# Patient Record
Sex: Female | Born: 1950 | Race: White | Marital: Married | Smoking: Never smoker
Health system: Southern US, Community
[De-identification: ages and names within clinical notes are randomized; demographics above are authoritative.]

## PROBLEM LIST (undated history)

## (undated) DIAGNOSIS — M810 Age-related osteoporosis without current pathological fracture: Secondary | ICD-10-CM

## (undated) DIAGNOSIS — F329 Major depressive disorder, single episode, unspecified: Secondary | ICD-10-CM

## (undated) DIAGNOSIS — R7303 Prediabetes: Secondary | ICD-10-CM

## (undated) DIAGNOSIS — E785 Hyperlipidemia, unspecified: Secondary | ICD-10-CM

## (undated) DIAGNOSIS — F5104 Psychophysiologic insomnia: Secondary | ICD-10-CM

## (undated) DIAGNOSIS — F32A Depression, unspecified: Secondary | ICD-10-CM

## (undated) DIAGNOSIS — K219 Gastro-esophageal reflux disease without esophagitis: Secondary | ICD-10-CM

## (undated) HISTORY — PX: TONSILLECTOMY: SUR1361

## (undated) HISTORY — DX: Gastro-esophageal reflux disease without esophagitis: K21.9

## (undated) HISTORY — DX: Hyperlipidemia, unspecified: E78.5

## (undated) HISTORY — DX: Prediabetes: R73.03

## (undated) HISTORY — DX: Age-related osteoporosis without current pathological fracture: M81.0

## (undated) HISTORY — DX: Psychophysiologic insomnia: F51.04

## (undated) HISTORY — DX: Major depressive disorder, single episode, unspecified: F32.9

## (undated) HISTORY — DX: Depression, unspecified: F32.A

---

## 1975-01-05 HISTORY — PX: TUBAL LIGATION: SHX77

## 1997-01-04 HISTORY — PX: CHOLECYSTECTOMY: SHX55

## 2003-01-05 HISTORY — PX: SHOULDER SURGERY: SHX246

## 2016-05-26 LAB — COLOGUARD: Cologuard: NEGATIVE

## 2016-08-05 DIAGNOSIS — J018 Other acute sinusitis: Secondary | ICD-10-CM | POA: Diagnosis not present

## 2016-08-13 DIAGNOSIS — K219 Gastro-esophageal reflux disease without esophagitis: Secondary | ICD-10-CM | POA: Diagnosis not present

## 2016-08-13 DIAGNOSIS — F331 Major depressive disorder, recurrent, moderate: Secondary | ICD-10-CM | POA: Diagnosis not present

## 2016-09-15 DIAGNOSIS — J018 Other acute sinusitis: Secondary | ICD-10-CM | POA: Diagnosis not present

## 2016-11-05 DIAGNOSIS — R7301 Impaired fasting glucose: Secondary | ICD-10-CM | POA: Diagnosis not present

## 2016-11-05 DIAGNOSIS — E782 Mixed hyperlipidemia: Secondary | ICD-10-CM | POA: Diagnosis not present

## 2016-11-05 DIAGNOSIS — K219 Gastro-esophageal reflux disease without esophagitis: Secondary | ICD-10-CM | POA: Diagnosis not present

## 2016-11-05 DIAGNOSIS — F331 Major depressive disorder, recurrent, moderate: Secondary | ICD-10-CM | POA: Diagnosis not present

## 2016-11-05 DIAGNOSIS — R072 Precordial pain: Secondary | ICD-10-CM | POA: Diagnosis not present

## 2016-11-17 ENCOUNTER — Ambulatory Visit (INDEPENDENT_AMBULATORY_CARE_PROVIDER_SITE_OTHER): Payer: PPO | Admitting: Cardiology

## 2016-11-17 ENCOUNTER — Encounter (INDEPENDENT_AMBULATORY_CARE_PROVIDER_SITE_OTHER): Payer: Self-pay

## 2016-11-17 ENCOUNTER — Encounter: Payer: Self-pay | Admitting: Cardiology

## 2016-11-17 VITALS — BP 122/78 | HR 66 | Ht 67.0 in | Wt 158.0 lb

## 2016-11-17 DIAGNOSIS — E782 Mixed hyperlipidemia: Secondary | ICD-10-CM | POA: Diagnosis not present

## 2016-11-17 DIAGNOSIS — Z01818 Encounter for other preprocedural examination: Secondary | ICD-10-CM

## 2016-11-17 DIAGNOSIS — R072 Precordial pain: Secondary | ICD-10-CM

## 2016-11-17 DIAGNOSIS — R079 Chest pain, unspecified: Secondary | ICD-10-CM

## 2016-11-17 HISTORY — DX: Precordial pain: R07.2

## 2016-11-17 MED ORDER — METOPROLOL TARTRATE 50 MG PO TABS: 50.0000 mg | ORAL_TABLET | Freq: Once | ORAL | 0 refills | Status: DC

## 2016-11-17 NOTE — Progress Notes (Signed)
PCP: Blane Ohara, MD  Clinic Note: Chief Complaint  Patient presents with  . New Patient (Initial Visit)    Chest pain    HPI: Virginia Montoya is a 66 y.o. female who is being seen today for the evaluation of Chest Pain at the request of Gus Height, [place]. She has a family history notable for mother with diabetes and hypercholesterol and maternal grandmother with diabetes  [name] was seen by Denice Paradise, PA for Dr. Sedalia Muta.  She noted some episodes of chest pain that woke her up at night.  She is now referred for evaluation.  Recent Hospitalizations: None  Studies Personally Reviewed - (if available, images/films reviewed: From Epic Chart or Care Everywhere)  None  Interval History: [name] presents here today with somewhat interesting symptoms of chest discomfort with 2 notable occasions, but otherwise has had some intermittent symptoms. Her initial episode was a couple weeks ago she woke up at night and with a dull ache in her chest that was about 6-7 out of 10.  The ache rate radiated into her throat and jaw.  She said she took aspirin at the time and was able to go back to sleep.  It took about 15 minutes for symptoms to fully resolve.  The next day she woke up and his symptoms came back and this time she felt the episode occurring where she felt a warm and aching sensation in her chest and it did not go to the jaw.  She has noted intermittent episodes since then that often occur at rest but can occur occur with exertion as well.  She does not necessarily notice that they get worse with exertion or walking.  She says that sometimes the symptoms can go on all day waxing and waning, but has been pretty much present throughout never fully going away.  She says that aspirin helps some, not always. It is occasionally associated with shortness of breath, but not always. She occasionally feels some irregular heartbeats with the symptoms but not otherwise. She does indicate that she has  had a significant anxiety issues and was recently started back on her Celexa. A lot of these episodes of stress and anxiety occurred while her grandkids were living with her, now they are no longer there.  Basically with her grandchildren and their young children.  That caused a lot of social stress for her.  Now that they are no longer in the house, she is feeling better.   No PND, orthopnea or edema. No palpitations, lightheadedness, dizziness, weakness or syncope/near syncope. No TIA/amaurosis fugax symptoms. No melena, hematochezia, hematuria, or epstaxis. No claudication.  ROS: A comprehensive was performed. Review of Systems  Constitutional: Negative for malaise/fatigue.  HENT: Negative for congestion.   Respiratory: Negative for cough and shortness of breath.   Cardiovascular: Positive for leg swelling (Mild swelling with mild varicose veins).  Musculoskeletal: Positive for joint pain (Routine arthritis pains).  Neurological: Negative for dizziness and loss of consciousness.  Endo/Heme/Allergies: Negative for environmental allergies.  Psychiatric/Behavioral: The patient is nervous/anxious.        Per HPI  All other systems reviewed and are negative.  I have reviewed and (if needed) personally updated the patient's problem list, medications, allergies, past medical and surgical history, social and family history.   Past Medical History:  Diagnosis Date  . Hyperlipidemia     Past Surgical History:  Procedure Laterality Date  . CHOLECYSTECTOMY  1999  . SHOULDER SURGERY  2005  .  TONSILLECTOMY    . TUBAL LIGATION  1977    Current Meds  Medication Sig  . acetaminophen (TYLENOL) 500 MG tablet Take 500 mg every 6 (six) hours as needed by mouth.  Marland Kitchen. aspirin 325 MG EC tablet Take 325 mg as needed by mouth for pain.  Marland Kitchen. atorvastatin (LIPITOR) 10 MG tablet Take 10 mg daily at 6 PM by mouth.   . citalopram (CELEXA) 20 MG tablet take one tablet by mouth daily  . ibuprofen  (ADVIL,MOTRIN) 200 MG tablet Take 200 mg every 6 (six) hours as needed by mouth.  Marland Kitchen. LORazepam (ATIVAN) 0.5 MG tablet Take 0.5 mg at bedtime by mouth.   . zolpidem (AMBIEN) 10 MG tablet take one tablet by mouth at bedtime    Allergies  Allergen Reactions  . Cephalexin Nausea And Vomiting    ask    Social History   Socioeconomic History  . Marital status: Married    Spouse name: None  . Number of children: 3  . Years of education: 2912  . Highest education level: Associate degree: academic program  Social Needs  . Financial resource strain: None  . Food insecurity - worry: None  . Food insecurity - inability: None  . Transportation needs - medical: None  . Transportation needs - non-medical: None  Occupational History  . None  Tobacco Use  . Smoking status: Never Smoker  . Smokeless tobacco: Never Used  Substance and Sexual Activity  . Alcohol use: No    Frequency: Never  . Drug use: No  . Sexual activity: None  Other Topics Concern  . None  Social History Narrative   She is a married (remarried) mother of 3 with 4 grandchildren.  She has 3 great-grandchildren.  She previously worked at [employer] in the [employer] department.  She completed high school and beauty school.   She works outside a lot in the yard on gardening.  She does a lot of walking and exercises with walking for at least a half an hour a day 5 days a week.    family history includes Diabetes in her maternal grandmother and mother; Emphysema in her father; Hyperlipidemia in her mother; Liver cancer in her maternal grandmother.  Wt Readings from Last 3 Encounters:  11/17/16 158 lb (71.7 kg)    PHYSICAL EXAM BP 122/78   Pulse 66   Ht 5\' 7"  (1.702 m)   Wt 158 lb (71.7 kg)   BMI 24.75 kg/m  Physical Exam    Adult ECG Report  Rate: 66;  Rhythm: normal sinus rhythm and Nonspecific ST and T wave changes.  Otherwise normal axis, intervals and durations.;   Narrative Interpretation: Essentially normal  EKG   Other studies Reviewed: Additional studies/ records that were reviewed today include:  Recent Labs: None available  ASSESSMENT / PLAN: Problem List Items Addressed This Visit    Chest pain with moderate risk for cardiac etiology - Primary    [place] SinkRita presents here with symptoms that are somewhat typical but otherwise atypical for angina.  The fact that they awoke her from sleep and then have occurred since it is somewhat concerning.  We discussed various options for ischemic evaluation including stress testing versus coronary CTA.  Based on her body habitus I think on a CTA may be a better study. Plan: Coronary Calcium Score followed by Coronary CTA and possible CT FFR.  This will allow us to confirm an anatomical  evaluation and also potentially evaluate physiologic evidence for ischemia.Marland Kitchen.  -  She is on aspirin as well as atorvastatin. With normal blood pressure, will hold off on considering beta-blocker or other antianginals until we see the results of her studies.      Relevant Orders   EKG 12-Lead (Completed)   CT CORONARY MORPH W/CTA COR W/SCORE W/CA W/CM &/OR WO/CM   CT CORONARY FRACTIONAL FLOW RESERVE DATA PREP   CT CORONARY FRACTIONAL FLOW RESERVE FLUID ANALYSIS   Basic metabolic panel   Hyperlipidemia, mixed (Chronic)    I do not have labs available.  She is on atorvastatin 10 mg daily.      Relevant Medications   atorvastatin (LIPITOR) 10 MG tablet   aspirin 325 MG EC tablet    Other Visit Diagnoses    Pre-op testing       Relevant Orders   Basic metabolic panel      Current medicines are reviewed at length with the patient today. (+/- concerns) none The following changes have been made:None  Patient Instructions  No medication changes    SCHEDULE AT [facility]  Your physician has requested that you have cardiac CT. Cardiac computed tomography (CT) is a painless test that uses an x-ray machine to take clear, detailed pictures of your heart. For further  information please visit [email or web address]. Please follow instruction sheet as given. PLEASE HAVE LABS- BMP AT LEAST ONE WEEK BEFORE CARDIAC TEST AND PICK UP METOPROLOL FROM PHARMACY.   Your physician recommends that you schedule a follow-up appointment in 2 MONTHS WITH DR Apryll Hinkle.        Please arrive at the [facility] main entrance of [facility] (30-45 minutes prior to test start time)  [facility] [address] [place], [place] [address] [phone]  Proceed to the [facility] Radiology Department (First Floor).  Please follow these instructions carefully (unless otherwise directed):    On the Night Before the Test: . Drink plenty of water. . Do not consume any caffeinated/decaffeinated beverages or chocolate 12 hours prior to your test. . Do not take any antihistamines 12 hours prior to your test.   On the Day of the Test: . Drink plenty of water. Do not drink any water within one hour of the test. . Do not eat any food 4 hours prior to the test. . You may take your regular medications prior to the test. . IF NOT ON A BETA BLOCKER - Take 50 mg of lopressor (metoprolol) one hour before the test.   After the Test: . Drink plenty of water. . After receiving IV contrast, you may experience a mild flushed feeling. This is normal. . On occasion, you may experience a mild rash up to 24 hours after the test. This is not dangerous. If this occurs, you can take Benadryl 25 mg and increase your fluid intake. . If you experience trouble breathing, this can be serious. If it is severe call 911 IMMEDIATELY. If it is mild, please call our office.             Studies Ordered:   Orders Placed This Encounter  Procedures  . CT CORONARY MORPH W/CTA COR W/SCORE W/CA W/CM &/OR WO/CM  . CT CORONARY FRACTIONAL FLOW RESERVE DATA PREP  . CT CORONARY FRACTIONAL FLOW RESERVE FLUID ANALYSIS  . Basic metabolic panel  . EKG 12-Lead      Bryan Lemmaavid  Audon Heymann, M.D., M.S. Interventional Cardiologist   Pager # [phone] Phone # [phone] [address] [place], [place] [address]

## 2016-11-17 NOTE — Patient Instructions (Addendum)
No medication changes    SCHEDULE AT [facility]  Your physician has requested that you have cardiac CT. Cardiac computed tomography (CT) is a painless test that uses an x-ray machine to take clear, detailed pictures of your heart. For further information please visit [email or web address]. Please follow instruction sheet as given. PLEASE HAVE LABS- BMP AT LEAST ONE WEEK BEFORE CARDIAC TEST AND PICK UP METOPROLOL FROM PHARMACY.   Your physician recommends that you schedule a follow-up appointment in 2 MONTHS WITH DR HARDING.        Please arrive at the [facility] main entrance of [facility] (30-45 minutes prior to test start time)  [facility] [address] [place], [place] [address] [phone]  Proceed to the [facility] Radiology Department (First Floor).  Please follow these instructions carefully (unless otherwise directed):    On the Night Before the Test: . Drink plenty of water. . Do not consume any caffeinated/decaffeinated beverages or chocolate 12 hours prior to your test. . Do not take any antihistamines 12 hours prior to your test.   On the Day of the Test: . Drink plenty of water. Do not drink any water within one hour of the test. . Do not eat any food 4 hours prior to the test. . You may take your regular medications prior to the test. . IF NOT ON A BETA BLOCKER - Take 50 mg of lopressor (metoprolol) one hour before the test.   After the Test: . Drink plenty of water. . After receiving IV contrast, you may experience a mild flushed feeling. This is normal. . On occasion, you may experience a mild rash up to 24 hours after the test. This is not dangerous. If this occurs, you can take Benadryl 25 mg and increase your fluid intake. . If you experience trouble breathing, this can be serious. If it is severe call 911 IMMEDIATELY. If it is mild, please call our office.

## 2016-11-20 ENCOUNTER — Encounter: Payer: Self-pay | Admitting: Cardiology

## 2016-11-20 NOTE — Assessment & Plan Note (Signed)
I do not have labs available.  She is on atorvastatin 10 mg daily.

## 2016-11-20 NOTE — Assessment & Plan Note (Addendum)
[place] SinkRita presents here with symptoms that are somewhat typical but otherwise atypical for angina.  The fact that they awoke her from sleep and then have occurred since it is somewhat concerning.  We discussed various options for ischemic evaluation including stress testing versus coronary CTA.  Based on her body habitus I think on a CTA may be a better study. Plan: Coronary Calcium Score followed by Coronary CTA and possible CT FFR.  This will allow us to confirm an anatomical  evaluation and also potentially evaluate physiologic evidence for ischemia..  -She is on aspirin as well as atorvastatin. With normal blood pressure, will hold off on considering beta-blocker or other antianginals until we see the results of her studies.

## 2016-12-07 DIAGNOSIS — J00 Acute nasopharyngitis [common cold]: Secondary | ICD-10-CM | POA: Diagnosis not present

## 2016-12-30 ENCOUNTER — Encounter: Payer: Self-pay | Admitting: *Deleted

## 2017-01-03 DIAGNOSIS — N3001 Acute cystitis with hematuria: Secondary | ICD-10-CM | POA: Diagnosis not present

## 2017-01-04 HISTORY — PX: OTHER SURGICAL HISTORY: SHX169

## 2017-01-05 DIAGNOSIS — N3001 Acute cystitis with hematuria: Secondary | ICD-10-CM | POA: Diagnosis not present

## 2017-01-10 ENCOUNTER — Ambulatory Visit: Payer: PPO | Admitting: Cardiology

## 2017-01-12 DIAGNOSIS — Z01818 Encounter for other preprocedural examination: Secondary | ICD-10-CM | POA: Diagnosis not present

## 2017-01-12 DIAGNOSIS — R079 Chest pain, unspecified: Secondary | ICD-10-CM | POA: Diagnosis not present

## 2017-01-12 LAB — BASIC METABOLIC PANEL
BUN/Creatinine Ratio: 15 (ref 12–28)
BUN: 10 mg/dL (ref 8–27)
CALCIUM: 9.2 mg/dL (ref 8.7–10.3)
CO2: 23 mmol/L (ref 20–29)
CREATININE: 0.65 mg/dL (ref 0.57–1.00)
Chloride: 102 mmol/L (ref 96–106)
GFR, EST AFRICAN AMERICAN: 105 mL/min/{1.73_m2} (ref >59)
GFR, EST NON AFRICAN AMERICAN: 92 mL/min/{1.73_m2} (ref >59)
Glucose: 126 mg/dL — ABNORMAL HIGH (ref 65–99)
POTASSIUM: 4.4 mmol/L (ref 3.5–5.2)
Sodium: 138 mmol/L (ref 134–144)

## 2017-01-20 ENCOUNTER — Ambulatory Visit (HOSPITAL_COMMUNITY)
Admission: RE | Admit: 2017-01-20 | Discharge: 2017-01-20 | Disposition: A | Discharge status: Home / Self Care | Payer: PPO | Source: Ambulatory Visit | Attending: Cardiology | Admitting: Cardiology

## 2017-01-20 ENCOUNTER — Ambulatory Visit (HOSPITAL_COMMUNITY): Admission: RE | Admit: 2017-01-20 | Payer: PPO | Source: Ambulatory Visit

## 2017-01-20 DIAGNOSIS — Z79899 Other long term (current) drug therapy: Secondary | ICD-10-CM | POA: Insufficient documentation

## 2017-01-20 DIAGNOSIS — E785 Hyperlipidemia, unspecified: Secondary | ICD-10-CM | POA: Diagnosis not present

## 2017-01-20 DIAGNOSIS — J984 Other disorders of lung: Secondary | ICD-10-CM | POA: Insufficient documentation

## 2017-01-20 DIAGNOSIS — Z7982 Long term (current) use of aspirin: Secondary | ICD-10-CM | POA: Insufficient documentation

## 2017-01-20 DIAGNOSIS — R079 Chest pain, unspecified: Secondary | ICD-10-CM

## 2017-01-20 DIAGNOSIS — Z833 Family history of diabetes mellitus: Secondary | ICD-10-CM | POA: Diagnosis not present

## 2017-01-20 MED ORDER — IOPAMIDOL (ISOVUE-370) INJECTION 76%
INTRAVENOUS | Status: AC
  Administered 2017-01-20: 100 mL
  Filled 2017-01-20: qty 100

## 2017-01-20 MED ORDER — NITROGLYCERIN 0.4 MG SL SUBL
SUBLINGUAL_TABLET | SUBLINGUAL | Status: AC
  Filled 2017-01-20: qty 2

## 2017-02-10 ENCOUNTER — Ambulatory Visit: Payer: PPO | Admitting: Cardiology

## 2017-02-10 ENCOUNTER — Encounter: Payer: Self-pay | Admitting: Cardiology

## 2017-02-10 VITALS — BP 132/72 | HR 71 | Ht 67.0 in | Wt 151.2 lb

## 2017-02-10 DIAGNOSIS — R072 Precordial pain: Secondary | ICD-10-CM

## 2017-02-10 DIAGNOSIS — E782 Mixed hyperlipidemia: Secondary | ICD-10-CM

## 2017-02-10 DIAGNOSIS — M940 Chondrocostal junction syndrome [Tietze]: Secondary | ICD-10-CM | POA: Diagnosis not present

## 2017-02-10 NOTE — Progress Notes (Signed)
PCP: Blane Ohara, MD/ Gus Height, PA  Clinic Note: Chief Complaint  Patient presents with  . Follow-up    Chest pain evaluation    HPI:  Virginia Montoya is a 67 y.o. female who is being seen today for follow-up evaluation of chest pain at the request of Cox, Fritzi Mandes, MD / Gus Height, [place]  Virginia Montoya was initially seen on November 17, 2016 for initial consultation.  She noted having 2 notable occasions of chest discomfort.  We decided to evaluate with a coronary calcium score and coronary CT angiogram.  Recent Hospitalizations: None . Studies Personally Reviewed - (if available, images/films reviewed: From Epic Chart or Care Everywhere)  CORONARY CALCIUM SCORE/CORONARY CTA: Coronary calcium score is 0.  Normal coronary origin with normal right dominance.  No evidence of CAD.  Most likely noncardiac chest pain.  Interval History: Virginia Montoya returns here today to follow-up after her evaluation in November.  She indicates that she had one more episode in December notable like the previous 2 episodes where she felt a warm discomfort across the chest as an ache.  It lasted off and on all day long and she felt quite tired that day after long day at work.  For the most part though she has not had that many other frequent symptoms.  She has had occasional episodes where she feels her heart rate go up and down, but relatively fleeting and no dizziness or lightheadedness associated with it.  She denies any exertional dyspnea, and the discomfort that she feels in her chest is not necessarily exertional.  No PND, orthopnea or edema. No palpitations, lightheadedness, dizziness, weakness or syncope/near syncope. No TIA/amaurosis fugax symptoms.  No claudication.  Review of Systems  Constitutional: Negative for diaphoresis and malaise/fatigue.  HENT: Negative for congestion and nosebleeds.   Respiratory: Negative for cough and shortness of breath.   Cardiovascular:       Per HPI    Gastrointestinal: Negative for blood in stool and melena.  Genitourinary: Negative for hematuria.  Musculoskeletal: Negative for falls and joint pain.    I have reviewed and (if needed) personally updated the patient's problem list, medications, allergies, past medical and surgical history, social and family history.   Past Medical History:  Diagnosis Date  . Chronic insomnia   . Depression   . Gastroesophageal reflux disease   . Hyperlipidemia   . Prediabetes     Past Surgical History:  Procedure Laterality Date  . CHOLECYSTECTOMY  1999  . CORONARY CALCIUM SCORE/CORONARY CTA  01/2017   Coronary calcium score is 0.  Normal coronary origin with normal right dominance.  No evidence of CAD.  Most likely noncardiac chest pain.  Virginia Montoya Kitchen SHOULDER SURGERY  2005  . TONSILLECTOMY    . TUBAL LIGATION  1977    Current Meds  Medication Sig  . acetaminophen (TYLENOL) 500 MG tablet Take 500 mg every 6 (six) hours as needed by mouth.  Virginia Montoya Kitchen aspirin 325 MG EC tablet Take 325 mg as needed by mouth for pain.  Virginia Montoya Kitchen atorvastatin (LIPITOR) 10 MG tablet Take 10 mg daily at 6 PM by mouth.   . citalopram (CELEXA) 20 MG tablet take one tablet by mouth daily  . ibuprofen (ADVIL,MOTRIN) 200 MG tablet Take 200 mg every 6 (six) hours as needed by mouth.  Virginia Montoya Kitchen LORazepam (ATIVAN) 0.5 MG tablet Take 0.5 mg at bedtime by mouth.   Virginia Montoya Kitchen omeprazole (PRILOSEC) 20 MG capsule Take 20 mg by mouth.  . zolpidem (AMBIEN)  10 MG tablet take one tablet by mouth at bedtime    Allergies  Allergen Reactions  . Cephalexin Nausea And Vomiting    ask  . Biaxin [Clarithromycin] Other (See Comments)    CHEST PAIN    Social History   Tobacco Use  . Smoking status: Never Smoker  . Smokeless tobacco: Never Used  Substance Use Topics  . Alcohol use: No    Frequency: Never  . Drug use: No   Social History   Social History Narrative   She is a married (remarried) mother of 3 with 4 grandchildren.  She has 3 great-grandchildren.  She  previously worked at [employer] in the [employer].  She completed high school and beauty school.   She works outside a lot in the yard on gardening.  She does a lot of walking and exercises with walking for at least a half an hour a day 5 days a week.    family history includes Diabetes in her maternal grandmother and mother; Emphysema in her father; Hyperlipidemia in her mother; Liver cancer in her maternal grandmother.  Wt Readings from Last 3 Encounters:  02/10/17 151 lb 3.2 oz (68.6 kg)  11/17/16 158 lb (71.7 kg)    PHYSICAL EXAM BP 132/72   Pulse 71   Ht 5\' 7"  (1.702 m)   Wt 151 lb 3.2 oz (68.6 kg)   SpO2 96%   BMI 23.68 kg/m  Physical Exam  Constitutional: She is oriented to person, place, and time. She appears well-developed and well-nourished. No distress.  HENT:  Head: Normocephalic and atraumatic.  Neck: No hepatojugular reflux and no JVD present. Carotid bruit is not present.  Cardiovascular: Normal rate, regular rhythm, normal heart sounds and intact distal pulses.  No extrasystoles are present. PMI is not displaced. Exam reveals no gallop and no friction rub.  No murmur heard. Pulmonary/Chest: Effort normal and breath sounds normal. No respiratory distress. She has no wheezes. She exhibits tenderness (reporoducible point tenderness along the sternal border.).  Musculoskeletal: Normal range of motion. She exhibits no edema.  Neurological: She is alert and oriented to person, place, and time.  Psychiatric: Her behavior is normal. Judgment and thought content normal.  Nursing note and vitals reviewed.    Adult ECG Report Not done   Other studies Reviewed: Additional studies/ records that were reviewed today include:  Recent Labs:   Lab Results  Component Value Date   CREATININE 0.65 01/12/2017   BUN 10 01/12/2017   NA 138 01/12/2017   K 4.4 01/12/2017   CL 102 01/12/2017   CO2 23 01/12/2017   No results found for: CHOL, HDL, LDLCALC, LDLDIRECT, TRIG,  CHOLHDL  ASSESSMENT / PLAN: Problem List Items Addressed This Visit    Costochondritis - Primary    I suspect her symptoms are somewhat muscular skeletal related to costochondritis.  There is point tenderness along sternal border.  I explained to her what this means.  She indicates actually that her chiropractor had suggested this is a possibility as well but used a different name.  Recommend use of NSAIDs versus Tylenol.  If symptoms worsen, could do steroid taper.      Hyperlipidemia, mixed (Chronic)    Remains on atorvastatin.  Would shoot for goal of roughly 100 for LDL.      Precordial chest pain    Reproducible pain.  Low likely to be cardiac in nature.  Essentially normal coronary CTA with very low risk coronary calcium score.  This would argue  against significant CAD as an etiology. Consider musculoskeletal pain.         Current medicines are reviewed at length with the patient today. (+/- concerns) none The following changes have been made: none  Patient Instructions  NO CHANGE WITH CURRENT TREATMENT.    Your physician recommends that you schedule a follow-up appointment on an as needed basis.    Costochondritis Costochondritis is swelling and irritation (inflammation) of the tissue (cartilage) that connects your ribs to your breastbone (sternum). This causes pain in the front of your chest. The pain usually starts gradually and involves more than one rib. What are the causes? The exact cause of this condition is not always known. It results from stress on the cartilage where your ribs attach to your sternum. The cause of this stress could be:  Chest injury (trauma).  Exercise or activity, such as lifting.  Severe coughing.   Studies Ordered:   No orders of the defined types were placed in this encounter.     Bryan Lemmaavid Manan Olmo, M.D., M.S. Interventional Cardiologist   Pager # [phone] Phone # [phone] [address] [place],  [place] [address]   Thank you for choosing [facility] at [facility]!!

## 2017-02-10 NOTE — Patient Instructions (Signed)
NO CHANGE WITH CURRENT TREATMENT.    Your physician recommends that you schedule a follow-up appointment on an as needed basis.    Costochondritis Costochondritis is swelling and irritation (inflammation) of the tissue (cartilage) that connects your ribs to your breastbone (sternum). This causes pain in the front of your chest. The pain usually starts gradually and involves more than one rib. What are the causes? The exact cause of this condition is not always known. It results from stress on the cartilage where your ribs attach to your sternum. The cause of this stress could be:  Chest injury (trauma).  Exercise or activity, such as lifting.  Severe coughing.  What increases the risk? You may be at higher risk for this condition if you:  Are female.  Are 7230?69 years old.  Recently started a new exercise or work activity.  Have low levels of vitamin D.  Have a condition that makes you cough frequently.  What are the signs or symptoms? The main symptom of this condition is chest pain. The pain:  Usually starts gradually and can be sharp or dull.  Gets worse with deep breathing, coughing, or exercise.  Gets better with rest.  May be worse when you press on the sternum-rib connection (tenderness).  How is this diagnosed? This condition is diagnosed based on your symptoms, medical history, and a physical exam. Your health care provider will check for tenderness when pressing on your sternum. This is the most important finding. You may also have tests to rule out other causes of chest pain. These may include:  A chest X-ray to check for lung problems.  An electrocardiogram (ECG) to see if you have a heart problem that could be causing the pain.  An imaging scan to rule out a chest or rib fracture.  How is this treated? This condition usually goes away on its own over time. Your health care provider may prescribe an NSAID to reduce pain and inflammation. Your health  care provider may also suggest that you:  Rest and avoid activities that make pain worse.  Apply heat or cold to the area to reduce pain and inflammation.  Do exercises to stretch your chest muscles.  If these treatments do not help, your health care provider may inject a numbing medicine at the sternum-rib connection to help relieve the pain. Follow these instructions at home:  Avoid activities that make pain worse. This includes any activities that use chest, abdominal, and side muscles.  If directed, put ice on the painful area: ? Put ice in a plastic bag. ? Place a towel between your skin and the bag. ? Leave the ice on for 20 minutes, 2-3 times a day.  If directed, apply heat to the affected area as often as told by your health care provider. Use the heat source that your health care provider recommends, such as a moist heat pack or a heating pad. ? Place a towel between your skin and the heat source. ? Leave the heat on for 20-30 minutes. ? Remove the heat if your skin turns bright red. This is especially important if you are unable to feel pain, heat, or cold. You may have a greater risk of getting burned.  Take over-the-counter and prescription medicines only as told by your health care provider.  Return to your normal activities as told by your health care provider. Ask your health care provider what activities are safe for you.  Keep all follow-up visits as told by your  health care provider. This is important. Contact a health care provider if:  You have chills or a fever.  Your pain does not go away or it gets worse.  You have a cough that does not go away (is persistent). Get help right away if:  You have shortness of breath. This information is not intended to replace advice given to you by your health care provider. Make sure you discuss any questions you have with your health care provider. Document Released: 09/30/2004 Document Revised: 07/11/2015 Document  Reviewed: 04/16/2015 Elsevier Interactive Patient Education  [employer].

## 2017-02-12 ENCOUNTER — Encounter: Payer: Self-pay | Admitting: Cardiology

## 2017-02-12 NOTE — Assessment & Plan Note (Signed)
I suspect her symptoms are somewhat muscular skeletal related to costochondritis.  There is point tenderness along sternal border.  I explained to her what this means.  She indicates actually that her chiropractor had suggested this is a possibility as well but used a different name.  Recommend use of NSAIDs versus Tylenol.  If symptoms worsen, could do steroid taper.

## 2017-02-12 NOTE — Assessment & Plan Note (Signed)
Remains on atorvastatin.  Would shoot for goal of roughly 100 for LDL.

## 2017-02-12 NOTE — Assessment & Plan Note (Signed)
Reproducible pain.  Low likely to be cardiac in nature.  Essentially normal coronary CTA with very low risk coronary calcium score.  This would argue against significant CAD as an etiology. Consider musculoskeletal pain.

## 2017-06-09 DIAGNOSIS — K219 Gastro-esophageal reflux disease without esophagitis: Secondary | ICD-10-CM | POA: Diagnosis not present

## 2017-06-09 DIAGNOSIS — E782 Mixed hyperlipidemia: Secondary | ICD-10-CM | POA: Diagnosis not present

## 2017-06-09 DIAGNOSIS — Z6823 Body mass index (BMI) 23.0-23.9, adult: Secondary | ICD-10-CM | POA: Diagnosis not present

## 2017-06-09 DIAGNOSIS — R7301 Impaired fasting glucose: Secondary | ICD-10-CM | POA: Diagnosis not present

## 2017-06-09 DIAGNOSIS — F331 Major depressive disorder, recurrent, moderate: Secondary | ICD-10-CM | POA: Diagnosis not present

## 2017-06-09 DIAGNOSIS — Z1231 Encounter for screening mammogram for malignant neoplasm of breast: Secondary | ICD-10-CM | POA: Diagnosis not present

## 2017-06-24 DIAGNOSIS — Z1231 Encounter for screening mammogram for malignant neoplasm of breast: Secondary | ICD-10-CM | POA: Diagnosis not present

## 2017-06-24 LAB — HM MAMMOGRAPHY: HM Mammogram: NORMAL (ref 0–4)

## 2017-12-09 DIAGNOSIS — E782 Mixed hyperlipidemia: Secondary | ICD-10-CM | POA: Diagnosis not present

## 2017-12-09 DIAGNOSIS — F331 Major depressive disorder, recurrent, moderate: Secondary | ICD-10-CM | POA: Diagnosis not present

## 2017-12-09 DIAGNOSIS — M810 Age-related osteoporosis without current pathological fracture: Secondary | ICD-10-CM | POA: Diagnosis not present

## 2017-12-09 DIAGNOSIS — Z0001 Encounter for general adult medical examination with abnormal findings: Secondary | ICD-10-CM | POA: Diagnosis not present

## 2017-12-09 DIAGNOSIS — R7301 Impaired fasting glucose: Secondary | ICD-10-CM | POA: Diagnosis not present

## 2017-12-09 DIAGNOSIS — K219 Gastro-esophageal reflux disease without esophagitis: Secondary | ICD-10-CM | POA: Diagnosis not present

## 2017-12-22 DIAGNOSIS — J00 Acute nasopharyngitis [common cold]: Secondary | ICD-10-CM | POA: Diagnosis not present

## 2018-01-17 DIAGNOSIS — M6701 Short Achilles tendon (acquired), right ankle: Secondary | ICD-10-CM | POA: Diagnosis not present

## 2018-01-17 DIAGNOSIS — M722 Plantar fascial fibromatosis: Secondary | ICD-10-CM | POA: Diagnosis not present

## 2018-01-17 DIAGNOSIS — M6240 Contracture of muscle, unspecified site: Secondary | ICD-10-CM | POA: Insufficient documentation

## 2018-01-17 DIAGNOSIS — M6702 Short Achilles tendon (acquired), left ankle: Secondary | ICD-10-CM | POA: Diagnosis not present

## 2018-02-07 DIAGNOSIS — M722 Plantar fascial fibromatosis: Secondary | ICD-10-CM | POA: Diagnosis not present

## 2018-02-07 DIAGNOSIS — M6702 Short Achilles tendon (acquired), left ankle: Secondary | ICD-10-CM | POA: Diagnosis not present

## 2018-02-07 DIAGNOSIS — M6701 Short Achilles tendon (acquired), right ankle: Secondary | ICD-10-CM | POA: Diagnosis not present

## 2018-02-07 DIAGNOSIS — S93402A Sprain of unspecified ligament of left ankle, initial encounter: Secondary | ICD-10-CM | POA: Diagnosis not present

## 2018-03-06 DIAGNOSIS — H353130 Nonexudative age-related macular degeneration, bilateral, stage unspecified: Secondary | ICD-10-CM | POA: Diagnosis not present

## 2018-03-10 DIAGNOSIS — H353130 Nonexudative age-related macular degeneration, bilateral, stage unspecified: Secondary | ICD-10-CM | POA: Diagnosis not present

## 2018-03-10 DIAGNOSIS — H353220 Exudative age-related macular degeneration, left eye, stage unspecified: Secondary | ICD-10-CM | POA: Diagnosis not present

## 2018-03-23 DIAGNOSIS — M6702 Short Achilles tendon (acquired), left ankle: Secondary | ICD-10-CM | POA: Diagnosis not present

## 2018-03-23 DIAGNOSIS — M6701 Short Achilles tendon (acquired), right ankle: Secondary | ICD-10-CM | POA: Diagnosis not present

## 2018-03-23 DIAGNOSIS — M722 Plantar fascial fibromatosis: Secondary | ICD-10-CM | POA: Diagnosis not present

## 2018-04-21 DIAGNOSIS — N3001 Acute cystitis with hematuria: Secondary | ICD-10-CM | POA: Diagnosis not present

## 2018-05-04 DIAGNOSIS — H353133 Nonexudative age-related macular degeneration, bilateral, advanced atrophic without subfoveal involvement: Secondary | ICD-10-CM | POA: Diagnosis not present

## 2018-05-04 DIAGNOSIS — H2513 Age-related nuclear cataract, bilateral: Secondary | ICD-10-CM | POA: Diagnosis not present

## 2018-05-23 DIAGNOSIS — L03032 Cellulitis of left toe: Secondary | ICD-10-CM | POA: Diagnosis not present

## 2018-05-23 DIAGNOSIS — G609 Hereditary and idiopathic neuropathy, unspecified: Secondary | ICD-10-CM | POA: Diagnosis not present

## 2018-05-23 DIAGNOSIS — M722 Plantar fascial fibromatosis: Secondary | ICD-10-CM | POA: Diagnosis not present

## 2018-05-30 DIAGNOSIS — M722 Plantar fascial fibromatosis: Secondary | ICD-10-CM | POA: Diagnosis not present

## 2018-06-06 DIAGNOSIS — H353130 Nonexudative age-related macular degeneration, bilateral, stage unspecified: Secondary | ICD-10-CM | POA: Diagnosis not present

## 2018-06-06 DIAGNOSIS — H2513 Age-related nuclear cataract, bilateral: Secondary | ICD-10-CM | POA: Diagnosis not present

## 2018-06-15 DIAGNOSIS — R202 Paresthesia of skin: Secondary | ICD-10-CM | POA: Diagnosis not present

## 2018-06-15 DIAGNOSIS — E782 Mixed hyperlipidemia: Secondary | ICD-10-CM | POA: Diagnosis not present

## 2018-06-15 DIAGNOSIS — K219 Gastro-esophageal reflux disease without esophagitis: Secondary | ICD-10-CM | POA: Diagnosis not present

## 2018-06-15 DIAGNOSIS — R7303 Prediabetes: Secondary | ICD-10-CM | POA: Diagnosis not present

## 2018-06-15 DIAGNOSIS — F331 Major depressive disorder, recurrent, moderate: Secondary | ICD-10-CM | POA: Diagnosis not present

## 2018-06-16 DIAGNOSIS — R202 Paresthesia of skin: Secondary | ICD-10-CM | POA: Diagnosis not present

## 2018-06-16 DIAGNOSIS — R7303 Prediabetes: Secondary | ICD-10-CM | POA: Diagnosis not present

## 2018-06-16 DIAGNOSIS — E782 Mixed hyperlipidemia: Secondary | ICD-10-CM | POA: Diagnosis not present

## 2018-07-04 DIAGNOSIS — M722 Plantar fascial fibromatosis: Secondary | ICD-10-CM | POA: Diagnosis not present

## 2018-07-04 DIAGNOSIS — M6702 Short Achilles tendon (acquired), left ankle: Secondary | ICD-10-CM | POA: Diagnosis not present

## 2018-07-05 DIAGNOSIS — M79672 Pain in left foot: Secondary | ICD-10-CM | POA: Diagnosis not present

## 2018-07-05 DIAGNOSIS — M2560 Stiffness of unspecified joint, not elsewhere classified: Secondary | ICD-10-CM | POA: Diagnosis not present

## 2018-07-05 DIAGNOSIS — M6281 Muscle weakness (generalized): Secondary | ICD-10-CM | POA: Diagnosis not present

## 2018-07-11 DIAGNOSIS — M79672 Pain in left foot: Secondary | ICD-10-CM | POA: Diagnosis not present

## 2018-07-11 DIAGNOSIS — M2560 Stiffness of unspecified joint, not elsewhere classified: Secondary | ICD-10-CM | POA: Diagnosis not present

## 2018-07-11 DIAGNOSIS — M6281 Muscle weakness (generalized): Secondary | ICD-10-CM | POA: Diagnosis not present

## 2018-07-13 DIAGNOSIS — M6281 Muscle weakness (generalized): Secondary | ICD-10-CM | POA: Diagnosis not present

## 2018-07-13 DIAGNOSIS — M79672 Pain in left foot: Secondary | ICD-10-CM | POA: Diagnosis not present

## 2018-07-13 DIAGNOSIS — M2560 Stiffness of unspecified joint, not elsewhere classified: Secondary | ICD-10-CM | POA: Diagnosis not present

## 2018-07-17 DIAGNOSIS — M79672 Pain in left foot: Secondary | ICD-10-CM | POA: Diagnosis not present

## 2018-07-17 DIAGNOSIS — M2560 Stiffness of unspecified joint, not elsewhere classified: Secondary | ICD-10-CM | POA: Diagnosis not present

## 2018-07-17 DIAGNOSIS — M6281 Muscle weakness (generalized): Secondary | ICD-10-CM | POA: Diagnosis not present

## 2018-07-20 DIAGNOSIS — M2560 Stiffness of unspecified joint, not elsewhere classified: Secondary | ICD-10-CM | POA: Diagnosis not present

## 2018-07-20 DIAGNOSIS — M79672 Pain in left foot: Secondary | ICD-10-CM | POA: Diagnosis not present

## 2018-07-20 DIAGNOSIS — M6281 Muscle weakness (generalized): Secondary | ICD-10-CM | POA: Diagnosis not present

## 2018-07-20 DIAGNOSIS — I10 Essential (primary) hypertension: Secondary | ICD-10-CM | POA: Diagnosis not present

## 2018-07-24 DIAGNOSIS — I10 Essential (primary) hypertension: Secondary | ICD-10-CM | POA: Diagnosis not present

## 2018-07-24 DIAGNOSIS — M79672 Pain in left foot: Secondary | ICD-10-CM | POA: Diagnosis not present

## 2018-07-24 DIAGNOSIS — M2560 Stiffness of unspecified joint, not elsewhere classified: Secondary | ICD-10-CM | POA: Diagnosis not present

## 2018-07-24 DIAGNOSIS — M6281 Muscle weakness (generalized): Secondary | ICD-10-CM | POA: Diagnosis not present

## 2018-07-27 DIAGNOSIS — M6281 Muscle weakness (generalized): Secondary | ICD-10-CM | POA: Diagnosis not present

## 2018-07-27 DIAGNOSIS — M79672 Pain in left foot: Secondary | ICD-10-CM | POA: Diagnosis not present

## 2018-07-27 DIAGNOSIS — M2560 Stiffness of unspecified joint, not elsewhere classified: Secondary | ICD-10-CM | POA: Diagnosis not present

## 2018-07-31 DIAGNOSIS — M6281 Muscle weakness (generalized): Secondary | ICD-10-CM | POA: Diagnosis not present

## 2018-07-31 DIAGNOSIS — M2560 Stiffness of unspecified joint, not elsewhere classified: Secondary | ICD-10-CM | POA: Diagnosis not present

## 2018-07-31 DIAGNOSIS — M79672 Pain in left foot: Secondary | ICD-10-CM | POA: Diagnosis not present

## 2018-08-03 DIAGNOSIS — M2560 Stiffness of unspecified joint, not elsewhere classified: Secondary | ICD-10-CM | POA: Diagnosis not present

## 2018-08-03 DIAGNOSIS — M79672 Pain in left foot: Secondary | ICD-10-CM | POA: Diagnosis not present

## 2018-08-03 DIAGNOSIS — M6281 Muscle weakness (generalized): Secondary | ICD-10-CM | POA: Diagnosis not present

## 2018-08-07 DIAGNOSIS — M79672 Pain in left foot: Secondary | ICD-10-CM | POA: Diagnosis not present

## 2018-08-07 DIAGNOSIS — M6281 Muscle weakness (generalized): Secondary | ICD-10-CM | POA: Diagnosis not present

## 2018-08-07 DIAGNOSIS — M2560 Stiffness of unspecified joint, not elsewhere classified: Secondary | ICD-10-CM | POA: Diagnosis not present

## 2018-08-10 DIAGNOSIS — M79672 Pain in left foot: Secondary | ICD-10-CM | POA: Diagnosis not present

## 2018-08-10 DIAGNOSIS — M2560 Stiffness of unspecified joint, not elsewhere classified: Secondary | ICD-10-CM | POA: Diagnosis not present

## 2018-08-10 DIAGNOSIS — M6281 Muscle weakness (generalized): Secondary | ICD-10-CM | POA: Diagnosis not present

## 2018-08-14 DIAGNOSIS — M2560 Stiffness of unspecified joint, not elsewhere classified: Secondary | ICD-10-CM | POA: Diagnosis not present

## 2018-08-14 DIAGNOSIS — M79672 Pain in left foot: Secondary | ICD-10-CM | POA: Diagnosis not present

## 2018-08-14 DIAGNOSIS — M6281 Muscle weakness (generalized): Secondary | ICD-10-CM | POA: Diagnosis not present

## 2018-08-15 DIAGNOSIS — M722 Plantar fascial fibromatosis: Secondary | ICD-10-CM | POA: Diagnosis not present

## 2018-08-15 DIAGNOSIS — M6702 Short Achilles tendon (acquired), left ankle: Secondary | ICD-10-CM | POA: Diagnosis not present

## 2018-08-17 DIAGNOSIS — M2560 Stiffness of unspecified joint, not elsewhere classified: Secondary | ICD-10-CM | POA: Diagnosis not present

## 2018-08-17 DIAGNOSIS — M79672 Pain in left foot: Secondary | ICD-10-CM | POA: Diagnosis not present

## 2018-08-17 DIAGNOSIS — M6281 Muscle weakness (generalized): Secondary | ICD-10-CM | POA: Diagnosis not present

## 2018-08-21 DIAGNOSIS — M79672 Pain in left foot: Secondary | ICD-10-CM | POA: Diagnosis not present

## 2018-08-21 DIAGNOSIS — M2560 Stiffness of unspecified joint, not elsewhere classified: Secondary | ICD-10-CM | POA: Diagnosis not present

## 2018-08-21 DIAGNOSIS — M6281 Muscle weakness (generalized): Secondary | ICD-10-CM | POA: Diagnosis not present

## 2018-08-24 DIAGNOSIS — M79672 Pain in left foot: Secondary | ICD-10-CM | POA: Diagnosis not present

## 2018-08-24 DIAGNOSIS — M6281 Muscle weakness (generalized): Secondary | ICD-10-CM | POA: Diagnosis not present

## 2018-08-24 DIAGNOSIS — M2560 Stiffness of unspecified joint, not elsewhere classified: Secondary | ICD-10-CM | POA: Diagnosis not present

## 2018-08-28 DIAGNOSIS — M2560 Stiffness of unspecified joint, not elsewhere classified: Secondary | ICD-10-CM | POA: Diagnosis not present

## 2018-08-28 DIAGNOSIS — M6281 Muscle weakness (generalized): Secondary | ICD-10-CM | POA: Diagnosis not present

## 2018-08-28 DIAGNOSIS — M79672 Pain in left foot: Secondary | ICD-10-CM | POA: Diagnosis not present

## 2018-08-31 DIAGNOSIS — M79672 Pain in left foot: Secondary | ICD-10-CM | POA: Diagnosis not present

## 2018-08-31 DIAGNOSIS — M2560 Stiffness of unspecified joint, not elsewhere classified: Secondary | ICD-10-CM | POA: Diagnosis not present

## 2018-08-31 DIAGNOSIS — M6281 Muscle weakness (generalized): Secondary | ICD-10-CM | POA: Diagnosis not present

## 2018-09-04 DIAGNOSIS — N3000 Acute cystitis without hematuria: Secondary | ICD-10-CM | POA: Diagnosis not present

## 2018-09-04 DIAGNOSIS — J069 Acute upper respiratory infection, unspecified: Secondary | ICD-10-CM | POA: Diagnosis not present

## 2018-09-07 DIAGNOSIS — M79672 Pain in left foot: Secondary | ICD-10-CM | POA: Diagnosis not present

## 2018-09-07 DIAGNOSIS — M6281 Muscle weakness (generalized): Secondary | ICD-10-CM | POA: Diagnosis not present

## 2018-09-07 DIAGNOSIS — M2560 Stiffness of unspecified joint, not elsewhere classified: Secondary | ICD-10-CM | POA: Diagnosis not present

## 2018-09-14 DIAGNOSIS — M79672 Pain in left foot: Secondary | ICD-10-CM | POA: Diagnosis not present

## 2018-09-14 DIAGNOSIS — M6281 Muscle weakness (generalized): Secondary | ICD-10-CM | POA: Diagnosis not present

## 2018-09-14 DIAGNOSIS — M2560 Stiffness of unspecified joint, not elsewhere classified: Secondary | ICD-10-CM | POA: Diagnosis not present

## 2018-09-21 DIAGNOSIS — M2560 Stiffness of unspecified joint, not elsewhere classified: Secondary | ICD-10-CM | POA: Diagnosis not present

## 2018-09-21 DIAGNOSIS — M6281 Muscle weakness (generalized): Secondary | ICD-10-CM | POA: Diagnosis not present

## 2018-09-21 DIAGNOSIS — M79672 Pain in left foot: Secondary | ICD-10-CM | POA: Diagnosis not present

## 2018-09-26 DIAGNOSIS — M25562 Pain in left knee: Secondary | ICD-10-CM | POA: Diagnosis not present

## 2018-09-26 DIAGNOSIS — E782 Mixed hyperlipidemia: Secondary | ICD-10-CM | POA: Diagnosis not present

## 2018-09-26 DIAGNOSIS — F5101 Primary insomnia: Secondary | ICD-10-CM | POA: Diagnosis not present

## 2018-09-26 DIAGNOSIS — M25542 Pain in joints of left hand: Secondary | ICD-10-CM | POA: Diagnosis not present

## 2018-09-26 DIAGNOSIS — M25551 Pain in right hip: Secondary | ICD-10-CM | POA: Diagnosis not present

## 2018-09-26 DIAGNOSIS — K219 Gastro-esophageal reflux disease without esophagitis: Secondary | ICD-10-CM | POA: Diagnosis not present

## 2018-09-26 DIAGNOSIS — F331 Major depressive disorder, recurrent, moderate: Secondary | ICD-10-CM | POA: Diagnosis not present

## 2018-09-26 DIAGNOSIS — Z23 Encounter for immunization: Secondary | ICD-10-CM | POA: Diagnosis not present

## 2018-09-26 DIAGNOSIS — R7303 Prediabetes: Secondary | ICD-10-CM | POA: Diagnosis not present

## 2018-09-28 DIAGNOSIS — M79672 Pain in left foot: Secondary | ICD-10-CM | POA: Diagnosis not present

## 2018-09-28 DIAGNOSIS — M2560 Stiffness of unspecified joint, not elsewhere classified: Secondary | ICD-10-CM | POA: Diagnosis not present

## 2018-09-28 DIAGNOSIS — M6281 Muscle weakness (generalized): Secondary | ICD-10-CM | POA: Diagnosis not present

## 2018-10-03 DIAGNOSIS — M6702 Short Achilles tendon (acquired), left ankle: Secondary | ICD-10-CM | POA: Diagnosis not present

## 2018-10-03 DIAGNOSIS — M6701 Short Achilles tendon (acquired), right ankle: Secondary | ICD-10-CM | POA: Diagnosis not present

## 2018-10-03 DIAGNOSIS — M722 Plantar fascial fibromatosis: Secondary | ICD-10-CM | POA: Diagnosis not present

## 2018-10-06 DIAGNOSIS — M6281 Muscle weakness (generalized): Secondary | ICD-10-CM | POA: Diagnosis not present

## 2018-10-06 DIAGNOSIS — M79645 Pain in left finger(s): Secondary | ICD-10-CM | POA: Diagnosis not present

## 2018-10-06 DIAGNOSIS — M25642 Stiffness of left hand, not elsewhere classified: Secondary | ICD-10-CM | POA: Diagnosis not present

## 2018-10-09 DIAGNOSIS — M79645 Pain in left finger(s): Secondary | ICD-10-CM | POA: Diagnosis not present

## 2018-10-09 DIAGNOSIS — M25642 Stiffness of left hand, not elsewhere classified: Secondary | ICD-10-CM | POA: Diagnosis not present

## 2018-10-09 DIAGNOSIS — M6281 Muscle weakness (generalized): Secondary | ICD-10-CM | POA: Diagnosis not present

## 2018-10-12 DIAGNOSIS — M6281 Muscle weakness (generalized): Secondary | ICD-10-CM | POA: Diagnosis not present

## 2018-10-12 DIAGNOSIS — M79645 Pain in left finger(s): Secondary | ICD-10-CM | POA: Diagnosis not present

## 2018-10-12 DIAGNOSIS — M25642 Stiffness of left hand, not elsewhere classified: Secondary | ICD-10-CM | POA: Diagnosis not present

## 2018-10-17 DIAGNOSIS — M79645 Pain in left finger(s): Secondary | ICD-10-CM | POA: Diagnosis not present

## 2018-10-17 DIAGNOSIS — M6281 Muscle weakness (generalized): Secondary | ICD-10-CM | POA: Diagnosis not present

## 2018-10-17 DIAGNOSIS — M25642 Stiffness of left hand, not elsewhere classified: Secondary | ICD-10-CM | POA: Diagnosis not present

## 2018-10-26 DIAGNOSIS — M79645 Pain in left finger(s): Secondary | ICD-10-CM | POA: Diagnosis not present

## 2018-10-26 DIAGNOSIS — M6281 Muscle weakness (generalized): Secondary | ICD-10-CM | POA: Diagnosis not present

## 2018-10-26 DIAGNOSIS — M25642 Stiffness of left hand, not elsewhere classified: Secondary | ICD-10-CM | POA: Diagnosis not present

## 2018-10-29 IMAGING — CT CT HEART MORP W/ CTA COR W/ SCORE W/ CA W/CM &/OR W/O CM
2 of 8 series · 4 of 20 positions shown, 5 images · IV contrast (APPLIED)
Comparison: None.

EXAM:
OVER-READ INTERPRETATION  CT CHEST

The following report is an over-read performed by radiologist Dr.
over-read does not include interpretation of cardiac or coronary
anatomy or pathology. The coronary calcium score and cardiac CTA
interpretation by the cardiologist is attached.
CLINICAL DATA: 66-year-old female with DM, HTN and chest pain.
Cardiac/Coronary  CT
TECHNIQUE: The patient was scanned on a Phillips Force scanner.

[Series 9: ts syst sharp 37 % · axial · 0.30mm/px · z∈[+1172,+1217]mm · 2 of 341 slices shown]
[im 114/341  lung]
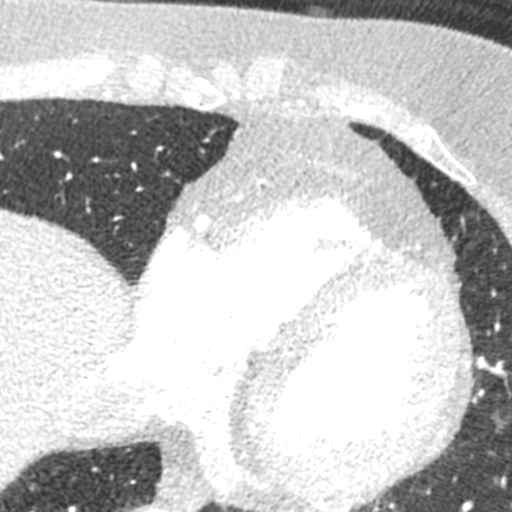
[im 227/341  lung]
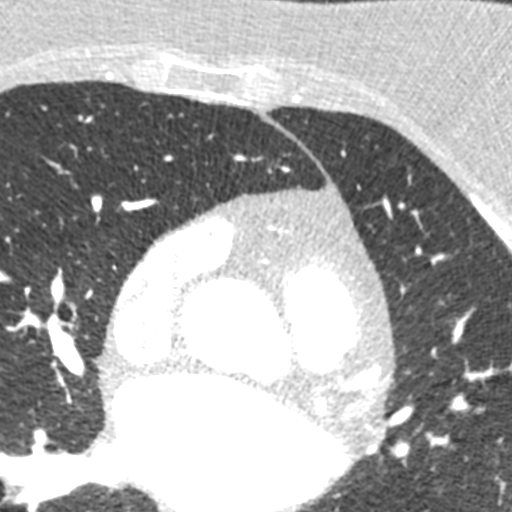

[Series 13: best diast 73 % · axial · 0.34mm/px · z∈[+1172,+1217]mm · 2 of 341 slices shown, 3 images]
[im 114/341  vessel]
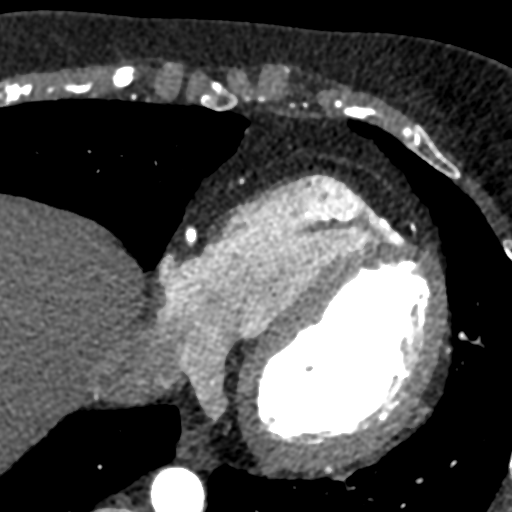
[im 114/341  lung]
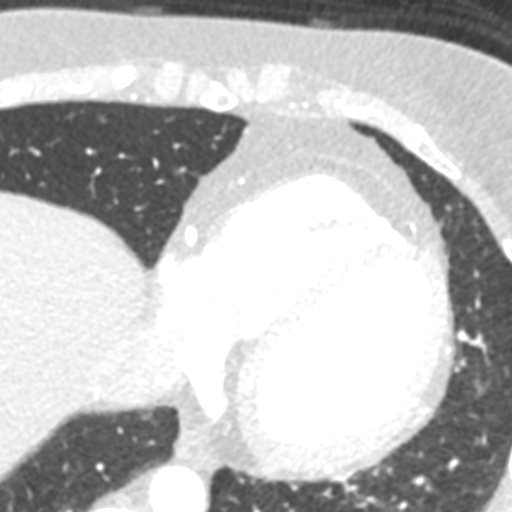
[im 227/341  vessel]
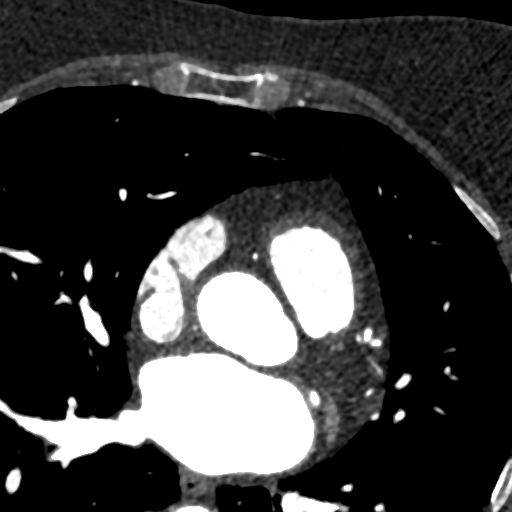

[4 of 20 positions shown; findings below may reference images not displayed]

FINDINGS: Mild scarring in the left lower lobe. Within the visualized portions
of the thorax there are no suspicious appearing pulmonary nodules or
masses, there is no acute consolidative airspace disease, no pleural
effusions, no pneumothorax and no lymphadenopathy. Visualized
portions of the upper abdomen are unremarkable. There are no
aggressive appearing lytic or blastic lesions noted in the
visualized portions of the skeleton.
IMPRESSION: 1. No significant incidental noncardiac findings are noted.
FINDINGS: A 120 kV prospective scan was triggered in the descending thoracic
aorta at 111 HU's. Axial non-contrast 3 mm slices were carried out
through the heart. The data set was analyzed on a dedicated work
station and scored using the Agatson method. Gantry rotation speed
was 250 msecs and collimation was .6 mm. No beta blockade and 0.8 mg
of sl NTG was given. The 3D data set was reconstructed in 5%
intervals of the 67-82 % of the R-R cycle. Diastolic phases were
analyzed on a dedicated work station using MPR, MIP and VRT modes.
The patient received 80 cc of contrast.

Aorta:  Normal size.  No calcifications.  No dissection.

Aortic Valve:  Trileaflet.  No calcifications.

Coronary Arteries:  Normal coronary origin.  Right dominance.

RCA is a very large dominant artery that gives rise to PDA and PLVB.
There is no plaque.

Left main is a large artery that gives rise to LAD, ramus
intermedius and LCX arteries. Left main has no plaque.

LAD is a large vessel that gives rise to two diagonal arteries and
has no plaque.

RI is a very small artery that has no plaque.

LCX is a medium size non-dominant artery that gives rise to one OM1
branch. There is no plaque.

Other findings:

Normal pulmonary vein drainage into the left atrium.

Normal let atrial appendage without a thrombus.

Normal size of the pulmonary artery.
IMPRESSION: 1. Coronary calcium score of 0. This was 0 percentile for age and
sex matched control.

2. Normal coronary origin with right dominance.

3. No evidence of CAD.  Consider non-cardiac sources of chest pain.

## 2018-11-02 DIAGNOSIS — M25642 Stiffness of left hand, not elsewhere classified: Secondary | ICD-10-CM | POA: Diagnosis not present

## 2018-11-02 DIAGNOSIS — M6281 Muscle weakness (generalized): Secondary | ICD-10-CM | POA: Diagnosis not present

## 2018-11-02 DIAGNOSIS — M79645 Pain in left finger(s): Secondary | ICD-10-CM | POA: Diagnosis not present

## 2018-11-16 DIAGNOSIS — M25642 Stiffness of left hand, not elsewhere classified: Secondary | ICD-10-CM | POA: Diagnosis not present

## 2018-11-16 DIAGNOSIS — M6281 Muscle weakness (generalized): Secondary | ICD-10-CM | POA: Diagnosis not present

## 2018-11-16 DIAGNOSIS — M79645 Pain in left finger(s): Secondary | ICD-10-CM | POA: Diagnosis not present

## 2018-12-07 DIAGNOSIS — H353132 Nonexudative age-related macular degeneration, bilateral, intermediate dry stage: Secondary | ICD-10-CM | POA: Diagnosis not present

## 2018-12-07 DIAGNOSIS — H2513 Age-related nuclear cataract, bilateral: Secondary | ICD-10-CM | POA: Diagnosis not present

## 2019-01-08 ENCOUNTER — Encounter: Payer: Self-pay | Admitting: Family Medicine

## 2019-01-08 DIAGNOSIS — Z1231 Encounter for screening mammogram for malignant neoplasm of breast: Secondary | ICD-10-CM

## 2019-01-08 DIAGNOSIS — M810 Age-related osteoporosis without current pathological fracture: Secondary | ICD-10-CM

## 2019-02-08 ENCOUNTER — Other Ambulatory Visit: Payer: Self-pay | Admitting: Family Medicine

## 2019-02-19 ENCOUNTER — Other Ambulatory Visit: Payer: Self-pay | Admitting: Family Medicine

## 2019-02-26 DIAGNOSIS — M8588 Other specified disorders of bone density and structure, other site: Secondary | ICD-10-CM | POA: Diagnosis not present

## 2019-02-26 DIAGNOSIS — M810 Age-related osteoporosis without current pathological fracture: Secondary | ICD-10-CM | POA: Diagnosis not present

## 2019-02-26 DIAGNOSIS — M818 Other osteoporosis without current pathological fracture: Secondary | ICD-10-CM | POA: Diagnosis not present

## 2019-02-26 DIAGNOSIS — Z1231 Encounter for screening mammogram for malignant neoplasm of breast: Secondary | ICD-10-CM | POA: Diagnosis not present

## 2019-03-06 ENCOUNTER — Encounter: Payer: Self-pay | Admitting: Family Medicine

## 2019-03-06 ENCOUNTER — Other Ambulatory Visit: Payer: Self-pay

## 2019-03-06 ENCOUNTER — Ambulatory Visit (INDEPENDENT_AMBULATORY_CARE_PROVIDER_SITE_OTHER): Payer: PPO | Admitting: Family Medicine

## 2019-03-06 VITALS — BP 136/78 | HR 85 | Temp 97.8°F | Ht 66.0 in | Wt 168.0 lb

## 2019-03-06 DIAGNOSIS — R5383 Other fatigue: Secondary | ICD-10-CM | POA: Diagnosis not present

## 2019-03-06 DIAGNOSIS — R300 Dysuria: Secondary | ICD-10-CM | POA: Diagnosis not present

## 2019-03-06 DIAGNOSIS — N3000 Acute cystitis without hematuria: Secondary | ICD-10-CM | POA: Diagnosis not present

## 2019-03-06 DIAGNOSIS — M810 Age-related osteoporosis without current pathological fracture: Secondary | ICD-10-CM

## 2019-03-06 MED ORDER — L-METHYLFOLATE-B6-B12 1.13-25-2 MG PO TABS: 1.0000 | ORAL_TABLET | Freq: Every day | ORAL | 2 refills | Status: DC

## 2019-03-06 MED ORDER — ALENDRONATE SODIUM 70 MG PO TABS: 70.0000 mg | ORAL_TABLET | ORAL | 3 refills | Status: DC

## 2019-03-06 MED ORDER — SULFAMETHOXAZOLE-TRIMETHOPRIM 800-160 MG PO TABS: 1.0000 | ORAL_TABLET | Freq: Two times a day (BID) | ORAL | 0 refills | Status: DC

## 2019-03-07 ENCOUNTER — Other Ambulatory Visit: Payer: Self-pay

## 2019-03-07 LAB — COMP. METABOLIC PANEL (12)
AST: 16 IU/L (ref 0–40)
Albumin/Globulin Ratio: 1.8 (ref 1.2–2.2)
Albumin: 4.2 g/dL (ref 3.8–4.8)
Alkaline Phosphatase: 81 IU/L (ref 39–117)
BUN/Creatinine Ratio: 19 (ref 12–28)
BUN: 11 mg/dL (ref 8–27)
Bilirubin Total: 0.4 mg/dL (ref 0.0–1.2)
Calcium: 9.3 mg/dL (ref 8.7–10.3)
Chloride: 102 mmol/L (ref 96–106)
Creatinine, Ser: 0.59 mg/dL (ref 0.57–1.00)
GFR calc Af Amer: 107 mL/min/{1.73_m2} (ref >59)
GFR calc non Af Amer: 93 mL/min/{1.73_m2} (ref >59)
Globulin, Total: 2.3 g/dL (ref 1.5–4.5)
Glucose: 105 mg/dL — ABNORMAL HIGH (ref 65–99)
Potassium: 4.6 mmol/L (ref 3.5–5.2)
Sodium: 138 mmol/L (ref 134–144)
Total Protein: 6.5 g/dL (ref 6.0–8.5)

## 2019-03-07 LAB — CBC WITH DIFFERENTIAL/PLATELET
Basophils Absolute: 0 10*3/uL (ref 0.0–0.2)
Basos: 0 %
EOS (ABSOLUTE): 0.1 10*3/uL (ref 0.0–0.4)
Eos: 1 %
Hematocrit: 40.2 % (ref 34.0–46.6)
Hemoglobin: 13.8 g/dL (ref 11.1–15.9)
Immature Grans (Abs): 0 10*3/uL (ref 0.0–0.1)
Immature Granulocytes: 0 %
Lymphocytes Absolute: 1.9 10*3/uL (ref 0.7–3.1)
Lymphs: 19 %
MCH: 29.6 pg (ref 26.6–33.0)
MCHC: 34.3 g/dL (ref 31.5–35.7)
MCV: 86 fL (ref 79–97)
Monocytes Absolute: 0.6 10*3/uL (ref 0.1–0.9)
Monocytes: 6 %
Neutrophils Absolute: 7.5 10*3/uL — ABNORMAL HIGH (ref 1.4–7.0)
Neutrophils: 74 %
Platelets: 304 10*3/uL (ref 150–450)
RBC: 4.66 x10E6/uL (ref 3.77–5.28)
RDW: 13.3 % (ref 11.7–15.4)
WBC: 10.1 10*3/uL (ref 3.4–10.8)

## 2019-03-07 LAB — URINE CULTURE

## 2019-03-07 LAB — TSH: TSH: 2.51 u[IU]/mL (ref 0.450–4.500)

## 2019-03-07 MED ORDER — PHENAZOPYRIDINE HCL 200 MG PO TABS: 200.0000 mg | ORAL_TABLET | Freq: Three times a day (TID) | ORAL | 0 refills | Status: DC | PRN

## 2019-03-18 ENCOUNTER — Encounter: Payer: Self-pay | Admitting: Family Medicine

## 2019-03-18 DIAGNOSIS — M810 Age-related osteoporosis without current pathological fracture: Secondary | ICD-10-CM | POA: Insufficient documentation

## 2019-03-18 LAB — POCT URINALYSIS DIP (CLINITEK)
Blood, UA: NEGATIVE
Nitrite, UA: NEGATIVE

## 2019-03-18 NOTE — Progress Notes (Signed)
Acute Office Visit  Subjective:    Patient ID: Virginia Montoya, female    DOB: [birth year], 69 y.o.   MRN: [id number]  Chief Complaint  Patient presents with  . Urinary Tract Infection    x 5 days. Abdominal pain when she urinates, urinary frequency, dysuria. Has been taking Azo    HPI Patient is in today for bladder symptoms.  Patient is complaining of suprapubic pain for 2 days.  Mild dysuria.  No flank pain.  No nausea or vomiting.  No vaginal discharge or itching.  She does have increased frequency of urination. Patient also recently had a bone density test which showed that she has osteoporosis.  Past Medical History:  Diagnosis Date  . Chronic insomnia   . Depression   . Gastroesophageal reflux disease   . Hyperlipidemia   . Osteoporosis   . Prediabetes     Past Surgical History:  Procedure Laterality Date  . CHOLECYSTECTOMY  1999  . CORONARY CALCIUM SCORE/CORONARY CTA  01/2017   Coronary calcium score is 0.  Normal coronary origin with normal right dominance.  No evidence of CAD.  Most likely noncardiac chest pain.  Marland Kitchen SHOULDER SURGERY  2005  . TONSILLECTOMY    . TUBAL LIGATION  1977    Family History  Problem Relation Age of Onset  . Diabetes Mother   . Hyperlipidemia Mother   . Emphysema Father   . Diabetes Maternal Grandmother   . Liver cancer Maternal Grandmother     Social History   Socioeconomic History  . Marital status: Married    Spouse name: Not on file  . Number of children: 3  . Years of education: 55  . Highest education level: Associate degree: academic program  Occupational History  . Not on file  Tobacco Use  . Smoking status: Never Smoker  . Smokeless tobacco: Never Used  Substance and Sexual Activity  . Alcohol use: No  . Drug use: No  . Sexual activity: Not on file  Other Topics Concern  . Not on file  Social History Narrative   She is a married (remarried) mother of 3 with 4 grandchildren.  She has 3 great-grandchildren.  She  previously worked at [employer] in the [employer].  She completed high school and beauty school.   She works outside a lot in the yard on gardening.  She does a lot of walking and exercises with walking for at least a half an hour a day 5 days a week.   wears sunscreen, brushes and flosses daily, see's dentist bi-annually, has smoke/carbon monoxide detectors, wears a seatbelt and practices gun safety   Social Determinants of Health   Financial Resource Strain:   . Difficulty of Paying Living Expenses:   Food Insecurity:   . Worried About Programme researcher, broadcasting/film/video in the Last Year:   . Barista in the Last Year:   Transportation Needs:   . Freight forwarder (Medical):   Marland Kitchen Lack of Transportation (Non-Medical):   Physical Activity:   . Days of Exercise per Week:   . Minutes of Exercise per Session:   Stress:   . Feeling of Stress :   Social Connections:   . Frequency of Communication with Friends and Family:   . Frequency of Social Gatherings with Friends and Family:   . Attends Religious Services:   . Active Member of Clubs or Organizations:   . Attends Banker Meetings:   Marland Kitchen Marital Status:  Intimate Partner Violence:   . Fear of Current or Ex-Partner:   . Emotionally Abused:   Marland Kitchen Physically Abused:   . Sexually Abused:     Outpatient Medications Prior to Visit  Medication Sig Dispense Refill  . acetaminophen (TYLENOL) 500 MG tablet Take 500 mg every 6 (six) hours as needed by mouth.    Marland Kitchen aspirin 325 MG EC tablet Take 325 mg as needed by mouth for pain.    . citalopram (CELEXA) 20 MG tablet Take 10 mg by mouth daily.   2  . ibuprofen (ADVIL,MOTRIN) 200 MG tablet Take 200 mg every 6 (six) hours as needed by mouth.    Marland Kitchen LORazepam (ATIVAN) 0.5 MG tablet TAKE 1 TABLET BY MOUTH TWICE DAILY AS NEEDED FOR SEVERE ANXIETY 60 tablet 1  . Multiple Vitamins-Minerals (PRESERVISION AREDS PO) Take by mouth.    Marland Kitchen omeprazole (PRILOSEC) 20 MG capsule Take 20 mg by mouth.      . pyridOXINE (VITAMIN B-6) 100 MG tablet Take 100 mg by mouth daily.    . vitamin B-12 (CYANOCOBALAMIN) 1000 MCG tablet Take 1,000 mcg by mouth daily.    Marland Kitchen zolpidem (AMBIEN) 10 MG tablet TAKE 1 TABLET BY MOUTH AT BEDTIME 30 tablet 5  . rosuvastatin (CRESTOR) 20 MG tablet Take 20 mg by mouth at bedtime.    Marland Kitchen atorvastatin (LIPITOR) 10 MG tablet Take 10 mg daily at 6 PM by mouth.      No facility-administered medications prior to visit.    Allergies  Allergen Reactions  . Cephalexin Nausea And Vomiting    ask  . Biaxin [Clarithromycin] Other (See Comments)    CHEST PAIN    Review of Systems  Constitutional: Positive for fatigue. Negative for chills and fever.  HENT: Negative for congestion, ear pain and sore throat.   Respiratory: Negative for cough and shortness of breath.   Cardiovascular: Negative for chest pain.  Gastrointestinal: Positive for constipation. Negative for abdominal pain, diarrhea, nausea and vomiting.       Senokot helped.  Endocrine: Negative for polydipsia, polyphagia and polyuria.  Genitourinary: Negative for dysuria and urgency.  Musculoskeletal: Negative for arthralgias and myalgias.       Achy. Difficult to say if muscles or joints.  Neurological: Positive for dizziness. Negative for headaches.       Last week she had vertigo.  She fell last week she did not lose consciousness.  Her vertigo has now resolved.  Psychiatric/Behavioral: Negative for dysphoric mood. The patient is not nervous/anxious.        Objective:    Physical Exam Vitals reviewed.  Constitutional:      Appearance: Normal appearance. She is normal weight.  Cardiovascular:     Rate and Rhythm: Normal rate and regular rhythm.     Pulses: Normal pulses.     Heart sounds: Normal heart sounds.  Pulmonary:     Effort: Pulmonary effort is normal. No respiratory distress.     Breath sounds: Normal breath sounds.  Abdominal:     General: Abdomen is flat. Bowel sounds are normal.      Palpations: Abdomen is soft.     Tenderness: There is abdominal tenderness.     Comments: Mild suprapubic.  Neurological:     Mental Status: She is alert and oriented to person, place, and time.  Psychiatric:        Mood and Affect: Mood normal.        Behavior: Behavior normal.     BP 136/78 (  BP Location: Left Arm, Patient Position: Sitting)   Pulse 85   Temp 97.8 F (36.6 C) (Temporal)   Ht 5\' 6"  (1.676 m)   Wt 168 lb (76.2 kg)   SpO2 98%   BMI 27.12 kg/m  Wt Readings from Last 3 Encounters:  03/06/19 168 lb (76.2 kg)  02/10/17 151 lb 3.2 oz (68.6 kg)  11/17/16 158 lb (71.7 kg)    Health Maintenance Due  Topic Date Due  . Hepatitis C Screening  Never done  . COLONOSCOPY  Never done  . DEXA SCAN  Never done  . PNA vac Low Risk Adult (2 of 2 - PCV13) 10/12/2017    There are no preventive care reminders to display for this patient.   Lab Results  Component Value Date   TSH 2.510 03/06/2019   Lab Results  Component Value Date   WBC 10.1 03/06/2019   HGB 13.8 03/06/2019   HCT 40.2 03/06/2019   MCV 86 03/06/2019   PLT 304 03/06/2019   Lab Results  Component Value Date   NA 138 03/06/2019   K 4.6 03/06/2019   CO2 23 01/12/2017   GLUCOSE 105 (H) 03/06/2019   BUN 11 03/06/2019   CREATININE 0.59 03/06/2019   BILITOT 0.4 03/06/2019   ALKPHOS 81 03/06/2019   AST 16 03/06/2019   PROT 6.5 03/06/2019   ALBUMIN 4.2 03/06/2019   CALCIUM 9.3 03/06/2019   No results found for: CHOL No results found for: HDL No results found for: LDLCALC No results found for: TRIG No results found for: CHOLHDL No results found for: 05/06/2019     Assessment & Plan:  Age-related osteoporosis without current pathological fracture Recommend calcium with vitamin D. Recommend start alendronate 70 mg once weekly. Recommend weightbearing exercises.  Other fatigue Check labs today.  May all be related to UTI.  Acute cystitis Prescription for Bactrim given.  Urine sent for  culture.  Orders Placed This Encounter  Procedures  . Urine Culture  . CBC with Differential/Platelet  . Comp. Metabolic Panel (12)  . TSH     Meds ordered this encounter  Medications  . L-Methylfolate-B6-B12 1.13-25-2 MG TABS    Sig: Take 1 tablet by mouth daily.    Dispense:  30 tablet    Refill:  2  . sulfamethoxazole-trimethoprim (BACTRIM DS) 800-160 MG tablet    Sig: Take 1 tablet by mouth 2 (two) times daily.    Dispense:  14 tablet    Refill:  0  . alendronate (FOSAMAX) 70 MG tablet    Sig: Take 1 tablet (70 mg total) by mouth every 7 (seven) days. Take with a full glass of water on an empty stomach.    Dispense:  4 tablet    Refill:  3     11[birth year]22[birth year]1976, MD

## 2019-03-18 NOTE — Assessment & Plan Note (Signed)
Prescription for Bactrim given.  Urine sent for culture.

## 2019-03-18 NOTE — Assessment & Plan Note (Signed)
Recommend calcium with vitamin D. Recommend start alendronate 70 mg once weekly. Recommend weightbearing exercises.

## 2019-03-18 NOTE — Assessment & Plan Note (Signed)
Check labs today.  May all be related to UTI.

## 2019-03-18 NOTE — Patient Instructions (Signed)
Age-related osteoporosis without current pathological fracture Recommend calcium with vitamin D. Recommend start alendronate 70 mg once weekly. Recommend weightbearing exercises.  Other fatigue Check labs today.  May all be related to UTI.  Acute cystitis Prescription for Bactrim given.  Urine sent for culture.

## 2019-03-23 ENCOUNTER — Other Ambulatory Visit: Payer: Self-pay | Admitting: Family Medicine

## 2019-03-26 ENCOUNTER — Telehealth: Payer: PPO | Admitting: Physician Assistant

## 2019-03-27 ENCOUNTER — Other Ambulatory Visit: Payer: Self-pay

## 2019-03-27 ENCOUNTER — Ambulatory Visit (INDEPENDENT_AMBULATORY_CARE_PROVIDER_SITE_OTHER): Payer: PPO | Admitting: Nurse Practitioner

## 2019-03-27 ENCOUNTER — Ambulatory Visit: Payer: PPO

## 2019-03-27 VITALS — BP 156/82 | HR 89 | Temp 97.7°F | Ht 66.0 in | Wt 167.0 lb

## 2019-03-27 DIAGNOSIS — M545 Low back pain, unspecified: Secondary | ICD-10-CM | POA: Insufficient documentation

## 2019-03-27 MED ORDER — NAPROXEN 250 MG PO TABS
250.0000 mg | ORAL_TABLET | Freq: Two times a day (BID) | ORAL | 0 refills | Status: DC
Start: 2019-03-27 — End: 2019-05-06

## 2019-03-27 NOTE — Patient Instructions (Addendum)
Acute bilateral low back pain without sciatica Apply Ice or heat to lower back, anti- inflammatory, Muscle relaxant, rest and physical therapy is recommended. Rx sent to pharmacy. Patient knows to follow up as needed for worsening symptoms.  Patient advised to hold Ibuprofen when taking naproxen   Acute Back Pain, Adult Acute back pain is sudden and usually short-lived. It is often caused by an injury to the muscles and tissues in the back. The injury may result from:  A muscle or ligament getting overstretched or torn (strained). Ligaments are tissues that connect bones to each other. Lifting something improperly can cause a back strain.  Wear and tear (degeneration) of the spinal disks. Spinal disks are circular tissue that provides cushioning between the bones of the spine (vertebrae).  Twisting motions, such as while playing sports or doing yard work.  A hit to the back.  Arthritis. You may have a physical exam, lab tests, and imaging tests to find the cause of your pain. Acute back pain usually goes away with rest and home care. Follow these instructions at home: Managing pain, stiffness, and swelling  Take over-the-counter and prescription medicines only as told by your health care provider.  Your health care provider may recommend applying ice during the first 24-48 hours after your pain starts. To do this: ? Put ice in a plastic bag. ? Place a towel between your skin and the bag. ? Leave the ice on for 20 minutes, 2-3 times a day.  If directed, apply heat to the affected area as often as told by your health care provider. Use the heat source that your health care provider recommends, such as a moist heat pack or a heating pad. ? Place a towel between your skin and the heat source. ? Leave the heat on for 20-30 minutes. ? Remove the heat if your skin turns bright red. This is especially important if you are unable to feel pain, heat, or cold. You have a greater risk of getting  burned. Activity   Do not stay in bed. Staying in bed for more than 1-2 days can delay your recovery.  Sit up and stand up straight. Avoid leaning forward when you sit, or hunching over when you stand. ? If you work at a desk, sit close to it so you do not need to lean over. Keep your chin tucked in. Keep your neck drawn back, and keep your elbows bent at a right angle. Your arms should look like the letter "L." ? Sit high and close to the steering wheel when you drive. Add lower back (lumbar) support to your car seat, if needed.  Take short walks on even surfaces as soon as you are able. Try to increase the length of time you walk each day.  Do not sit, drive, or stand in one place for more than 30 minutes at a time. Sitting or standing for long periods of time can put stress on your back.  Do not drive or use heavy machinery while taking prescription pain medicine.  Use proper lifting techniques. When you bend and lift, use positions that put less stress on your back: ? [place] your knees. ? Keep the load close to your body. ? Avoid twisting.  Exercise regularly as told by your health care provider. Exercising helps your back heal faster and helps prevent back injuries by keeping muscles strong and flexible.  Work with a physical therapist to make a safe exercise program, as recommended by your health  care provider. Do any exercises as told by your physical therapist. Lifestyle  Maintain a healthy weight. Extra weight puts stress on your back and makes it difficult to have good posture.  Avoid activities or situations that make you feel anxious or stressed. Stress and anxiety increase muscle tension and can make back pain worse. Learn ways to manage anxiety and stress, such as through exercise. General instructions  Sleep on a firm mattress in a comfortable position. Try lying on your side with your knees slightly bent. If you lie on your back, put a pillow under your knees.  Follow  your treatment plan as told by your health care provider. This may include: ? Cognitive or behavioral therapy. ? Acupuncture or massage therapy. ? Meditation or yoga. Contact a health care provider if:  You have pain that is not relieved with rest or medicine.  You have increasing pain going down into your legs or buttocks.  Your pain does not improve after 2 weeks.  You have pain at night.  You lose weight without trying.  You have a fever or chills. Get help right away if:  You develop new bowel or bladder control problems.  You have unusual weakness or numbness in your arms or legs.  You develop nausea or vomiting.  You develop abdominal pain.  You feel faint. Summary  Acute back pain is sudden and usually short-lived.  Use proper lifting techniques. When you bend and lift, use positions that put less stress on your back.  Take over-the-counter and prescription medicines and apply heat or ice as directed by your health care provider. This information is not intended to replace advice given to you by your health care provider. Make sure you discuss any questions you have with your health care provider. Document Revised: 04/11/2018 Document Reviewed: 08/04/2016 Elsevier Patient Education  [employer].

## 2019-03-27 NOTE — Progress Notes (Signed)
Acute Office Visit  Subjective:    Patient ID: Virginia Montoya, female    DOB: October 27, 1950, 69 y.o.   MRN: [id number]  Patient  is a  69 year old female. She is here for Back pain.  The patient's medications were reviewed and reconciled since the patient's last visit.  History details were provided by the patient. The history appears to be reliable.    Chief Complaint  Patient presents with  . Back Pain    PULLED MUSCLE    Back Pain This is a recurrent problem. The current episode started in the past 7 days. The problem occurs constantly. The problem is unchanged. The pain is present in the lumbar spine. The quality of the pain is described as aching. The pain does not radiate. The pain is at a severity of 10/10. The pain is severe. The pain is the same all the time. The symptoms are aggravated by position, bending and sitting. Stiffness is present all day. Pertinent negatives include no abdominal pain. She has tried NSAIDs for the symptoms. The treatment provided no relief.   Patient is in today for   Past Medical History:  Diagnosis Date  . Chronic insomnia   . Depression   . Gastroesophageal reflux disease   . Hyperlipidemia   . Osteoporosis   . Prediabetes     Past Surgical History:  Procedure Laterality Date  . CHOLECYSTECTOMY  1999  . CORONARY CALCIUM SCORE/CORONARY CTA  01/2017   Coronary calcium score is 0.  Normal coronary origin with normal right dominance.  No evidence of CAD.  Most likely noncardiac chest pain.  Marland Kitchen SHOULDER SURGERY  2005  . TONSILLECTOMY    . TUBAL LIGATION  1977    Family History  Problem Relation Age of Onset  . Diabetes Mother   . Hyperlipidemia Mother   . Emphysema Father   . Diabetes Maternal Grandmother   . Liver cancer Maternal Grandmother     Social History   Socioeconomic History  . Marital status: Married    Spouse name: Not on file  . Number of children: 3  . Years of education: 66  . Highest education level: Associate  degree: academic program  Occupational History  . Not on file  Tobacco Use  . Smoking status: Never Smoker  . Smokeless tobacco: Never Used  Substance and Sexual Activity  . Alcohol use: No  . Drug use: No  . Sexual activity: Not on file  Other Topics Concern  . Not on file  Social History Narrative   She is a married (remarried) mother of 3 with 4 grandchildren.  She has 3 great-grandchildren.  She previously worked at [employer] in the [employer].  She completed high school and beauty school.   She works outside a lot in the yard on gardening.  She does a lot of walking and exercises with walking for at least a half an hour a day 5 days a week.   wears sunscreen, brushes and flosses daily, see's dentist bi-annually, has smoke/carbon monoxide detectors, wears a seatbelt and practices gun safety   Social Determinants of Health   Financial Resource Strain:   . Difficulty of Paying Living Expenses:   Food Insecurity:   . Worried About Programme researcher, broadcasting/film/video in the Last Year:   . Barista in the Last Year:   Transportation Needs:   . Freight forwarder (Medical):   Marland Kitchen Lack of Transportation (Non-Medical):   Physical Activity:   .  Days of Exercise per Week:   . Minutes of Exercise per Session:   Stress:   . Feeling of Stress :   Social Connections:   . Frequency of Communication with Friends and Family:   . Frequency of Social Gatherings with Friends and Family:   . Attends Religious Services:   . Active Member of Clubs or Organizations:   . Attends Banker Meetings:   Marland Kitchen Marital Status:   Intimate Partner Violence:   . Fear of Current or Ex-Partner:   . Emotionally Abused:   Marland Kitchen Physically Abused:   . Sexually Abused:     Outpatient Medications Prior to Visit  Medication Sig Dispense Refill  . acetaminophen (TYLENOL) 500 MG tablet Take 500 mg every 6 (six) hours as needed by mouth.    Marland Kitchen alendronate (FOSAMAX) 70 MG tablet Take 1 tablet (70 mg total) by  mouth every 7 (seven) days. Take with a full glass of water on an empty stomach. 4 tablet 3  . aspirin 325 MG EC tablet Take 325 mg as needed by mouth for pain.    . citalopram (CELEXA) 10 MG tablet TAKE 1 TABLET(10 MG) BY MOUTH EVERY DAY 90 tablet 1  . citalopram (CELEXA) 20 MG tablet Take 10 mg by mouth daily.   2  . ibuprofen (ADVIL,MOTRIN) 200 MG tablet Take 200 mg every 6 (six) hours as needed by mouth.    Marland Kitchen L-Methylfolate-B6-B12 1.13-25-2 MG TABS Take 1 tablet by mouth daily. 30 tablet 2  . LORazepam (ATIVAN) 0.5 MG tablet TAKE 1 TABLET BY MOUTH TWICE DAILY AS NEEDED FOR SEVERE ANXIETY 60 tablet 1  . Multiple Vitamins-Minerals (PRESERVISION AREDS PO) Take by mouth.    Marland Kitchen omeprazole (PRILOSEC) 20 MG capsule Take 20 mg by mouth.    . pyridOXINE (VITAMIN B-6) 100 MG tablet Take 100 mg by mouth daily.    . rosuvastatin (CRESTOR) 20 MG tablet Take 20 mg by mouth at bedtime.    . vitamin B-12 (CYANOCOBALAMIN) 1000 MCG tablet Take 1,000 mcg by mouth daily.    Marland Kitchen zolpidem (AMBIEN) 10 MG tablet TAKE 1 TABLET BY MOUTH AT BEDTIME 30 tablet 5  . phenazopyridine (PYRIDIUM) 200 MG tablet Take 1 tablet (200 mg total) by mouth 3 (three) times daily as needed. Take 1 tablet by mouth three times PRN for burning 9 tablet 0  . sulfamethoxazole-trimethoprim (BACTRIM DS) 800-160 MG tablet Take 1 tablet by mouth 2 (two) times daily. 14 tablet 0   No facility-administered medications prior to visit.    Allergies  Allergen Reactions  . Cephalexin Nausea And Vomiting    ask  . Biaxin [Clarithromycin] Other (See Comments)    CHEST PAIN    Review of Systems  Constitutional: Positive for activity change. Negative for appetite change.  HENT: Negative for ear pain.   Eyes: Negative for pain.  Respiratory: Negative for chest tightness and shortness of breath.   Gastrointestinal: Negative for abdominal pain.  Genitourinary: Negative for difficulty urinating.  Musculoskeletal: Positive for back pain.  Skin:  Negative for color change and rash.  Neurological: Negative for facial asymmetry.  Psychiatric/Behavioral: The patient is not nervous/anxious.        Objective:    Physical Exam Constitutional:      Appearance: Normal appearance.  HENT:     Head: Normocephalic.     Right Ear: External ear normal.     Left Ear: External ear normal.     Nose: Nose normal.  Mouth/Throat:     Mouth: Mucous membranes are moist.  Eyes:     Conjunctiva/sclera: Conjunctivae normal.  Cardiovascular:     Rate and Rhythm: Normal rate and regular rhythm.     Pulses: Normal pulses.     Heart sounds: Normal heart sounds.  Pulmonary:     Effort: Pulmonary effort is normal.     Breath sounds: Normal breath sounds.  Abdominal:     General: Bowel sounds are normal.  Musculoskeletal:        General: Tenderness present.       Arms:     Cervical back: No tenderness.     Comments: Increase pain, reduced range of motion. Reduced quality of life. Patient unable to complete bend over or bend side to side  Skin:    General: Skin is warm.     Findings: No rash.  Neurological:     Mental Status: She is alert.  Psychiatric:        Behavior: Behavior normal.     BP (!) 156/82 (BP Location: Left Arm, Patient Position: Sitting)   Pulse 89   Temp 97.7 F (36.5 C) (Temporal)   Ht 5\' 6"  (1.676 m)   Wt 167 lb (75.8 kg)   SpO2 98%   BMI 26.95 kg/m  Wt Readings from Last 3 Encounters:  03/27/19 167 lb (75.8 kg)  03/06/19 168 lb (76.2 kg)  02/10/17 151 lb 3.2 oz (68.6 kg)    Health Maintenance Due  Topic Date Due  . Hepatitis C Screening  Never done  . COLONOSCOPY  Never done  . PNA vac Low Risk Adult (2 of 2 - PCV13) 10/12/2017    There are no preventive care reminders to display for this patient.   Lab Results  Component Value Date   TSH 2.510 03/06/2019   Lab Results  Component Value Date   WBC 10.1 03/06/2019   HGB 13.8 03/06/2019   HCT 40.2 03/06/2019   MCV 86 03/06/2019   PLT 304  03/06/2019   Lab Results  Component Value Date   NA 138 03/06/2019   K 4.6 03/06/2019   CO2 23 01/12/2017   GLUCOSE 105 (H) 03/06/2019   BUN 11 03/06/2019   CREATININE 0.59 03/06/2019   BILITOT 0.4 03/06/2019   ALKPHOS 81 03/06/2019   AST 16 03/06/2019   PROT 6.5 03/06/2019   ALBUMIN 4.2 03/06/2019   CALCIUM 9.3 03/06/2019   No results found for: CHOL No results found for: HDL No results found for: LDLCALC No results found for: TRIG No results found for: CHOLHDL No results found for: 05/06/2019     Assessment & Plan:  Acute bilateral low back pain without sciatica Apply Ice or heat to lower back, anti- inflammatory, Muscle relaxant, rest and physical therapy is recommended. Rx sent to pharmacy. Patient knows to follow up as needed for worsening symptoms and knows not to take Ibuprofen with new medication.  Problem List Items Addressed This Visit      Other   Acute bilateral low back pain without sciatica - Primary    Apply Ice or heat to lower back, anti- inflammatory, Muscle relaxant, rest and physical therapy is recommended. Rx sent to pharmacy. Patient knows to follow up as needed for worsening symptoms and knows not to take Ibuprofen with new medication.      Relevant Medications   naproxen (NAPROSYN) 250 MG tablet       Meds ordered this encounter  Medications  . naproxen (NAPROSYN) 250  MG tablet    Sig: Take 1 tablet (250 mg total) by mouth 2 (two) times daily with a meal.    Dispense:  10 tablet    Refill:  0    Order Specific Question:   Supervising Provider    AnswerShelton Silvas     Ivy Lynn, NP

## 2019-03-27 NOTE — Assessment & Plan Note (Addendum)
Apply Ice or heat to lower back, anti- inflammatory, Muscle relaxant, rest and physical therapy is recommended. Rx sent to pharmacy. Patient knows to follow up as needed for worsening symptoms and knows not to take Ibuprofen with new medication.

## 2019-03-28 ENCOUNTER — Encounter: Payer: Self-pay | Admitting: Nurse Practitioner

## 2019-04-02 ENCOUNTER — Telehealth: Payer: Self-pay

## 2019-04-02 ENCOUNTER — Other Ambulatory Visit: Payer: Self-pay | Admitting: Nurse Practitioner

## 2019-04-02 ENCOUNTER — Encounter: Payer: Self-pay | Admitting: General Practice

## 2019-04-02 DIAGNOSIS — M545 Low back pain, unspecified: Secondary | ICD-10-CM

## 2019-04-02 MED ORDER — METHOCARBAMOL 500 MG PO TABS: 500.0000 mg | ORAL_TABLET | Freq: Four times a day (QID) | ORAL | 0 refills | Status: DC | PRN

## 2019-04-02 NOTE — Telephone Encounter (Signed)
Patient informed to pick-up prescription.  She is going to get her x-ray at [facility] today.

## 2019-04-02 NOTE — Progress Notes (Signed)
Increase back pain and spasm, lower back X-ray ordered, Muscle relaxant recommended. Rx sent to pharmacy. Patient knows to call with worsening symptoms.

## 2019-04-02 NOTE — Telephone Encounter (Signed)
Mrs. Petras called with complaints of ongoing pain in her low back.  The pain seems to be localized to the low back area with no numbness or tingling of legs.  She has completed the anti-inflammatory but she is experiencing back spasms.  She is questioning whether see needs xrays at this time and a muscle relaxant.

## 2019-04-02 NOTE — Telephone Encounter (Signed)
I will send Muscle relaxant to Pharmacy now and refer patient for lower back X-ray in the hospital  Thank you

## 2019-04-03 ENCOUNTER — Other Ambulatory Visit: Payer: Self-pay | Admitting: Nurse Practitioner

## 2019-04-03 DIAGNOSIS — M545 Low back pain, unspecified: Secondary | ICD-10-CM

## 2019-04-03 NOTE — Progress Notes (Unsigned)
Unresolved back pain, x-ray shows mild scoliosis of the spine T12.pain management and Physical therapy may help with alleviating pain an discomfort. X-ray results attached to Patients folder.

## 2019-04-11 DIAGNOSIS — M6281 Muscle weakness (generalized): Secondary | ICD-10-CM | POA: Diagnosis not present

## 2019-04-11 DIAGNOSIS — M2560 Stiffness of unspecified joint, not elsewhere classified: Secondary | ICD-10-CM | POA: Diagnosis not present

## 2019-04-11 DIAGNOSIS — M545 Low back pain: Secondary | ICD-10-CM | POA: Diagnosis not present

## 2019-04-14 ENCOUNTER — Other Ambulatory Visit: Payer: Self-pay | Admitting: Family Medicine

## 2019-04-16 DIAGNOSIS — M2560 Stiffness of unspecified joint, not elsewhere classified: Secondary | ICD-10-CM | POA: Diagnosis not present

## 2019-04-16 DIAGNOSIS — M545 Low back pain: Secondary | ICD-10-CM | POA: Diagnosis not present

## 2019-04-16 DIAGNOSIS — M6281 Muscle weakness (generalized): Secondary | ICD-10-CM | POA: Diagnosis not present

## 2019-04-17 ENCOUNTER — Telehealth: Payer: Self-pay | Admitting: Family Medicine

## 2019-04-17 NOTE — Progress Notes (Signed)
  Chronic Care Management   Outreach Note  04/17/2019 Name: Virginia Montoya MRN: [id number] DOB: 11/17/1950  Referred by: Blane Ohara, MD Reason for referral : No chief complaint on file.   An unsuccessful telephone outreach was attempted today. The patient was referred to the pharmacist for assistance with care management and care coordination.   Follow Up Plan:   Lynnae January Upstream Scheduler

## 2019-04-20 ENCOUNTER — Telehealth (INDEPENDENT_AMBULATORY_CARE_PROVIDER_SITE_OTHER): Payer: PPO | Admitting: Family Medicine

## 2019-04-20 ENCOUNTER — Encounter: Payer: Self-pay | Admitting: Family Medicine

## 2019-04-20 VITALS — BP 135/82 | Temp 97.7°F | Wt 160.0 lb

## 2019-04-20 DIAGNOSIS — M5442 Lumbago with sciatica, left side: Secondary | ICD-10-CM

## 2019-04-20 DIAGNOSIS — Z87440 Personal history of urinary (tract) infections: Secondary | ICD-10-CM | POA: Diagnosis not present

## 2019-04-20 DIAGNOSIS — N3000 Acute cystitis without hematuria: Secondary | ICD-10-CM | POA: Diagnosis not present

## 2019-04-20 MED ORDER — PHENAZOPYRIDINE HCL 200 MG PO TABS: 200.0000 mg | ORAL_TABLET | Freq: Three times a day (TID) | ORAL | 0 refills | Status: DC | PRN

## 2019-04-20 MED ORDER — CIPROFLOXACIN HCL 250 MG PO TABS: 250.0000 mg | ORAL_TABLET | Freq: Two times a day (BID) | ORAL | 0 refills | Status: DC

## 2019-04-20 NOTE — Progress Notes (Signed)
Subjective:  Patient ID: Virginia Montoya, female    DOB: October 22, 1950  Age: 69 y.o. MRN: [id number]  Chief Complaint  Patient presents with  . Urinary Tract Infection    Shooting, burning with urination. Sxs present since Monday. Patient states that the antibiotic she was rx'd for her last UTI that she did not complete the course rx'd she only took 814 tablets.    HPI Patient is a 69 yo female who had a uti in the beginning of March and was given Bactrim Ds. This helped, but her bladder symptoms have worsened. She took a partial course of antibiotics. She is having shooting, burning with urination. Symptoms present since Monday. She was seen for back spasm mid March here and is seeing [facility].     Social History   Socioeconomic History  . Marital status: Married    Spouse name: Not on file  . Number of children: 3  . Years of education: 56  . Highest education level: Associate degree: academic program  Occupational History  . Not on file  Tobacco Use  . Smoking status: Never Smoker  . Smokeless tobacco: Never Used  Substance and Sexual Activity  . Alcohol use: No  . Drug use: No  . Sexual activity: Not on file  Other Topics Concern  . Not on file  Social History Narrative   She is a married (remarried) mother of 3 with 4 grandchildren.  She has 3 great-grandchildren.  She previously worked at [employer] in [place].  She completed high school and beauty school.   She works outside a lot in the yard on gardening.  She does a lot of walking and exercises with walking for at least a half an hour a day 5 days a week.   wears sunscreen, brushes and flosses daily, see's dentist bi-annually, has smoke/carbon monoxide detectors, wears a seatbelt and practices gun safety   Social Determinants of Health   Financial Resource Strain:   . Difficulty of Paying Living Expenses:   Food Insecurity:   . Worried About Charity fundraiser in the Last Year:   . Academic librarian in the Last Year:   Transportation Needs:   . Film/video editor (Medical):   Marland Kitchen Lack of Transportation (Non-Medical):   Physical Activity:   . Days of Exercise per Week:   . Minutes of Exercise per Session:   Stress:   . Feeling of Stress :   Social Connections:   . Frequency of Communication with Friends and Family:   . Frequency of Social Gatherings with Friends and Family:   . Attends Religious Services:   . Active Member of Clubs or Organizations:   . Attends Archivist Meetings:   Marland Kitchen Marital Status:    Past Medical History:  Diagnosis Date  . Chronic insomnia   . Depression   . Gastroesophageal reflux disease   . Hyperlipidemia   . Osteoporosis   . Prediabetes    Family History  Problem Relation Age of Onset  . Diabetes Mother   . Hyperlipidemia Mother   . Emphysema Father   . Diabetes Maternal Grandmother   . Liver cancer Maternal Grandmother     Review of Systems  Constitutional: Negative for chills, fatigue and fever.  HENT: Negative for congestion, ear pain, rhinorrhea and sore throat.   Respiratory: Negative for cough and shortness of breath.   Cardiovascular: Negative for chest pain.  Gastrointestinal: Negative for abdominal  pain, constipation, diarrhea, nausea and vomiting.  Genitourinary: Positive for frequency and urgency. Negative for dysuria.  Musculoskeletal: Positive for back pain. Negative for myalgias.  Neurological: Negative for dizziness and headaches.  Psychiatric/Behavioral: Negative for dysphoric mood. The patient is not nervous/anxious.      Objective:  BP 135/82   Temp 97.7 F (36.5 C)   Wt 160 lb (72.6 kg)   BMI 25.82 kg/m   BP/Weight 05/02/2019 04/20/2019 03/27/2019  Systolic BP 136 135 156  Diastolic BP 82 82 82  Wt. (Lbs) 163 160 167  BMI 26.31 25.82 26.95    Physical Exam Vitals reviewed.  Constitutional:      Appearance: Normal appearance. She is normal weight.  Neck:     Vascular: No carotid bruit.    Cardiovascular:     Rate and Rhythm: Normal rate and regular rhythm.     Pulses: Normal pulses.     Heart sounds: Normal heart sounds.  Pulmonary:     Effort: Pulmonary effort is normal. No respiratory distress.     Breath sounds: Normal breath sounds.  Abdominal:     General: Abdomen is flat. Bowel sounds are normal.     Palpations: Abdomen is soft.     Tenderness: There is abdominal tenderness (suprapubic).  Musculoskeletal:        General: Tenderness (lumbar) present.  Neurological:     Mental Status: She is alert and oriented to person, place, and time.  Psychiatric:        Mood and Affect: Mood normal.        Behavior: Behavior normal.     Lab Results  Component Value Date   WBC 6.1 05/02/2019   HGB 14.1 05/02/2019   HCT 43.3 05/02/2019   PLT 320 05/02/2019   GLUCOSE 104 (H) 05/02/2019   CHOL 136 05/02/2019   TRIG 190 (H) 05/02/2019   HDL 43 05/02/2019   LDLCALC 62 05/02/2019   ALT 17 05/02/2019   AST 16 05/02/2019   NA 139 05/02/2019   K 4.4 05/02/2019   CL 102 05/02/2019   CREATININE 0.63 05/02/2019   BUN 8 05/02/2019   CO2 22 05/02/2019   TSH 2.510 03/06/2019   HGBA1C 6.0 (H) 05/02/2019      Assessment & Plan:  1. Acute cystitis without hematuria Given cipro and pyridium.  2. Acute midline low back pain with left-sided sciatica Continue current medicines and continue physical therapy.   Follow-up: Return if symptoms worsen or fail to improve.  An After Visit Summary was printed and given to the patient.  Blane Ohara [facility] [phone]

## 2019-04-22 DIAGNOSIS — Z20828 Contact with and (suspected) exposure to other viral communicable diseases: Secondary | ICD-10-CM | POA: Diagnosis not present

## 2019-04-24 DIAGNOSIS — M2560 Stiffness of unspecified joint, not elsewhere classified: Secondary | ICD-10-CM | POA: Diagnosis not present

## 2019-04-24 DIAGNOSIS — M6281 Muscle weakness (generalized): Secondary | ICD-10-CM | POA: Diagnosis not present

## 2019-04-24 DIAGNOSIS — M545 Low back pain: Secondary | ICD-10-CM | POA: Diagnosis not present

## 2019-04-27 DIAGNOSIS — M2560 Stiffness of unspecified joint, not elsewhere classified: Secondary | ICD-10-CM | POA: Diagnosis not present

## 2019-04-27 DIAGNOSIS — M545 Low back pain: Secondary | ICD-10-CM | POA: Diagnosis not present

## 2019-04-27 DIAGNOSIS — M6281 Muscle weakness (generalized): Secondary | ICD-10-CM | POA: Diagnosis not present

## 2019-04-30 ENCOUNTER — Telehealth: Payer: Self-pay | Admitting: Family Medicine

## 2019-04-30 NOTE — Progress Notes (Signed)
Subjective:  Patient ID: Virginia Montoya, female    DOB: 1950/11/06  Age: 69 y.o. MRN: [id number]  Chief Complaint  Patient presents with  . Gastroesophageal Reflux  . Hyperlipidemia  . Depression  . Prediabetes    HPI Concerning her gastro-esophageal reflux disease without esophagitis, no associated symptoms are reported.  Symptoms are improved with a proton-pump inhibitor ( Prilosec ).      Pt presents with hyperlipidemia.  Date of diagnosis 03/03/2012.  Current treatment includes Lipitor and low fat diet (most of the time.)  Compliance with treatment has been good; she maintains her low cholesterol diet, follows up as directed, and maintains her exercise regimen.      Virginia Montoya presents with a diagnosis of prediabetes.  The course has been stable and nonprogressive.  Eating healthy.      Virginia Montoya presents with major depressive disorder, recurrent, moderate.  This is a routine follow-up.  Date of diagnosis 2013.  Current medications include celexa 20 mg 1/2 daily. and lorazepam 0.5 mg once at bedtime. She is frequently upset and crying. She is always worrying about her behavior adversely affects others. She has panic attacks.         Dx with primary insomnia; this has been noted for the past several years.  Current medications include zolpidem.    Patient is complaining of lumbar back pain. Xray showed scoliosis and arthritis. Given methocarbamol which helped, but she stopped because she felt it gave her a bladder infection. Bladder symptoms resolved.   Takes ibuprofen 200 g 3 twice a day. No tylenol.  She is improving. Hurts when she sits 1-2/10.  Walking hurts her instantly. Leaning on cart helps when grocery shopping. Physical therapy is doing dry needling and exercising. Helping. This is also contributing to her depression as she is in pain all the time.   Past Medical History:  Diagnosis Date  . Chronic insomnia   . Depression   . Gastroesophageal reflux disease   . Hyperlipidemia   .  Osteoporosis   . Prediabetes    Past Surgical History:  Procedure Laterality Date  . CHOLECYSTECTOMY  1999  . CORONARY CALCIUM SCORE/CORONARY CTA  01/2017   Coronary calcium score is 0.  Normal coronary origin with normal right dominance.  No evidence of CAD.  Most likely noncardiac chest pain.  Marland Kitchen SHOULDER SURGERY  2005  . TONSILLECTOMY    . TUBAL LIGATION  1977    Family History  Problem Relation Age of Onset  . Diabetes Mother   . Hyperlipidemia Mother   . Emphysema Father   . Diabetes Maternal Grandmother   . Liver cancer Maternal Grandmother    Social History   Socioeconomic History  . Marital status: Married    Spouse name: Not on file  . Number of children: 3  . Years of education: 51  . Highest education level: Associate degree: academic program  Occupational History  . Not on file  Tobacco Use  . Smoking status: Never Smoker  . Smokeless tobacco: Never Used  Substance and Sexual Activity  . Alcohol use: No  . Drug use: No  . Sexual activity: Not on file  Other Topics Concern  . Not on file  Social History Narrative   She is a married (remarried) mother of 3 with 4 grandchildren.  She has 3 great-grandchildren.  She previously worked at [employer] in the [employer].  She completed high school and beauty school.   She works outside a lot in  the yard on gardening.  She does a lot of walking and exercises with walking for at least a half an hour a day 5 days a week.   wears sunscreen, brushes and flosses daily, see's dentist bi-annually, has smoke/carbon monoxide detectors, wears a seatbelt and practices gun safety   Social Determinants of Health   Financial Resource Strain:   . Difficulty of Paying Living Expenses:   Food Insecurity:   . Worried About Charity fundraiser in the Last Year:   . Arboriculturist in the Last Year:   Transportation Needs:   . Film/video editor (Medical):   Marland Kitchen Lack of Transportation (Non-Medical):   Physical Activity:   .  Days of Exercise per Week:   . Minutes of Exercise per Session:   Stress:   . Feeling of Stress :   Social Connections:   . Frequency of Communication with Friends and Family:   . Frequency of Social Gatherings with Friends and Family:   . Attends Religious Services:   . Active Member of Clubs or Organizations:   . Attends Archivist Meetings:   Marland Kitchen Marital Status:     Review of Systems  Constitutional: Negative for chills, fatigue and fever.  HENT: Negative for congestion, ear pain, rhinorrhea and sore throat.   Respiratory: Negative for cough and shortness of breath.   Cardiovascular: Negative for chest pain.  Gastrointestinal: Negative for abdominal pain, constipation, diarrhea, nausea and vomiting.  Genitourinary: Negative for dysuria and urgency.  Musculoskeletal: Positive for arthralgias and back pain. Negative for myalgias.  Neurological: Negative for dizziness, weakness, light-headedness and headaches.  Psychiatric/Behavioral: Negative for dysphoric mood (Currenty being treated). The patient is not nervous/anxious.      Objective:  BP 136/82   Pulse 72   Temp 97.6 F (36.4 C)   Ht 5\' 6"  (1.676 m)   Wt 163 lb (73.9 kg)   SpO2 98%   BMI 26.31 kg/m   BP/Weight 05/02/2019 04/20/2019 9/32/6712  Systolic BP 458 099 833  Diastolic BP 82 82 82  Wt. (Lbs) 163 160 167  BMI 26.31 25.82 26.95    Physical Exam Constitutional:      Appearance: Normal appearance.  Cardiovascular:     Rate and Rhythm: Normal rate and regular rhythm.  Pulmonary:     Effort: Pulmonary effort is normal.     Breath sounds: Normal breath sounds.  Abdominal:     General: Abdomen is flat. Bowel sounds are normal.     Palpations: Abdomen is soft.     Tenderness: There is no abdominal tenderness.  Musculoskeletal:        General: Tenderness (lumbar spine and paraspinal muscles. BL SLR. ) present.  Skin:    General: Skin is warm.  Neurological:     Mental Status: She is alert.   Psychiatric:        Mood and Affect: Mood normal.        Behavior: Behavior normal.   Her gait is stooped and slowed.  Lab Results  Component Value Date   WBC 6.1 05/02/2019   HGB 14.1 05/02/2019   HCT 43.3 05/02/2019   PLT 320 05/02/2019   GLUCOSE 104 (H) 05/02/2019   CHOL 136 05/02/2019   TRIG 190 (H) 05/02/2019   HDL 43 05/02/2019   LDLCALC 62 05/02/2019   ALT 17 05/02/2019   AST 16 05/02/2019   NA 139 05/02/2019   K 4.4 05/02/2019   CL 102 05/02/2019  CREATININE 0.63 05/02/2019   BUN 8 05/02/2019   CO2 22 05/02/2019   TSH 2.510 03/06/2019   HGBA1C 6.0 (H) 05/02/2019      Assessment & Plan:  1. Mixed hyperlipidemia Well controlled.  No changes to medicines.  Continue to work on eating a healthy diet and exercise.  Labs drawn today.  - Lipid panel - Comprehensive metabolic panel  2. Prediabetes Recommend continue to work on eating healthy diet and exercise. - CBC with Differential/Platelet - Hemoglobin A1c  3. Spinal stenosis of lumbosacral region Start on ibuprofen 600 mg once three times a day and baclofen 10 mg one three times a day as needed for muscle spasms. Continue physical therapy. - MR Lumbar Spine Wo Contrast; Future  4. Moderate recurrent major depression (HCC) Recommend counseling. The current medical regimen is effective;  continue present plan and medications.  Follow-up: Return in about 4 weeks (around 05/30/2019).  Time spent in visit: 40 minutes  An After Visit Summary was printed and given to the patient.  Blane Ohara [facility] [phone]

## 2019-04-30 NOTE — Progress Notes (Signed)
  Chronic Care Management   Outreach Note  04/30/2019 Name: Virginia Montoya MRN: [id number] DOB: 07/31/1950  Referred by: Blane Ohara, MD Reason for referral : No chief complaint on file.   An unsuccessful telephone outreach was attempted today. The patient was referred to the pharmacist for assistance with care management and care coordination.   This note is not being shared with the patient for the following reason: To respect privacy (The patient or proxy has requested that the information not be shared).  Follow Up Plan:   Lynnae January [facility] Scheduler

## 2019-05-01 DIAGNOSIS — M545 Low back pain: Secondary | ICD-10-CM | POA: Diagnosis not present

## 2019-05-01 DIAGNOSIS — M6281 Muscle weakness (generalized): Secondary | ICD-10-CM | POA: Diagnosis not present

## 2019-05-01 DIAGNOSIS — M2560 Stiffness of unspecified joint, not elsewhere classified: Secondary | ICD-10-CM | POA: Diagnosis not present

## 2019-05-02 ENCOUNTER — Encounter: Payer: Self-pay | Admitting: Family Medicine

## 2019-05-02 ENCOUNTER — Ambulatory Visit (INDEPENDENT_AMBULATORY_CARE_PROVIDER_SITE_OTHER): Payer: PPO | Admitting: Family Medicine

## 2019-05-02 ENCOUNTER — Other Ambulatory Visit: Payer: Self-pay

## 2019-05-02 VITALS — BP 136/82 | HR 72 | Temp 97.6°F | Ht 66.0 in | Wt 163.0 lb

## 2019-05-02 DIAGNOSIS — F331 Major depressive disorder, recurrent, moderate: Secondary | ICD-10-CM

## 2019-05-02 DIAGNOSIS — M4807 Spinal stenosis, lumbosacral region: Secondary | ICD-10-CM

## 2019-05-02 DIAGNOSIS — R7303 Prediabetes: Secondary | ICD-10-CM

## 2019-05-02 DIAGNOSIS — E782 Mixed hyperlipidemia: Secondary | ICD-10-CM

## 2019-05-02 MED ORDER — IBUPROFEN 600 MG PO TABS: 600.0000 mg | ORAL_TABLET | Freq: Three times a day (TID) | ORAL | 0 refills | Status: DC | PRN

## 2019-05-02 MED ORDER — BACLOFEN 10 MG PO TABS: 10.0000 mg | ORAL_TABLET | Freq: Three times a day (TID) | ORAL | 0 refills | Status: DC

## 2019-05-02 NOTE — Patient Instructions (Addendum)
Lumbar pain - ordering a lumbar mri  Baclofen 10 mg one three times a day.   Ibuprofen 600 mg one three times a day.   May also take tylenol 500 mg 1-2 pills three times a day as needed.   Generalized anxiety - increase lorazepam to 0.5 mg one twice a day. Call to get counseling appointment.  Dr. Aquilla Solian - [phone]. Virginia Montoya (same phone number.  Spinal Stenosis  Spinal stenosis happens when the open space (spinal canal) between the bones of your spine (vertebrae) gets smaller. It is caused by bone pushing into the open spaces of your backbone (spine). This puts pressure on your backbone and the nerves in your backbone. Treatment often focuses on managing any pain and symptoms. In some cases, surgery may be needed. Follow these instructions at home: Managing pain, stiffness, and swelling   Do all exercises and stretches as told by your doctor.  Stand and sit up straight (use good posture). If you were given a brace or a corset, wear it as told by your doctor.  Do not do any activities that cause pain. Ask your doctor what activities are safe for you.  Do not lift anything that is heavier than 10 lb (4.5 kg) or heavier than your doctor tells you.  Try to stay at a healthy weight. Talk with your doctor if you need help losing weight.  If directed, put heat on the affected area as often as told by your doctor. Use the heat source that your doctor recommends, such as a moist heat pack or a heating pad. ? Put a towel between your skin and the heat source. ? Leave the heat on for 20-30 minutes. ? Remove the heat if your skin turns bright red. This is especially important if you are not able to feel pain, heat, or cold. You may have a greater risk of getting burned. General instructions  Take over-the-counter and prescription medicines only as told by your doctor.  Do not use any products that contain nicotine or tobacco, such as cigarettes and e-cigarettes. If you need help  quitting, ask your doctor.  Eat a healthy diet. This includes plenty of fruits and vegetables, whole grains, and low-fat (lean) protein.  Keep all follow-up visits as told by your doctor. This is important. Contact a doctor if:  Your symptoms do not get better.  Your symptoms get worse.  You have a fever. Get help right away if:  You have new or worse pain in your neck or upper back.  You have very bad pain that medicine does not control.  You are dizzy.  You have vision problems, blurred vision, or double vision.  You have a very bad headache that is worse when you stand.  You feel sick to your stomach (nauseous).  You throw up (vomit).  You have new or worse numbness or tingling in your back or legs.  You have pain, redness, swelling, or warmth in your arm or leg. Summary  Spinal stenosis happens when the open space (spinal canal) between the bones of your spine gets smaller (narrow).  Contact a doctor if your symptoms get worse.  In some cases, surgery may be needed. This information is not intended to replace advice given to you by your health care provider. Make sure you discuss any questions you have with your health care provider. Document Revised: 12/03/2016 Document Reviewed: 11/26/2015 Elsevier Patient Education  2020 [employer].

## 2019-05-03 DIAGNOSIS — M2560 Stiffness of unspecified joint, not elsewhere classified: Secondary | ICD-10-CM | POA: Diagnosis not present

## 2019-05-03 DIAGNOSIS — M545 Low back pain: Secondary | ICD-10-CM | POA: Diagnosis not present

## 2019-05-03 DIAGNOSIS — M6281 Muscle weakness (generalized): Secondary | ICD-10-CM | POA: Diagnosis not present

## 2019-05-03 LAB — COMPREHENSIVE METABOLIC PANEL
ALT: 17 IU/L (ref 0–32)
AST: 16 IU/L (ref 0–40)
Albumin/Globulin Ratio: 2.1 (ref 1.2–2.2)
Albumin: 4.4 g/dL (ref 3.8–4.8)
Alkaline Phosphatase: 113 IU/L (ref 39–117)
BUN/Creatinine Ratio: 13 (ref 12–28)
BUN: 8 mg/dL (ref 8–27)
Bilirubin Total: 0.5 mg/dL (ref 0.0–1.2)
CO2: 22 mmol/L (ref 20–29)
Calcium: 9.5 mg/dL (ref 8.7–10.3)
Chloride: 102 mmol/L (ref 96–106)
Creatinine, Ser: 0.63 mg/dL (ref 0.57–1.00)
GFR calc Af Amer: 105 mL/min/{1.73_m2} (ref >59)
GFR calc non Af Amer: 91 mL/min/{1.73_m2} (ref >59)
Globulin, Total: 2.1 g/dL (ref 1.5–4.5)
Glucose: 104 mg/dL — ABNORMAL HIGH (ref 65–99)
Potassium: 4.4 mmol/L (ref 3.5–5.2)
Sodium: 139 mmol/L (ref 134–144)
Total Protein: 6.5 g/dL (ref 6.0–8.5)

## 2019-05-03 LAB — CBC WITH DIFFERENTIAL/PLATELET
Basophils Absolute: 0 10*3/uL (ref 0.0–0.2)
Basos: 1 %
EOS (ABSOLUTE): 0.1 10*3/uL (ref 0.0–0.4)
Eos: 1 %
Hematocrit: 43.3 % (ref 34.0–46.6)
Hemoglobin: 14.1 g/dL (ref 11.1–15.9)
Immature Grans (Abs): 0 10*3/uL (ref 0.0–0.1)
Immature Granulocytes: 0 %
Lymphocytes Absolute: 2 10*3/uL (ref 0.7–3.1)
Lymphs: 33 %
MCH: 28.8 pg (ref 26.6–33.0)
MCHC: 32.6 g/dL (ref 31.5–35.7)
MCV: 88 fL (ref 79–97)
Monocytes Absolute: 0.4 10*3/uL (ref 0.1–0.9)
Monocytes: 7 %
Neutrophils Absolute: 3.6 10*3/uL (ref 1.4–7.0)
Neutrophils: 58 %
Platelets: 320 10*3/uL (ref 150–450)
RBC: 4.9 x10E6/uL (ref 3.77–5.28)
RDW: 13.2 % (ref 11.7–15.4)
WBC: 6.1 10*3/uL (ref 3.4–10.8)

## 2019-05-03 LAB — HEMOGLOBIN A1C
Est. average glucose Bld gHb Est-mCnc: 126 mg/dL
Hgb A1c MFr Bld: 6 % — ABNORMAL HIGH (ref 4.8–5.6)

## 2019-05-03 LAB — LIPID PANEL
Chol/HDL Ratio: 3.2 ratio (ref 0.0–4.4)
Cholesterol, Total: 136 mg/dL (ref 100–199)
HDL: 43 mg/dL (ref >39)
LDL Chol Calc (NIH): 62 mg/dL (ref 0–99)
Triglycerides: 190 mg/dL — ABNORMAL HIGH (ref 0–149)
VLDL Cholesterol Cal: 31 mg/dL (ref 5–40)

## 2019-05-03 LAB — CARDIOVASCULAR RISK ASSESSMENT

## 2019-05-06 ENCOUNTER — Encounter: Payer: Self-pay | Admitting: Family Medicine

## 2019-05-06 DIAGNOSIS — M5442 Lumbago with sciatica, left side: Secondary | ICD-10-CM | POA: Insufficient documentation

## 2019-05-06 DIAGNOSIS — F330 Major depressive disorder, recurrent, mild: Secondary | ICD-10-CM | POA: Insufficient documentation

## 2019-05-06 DIAGNOSIS — F331 Major depressive disorder, recurrent, moderate: Secondary | ICD-10-CM | POA: Insufficient documentation

## 2019-05-06 DIAGNOSIS — R7303 Prediabetes: Secondary | ICD-10-CM | POA: Insufficient documentation

## 2019-05-06 DIAGNOSIS — M4807 Spinal stenosis, lumbosacral region: Secondary | ICD-10-CM | POA: Insufficient documentation

## 2019-05-08 DIAGNOSIS — M2560 Stiffness of unspecified joint, not elsewhere classified: Secondary | ICD-10-CM | POA: Diagnosis not present

## 2019-05-08 DIAGNOSIS — M6281 Muscle weakness (generalized): Secondary | ICD-10-CM | POA: Diagnosis not present

## 2019-05-08 DIAGNOSIS — M545 Low back pain: Secondary | ICD-10-CM | POA: Diagnosis not present

## 2019-05-09 DIAGNOSIS — H524 Presbyopia: Secondary | ICD-10-CM | POA: Diagnosis not present

## 2019-05-09 DIAGNOSIS — H353132 Nonexudative age-related macular degeneration, bilateral, intermediate dry stage: Secondary | ICD-10-CM | POA: Diagnosis not present

## 2019-05-09 DIAGNOSIS — H25013 Cortical age-related cataract, bilateral: Secondary | ICD-10-CM | POA: Diagnosis not present

## 2019-05-09 DIAGNOSIS — H2513 Age-related nuclear cataract, bilateral: Secondary | ICD-10-CM | POA: Diagnosis not present

## 2019-05-10 DIAGNOSIS — M545 Low back pain: Secondary | ICD-10-CM | POA: Diagnosis not present

## 2019-05-10 DIAGNOSIS — M2560 Stiffness of unspecified joint, not elsewhere classified: Secondary | ICD-10-CM | POA: Diagnosis not present

## 2019-05-10 DIAGNOSIS — M6281 Muscle weakness (generalized): Secondary | ICD-10-CM | POA: Diagnosis not present

## 2019-05-14 DIAGNOSIS — M47816 Spondylosis without myelopathy or radiculopathy, lumbar region: Secondary | ICD-10-CM | POA: Diagnosis not present

## 2019-05-14 DIAGNOSIS — M545 Low back pain: Secondary | ICD-10-CM | POA: Diagnosis not present

## 2019-05-14 DIAGNOSIS — M5137 Other intervertebral disc degeneration, lumbosacral region: Secondary | ICD-10-CM | POA: Diagnosis not present

## 2019-05-14 DIAGNOSIS — M4854XA Collapsed vertebra, not elsewhere classified, thoracic region, initial encounter for fracture: Secondary | ICD-10-CM | POA: Diagnosis not present

## 2019-05-14 DIAGNOSIS — M4807 Spinal stenosis, lumbosacral region: Secondary | ICD-10-CM | POA: Diagnosis not present

## 2019-05-15 ENCOUNTER — Other Ambulatory Visit: Payer: Self-pay

## 2019-05-15 DIAGNOSIS — M6281 Muscle weakness (generalized): Secondary | ICD-10-CM | POA: Diagnosis not present

## 2019-05-15 DIAGNOSIS — M545 Low back pain: Secondary | ICD-10-CM | POA: Diagnosis not present

## 2019-05-15 DIAGNOSIS — M2560 Stiffness of unspecified joint, not elsewhere classified: Secondary | ICD-10-CM | POA: Diagnosis not present

## 2019-05-15 DIAGNOSIS — M4807 Spinal stenosis, lumbosacral region: Secondary | ICD-10-CM

## 2019-05-17 DIAGNOSIS — M2560 Stiffness of unspecified joint, not elsewhere classified: Secondary | ICD-10-CM | POA: Diagnosis not present

## 2019-05-17 DIAGNOSIS — M6281 Muscle weakness (generalized): Secondary | ICD-10-CM | POA: Diagnosis not present

## 2019-05-17 DIAGNOSIS — M545 Low back pain: Secondary | ICD-10-CM | POA: Diagnosis not present

## 2019-05-22 DIAGNOSIS — M545 Low back pain: Secondary | ICD-10-CM | POA: Diagnosis not present

## 2019-05-22 DIAGNOSIS — M6281 Muscle weakness (generalized): Secondary | ICD-10-CM | POA: Diagnosis not present

## 2019-05-22 DIAGNOSIS — M2560 Stiffness of unspecified joint, not elsewhere classified: Secondary | ICD-10-CM | POA: Diagnosis not present

## 2019-05-24 DIAGNOSIS — M6281 Muscle weakness (generalized): Secondary | ICD-10-CM | POA: Diagnosis not present

## 2019-05-24 DIAGNOSIS — M2560 Stiffness of unspecified joint, not elsewhere classified: Secondary | ICD-10-CM | POA: Diagnosis not present

## 2019-05-24 DIAGNOSIS — M545 Low back pain: Secondary | ICD-10-CM | POA: Diagnosis not present

## 2019-05-28 DIAGNOSIS — M545 Low back pain: Secondary | ICD-10-CM | POA: Diagnosis not present

## 2019-05-28 DIAGNOSIS — M6281 Muscle weakness (generalized): Secondary | ICD-10-CM | POA: Diagnosis not present

## 2019-05-28 DIAGNOSIS — M2560 Stiffness of unspecified joint, not elsewhere classified: Secondary | ICD-10-CM | POA: Diagnosis not present

## 2019-05-29 ENCOUNTER — Other Ambulatory Visit: Payer: Self-pay | Admitting: Family Medicine

## 2019-05-29 DIAGNOSIS — M4807 Spinal stenosis, lumbosacral region: Secondary | ICD-10-CM

## 2019-05-29 NOTE — Progress Notes (Signed)
Subjective:  Patient ID: Virginia Montoya, female    DOB: 22-Jul-1950  Age: 69 y.o. MRN: [id number]  Chief Complaint  Patient presents with  . Anxiety    Increased medication back to original dose which has helped.   . Back Pain    Currently seeing PT which is helping    HPI Patient is complaining of lumbar back pain. Xray showed scoliosis and arthritis. Given methocarbamol which helped, but she stopped because she felt it gave her a bladder infection. Bladder symptoms resolved.   Takes ibuprofen 200 g 3 twice a day. No tylenol.  She is improving. Hurts when she sits 1-2/10.  Walking hurts her instantly. Leaning on cart helps when grocery shopping. Physical therapy is doing dry needling and exercising. Helping. This is also contributing to her depression as she is in pain all the time.   Anxiety has improved, but is still present. She has difficulty maintaining standing when she has a panic attack. She becomes lightheaded.  Patient is seeing a counselor at [facility] by the name of Judson Roch. This is helping.  Past Medical History:  Diagnosis Date  . Chronic insomnia   . Depression   . Gastroesophageal reflux disease   . Hyperlipidemia   . Osteoporosis   . Prediabetes    Past Surgical History:  Procedure Laterality Date  . CHOLECYSTECTOMY  1999  . CORONARY CALCIUM SCORE/CORONARY CTA  01/2017   Coronary calcium score is 0.  Normal coronary origin with normal right dominance.  No evidence of CAD.  Most likely noncardiac chest pain.  Marland Kitchen SHOULDER SURGERY  2005  . TONSILLECTOMY    . TUBAL LIGATION  1977    Family History  Problem Relation Age of Onset  . Diabetes Mother   . Hyperlipidemia Mother   . Emphysema Father   . Diabetes Maternal Grandmother   . Liver cancer Maternal Grandmother    Social History   Socioeconomic History  . Marital status: Married    Spouse name: Not on file  . Number of children: 3  . Years of education: 43  . Highest education level: Associate  degree: academic program  Occupational History  . Not on file  Tobacco Use  . Smoking status: Never Smoker  . Smokeless tobacco: Never Used  Substance and Sexual Activity  . Alcohol use: No  . Drug use: No  . Sexual activity: Not on file  Other Topics Concern  . Not on file  Social History Narrative   She is a married (remarried) mother of 3 with 4 grandchildren.  She has 3 great-grandchildren.  She previously worked at [employer] in [place].  She completed high school and beauty school.   She works outside a lot in the yard on gardening.  She does a lot of walking and exercises with walking for at least a half an hour a day 5 days a week.   wears sunscreen, brushes and flosses daily, see's dentist bi-annually, has smoke/carbon monoxide detectors, wears a seatbelt and practices gun safety   Social Determinants of Health   Financial Resource Strain:   . Difficulty of Paying Living Expenses:   Food Insecurity:   . Worried About Charity fundraiser in the Last Year:   . Arboriculturist in the Last Year:   Transportation Needs:   . Film/video editor (Medical):   Marland Kitchen Lack of Transportation (Non-Medical):   Physical Activity:   . Days of Exercise per Week:   .  Minutes of Exercise per Session:   Stress:   . Feeling of Stress :   Social Connections:   . Frequency of Communication with Friends and Family:   . Frequency of Social Gatherings with Friends and Family:   . Attends Religious Services:   . Active Member of Clubs or Organizations:   . Attends Banker Meetings:   Marland Kitchen Marital Status:     Review of Systems  Constitutional: Negative for chills, fatigue and fever.  HENT: Negative for congestion, ear pain, rhinorrhea and sore throat.   Respiratory: Negative for cough and shortness of breath.   Cardiovascular: Negative for chest pain.  Gastrointestinal: Negative for abdominal pain, constipation, diarrhea, nausea and vomiting.  Genitourinary: Negative for  dysuria and urgency.  Musculoskeletal: Positive for arthralgias and back pain. Negative for myalgias.  Neurological: Negative for dizziness, weakness, light-headedness and headaches.  Psychiatric/Behavioral: Negative for dysphoric mood (Currenty being treated). The patient is not nervous/anxious.      Objective:  BP 122/80   Pulse 87   Temp (!) 97.5 F (36.4 C)   Ht 5\' 6"  (1.676 m)   Wt 163 lb (73.9 kg)   SpO2 99%   BMI 26.31 kg/m   BP/Weight 05/30/2019 05/02/2019 04/20/2019  Systolic BP 122 136 135  Diastolic BP 80 82 82  Wt. (Lbs) 163 163 160  BMI 26.31 26.31 25.82    Physical Exam Constitutional:      Appearance: Normal appearance.  Cardiovascular:     Rate and Rhythm: Normal rate and regular rhythm.  Pulmonary:     Effort: Pulmonary effort is normal.     Breath sounds: Normal breath sounds.  Abdominal:     General: Abdomen is flat. Bowel sounds are normal.     Palpations: Abdomen is soft.     Tenderness: There is no abdominal tenderness.  Musculoskeletal:        General: Tenderness (lumbar spine and paraspinal muscles. BL SLR. ) present.  Skin:    General: Skin is warm.  Neurological:     Mental Status: She is alert.  Psychiatric:        Mood and Affect: Mood normal.        Behavior: Behavior normal.   Her gait is stooped and slowed.  Lab Results  Component Value Date   WBC 6.1 05/02/2019   HGB 14.1 05/02/2019   HCT 43.3 05/02/2019   PLT 320 05/02/2019   GLUCOSE 104 (H) 05/02/2019   CHOL 136 05/02/2019   TRIG 190 (H) 05/02/2019   HDL 43 05/02/2019   LDLCALC 62 05/02/2019   ALT 17 05/02/2019   AST 16 05/02/2019   NA 139 05/02/2019   K 4.4 05/02/2019   CL 102 05/02/2019   CREATININE 0.63 05/02/2019   BUN 8 05/02/2019   CO2 22 05/02/2019   TSH 2.510 03/06/2019   HGBA1C 6.0 (H) 05/02/2019      Assessment & Plan:   1. Spinal stenosis of lumbosacral region Start on ibuprofen 600 mg once three times a day and baclofen 10 mg one three times a day  as needed for muscle spasms. Continue physical therapy. - MR Lumbar Spine Wo Contrast; Future  2. GAD/PANIC ATTACKS - Continue current medications. Continue counselor.    Follow-up: Return in about 3 months (around 08/30/2019) for chronic (fasting).  Time spent in visit: 40 minutes  An After Visit Summary was printed and given to the patient.  09/01/2019 [facility] [phone]

## 2019-05-30 ENCOUNTER — Encounter: Payer: Self-pay | Admitting: Family Medicine

## 2019-05-30 ENCOUNTER — Other Ambulatory Visit: Payer: Self-pay

## 2019-05-30 ENCOUNTER — Ambulatory Visit (INDEPENDENT_AMBULATORY_CARE_PROVIDER_SITE_OTHER): Payer: PPO | Admitting: Family Medicine

## 2019-05-30 VITALS — BP 122/80 | HR 87 | Temp 97.5°F | Ht 66.0 in | Wt 163.0 lb

## 2019-05-30 DIAGNOSIS — M5442 Lumbago with sciatica, left side: Secondary | ICD-10-CM | POA: Diagnosis not present

## 2019-05-30 DIAGNOSIS — M48062 Spinal stenosis, lumbar region with neurogenic claudication: Secondary | ICD-10-CM

## 2019-05-30 DIAGNOSIS — F410 Panic disorder [episodic paroxysmal anxiety] without agoraphobia: Secondary | ICD-10-CM

## 2019-05-30 DIAGNOSIS — F411 Generalized anxiety disorder: Secondary | ICD-10-CM | POA: Diagnosis not present

## 2019-05-31 DIAGNOSIS — M6281 Muscle weakness (generalized): Secondary | ICD-10-CM | POA: Diagnosis not present

## 2019-05-31 DIAGNOSIS — M545 Low back pain: Secondary | ICD-10-CM | POA: Diagnosis not present

## 2019-05-31 DIAGNOSIS — M2560 Stiffness of unspecified joint, not elsewhere classified: Secondary | ICD-10-CM | POA: Diagnosis not present

## 2019-06-04 ENCOUNTER — Encounter: Payer: Self-pay | Admitting: Family Medicine

## 2019-06-04 DIAGNOSIS — M48062 Spinal stenosis, lumbar region with neurogenic claudication: Secondary | ICD-10-CM | POA: Insufficient documentation

## 2019-06-04 DIAGNOSIS — F410 Panic disorder [episodic paroxysmal anxiety] without agoraphobia: Secondary | ICD-10-CM | POA: Insufficient documentation

## 2019-06-04 DIAGNOSIS — F411 Generalized anxiety disorder: Secondary | ICD-10-CM | POA: Insufficient documentation

## 2019-06-05 DIAGNOSIS — M545 Low back pain: Secondary | ICD-10-CM | POA: Diagnosis not present

## 2019-06-05 DIAGNOSIS — M2560 Stiffness of unspecified joint, not elsewhere classified: Secondary | ICD-10-CM | POA: Diagnosis not present

## 2019-06-05 DIAGNOSIS — M6281 Muscle weakness (generalized): Secondary | ICD-10-CM | POA: Diagnosis not present

## 2019-06-07 DIAGNOSIS — M545 Low back pain: Secondary | ICD-10-CM | POA: Diagnosis not present

## 2019-06-07 DIAGNOSIS — M2560 Stiffness of unspecified joint, not elsewhere classified: Secondary | ICD-10-CM | POA: Diagnosis not present

## 2019-06-07 DIAGNOSIS — M6281 Muscle weakness (generalized): Secondary | ICD-10-CM | POA: Diagnosis not present

## 2019-06-13 DIAGNOSIS — M2560 Stiffness of unspecified joint, not elsewhere classified: Secondary | ICD-10-CM | POA: Diagnosis not present

## 2019-06-13 DIAGNOSIS — M545 Low back pain: Secondary | ICD-10-CM | POA: Diagnosis not present

## 2019-06-13 DIAGNOSIS — M6281 Muscle weakness (generalized): Secondary | ICD-10-CM | POA: Diagnosis not present

## 2019-06-14 ENCOUNTER — Encounter: Payer: Self-pay | Admitting: Family Medicine

## 2019-06-14 ENCOUNTER — Ambulatory Visit (INDEPENDENT_AMBULATORY_CARE_PROVIDER_SITE_OTHER): Payer: PPO | Admitting: Family Medicine

## 2019-06-14 ENCOUNTER — Other Ambulatory Visit: Payer: Self-pay

## 2019-06-14 VITALS — BP 150/70 | HR 68 | Temp 97.7°F | Resp 17 | Ht 66.0 in | Wt 163.0 lb

## 2019-06-14 DIAGNOSIS — F411 Generalized anxiety disorder: Secondary | ICD-10-CM | POA: Diagnosis not present

## 2019-06-14 DIAGNOSIS — R300 Dysuria: Secondary | ICD-10-CM | POA: Diagnosis not present

## 2019-06-14 DIAGNOSIS — F410 Panic disorder [episodic paroxysmal anxiety] without agoraphobia: Secondary | ICD-10-CM | POA: Diagnosis not present

## 2019-06-14 LAB — POCT URINALYSIS DIP (CLINITEK)
Bilirubin, UA: NEGATIVE
Blood, UA: NEGATIVE
Glucose, UA: NEGATIVE mg/dL
Ketones, POC UA: NEGATIVE mg/dL
Leukocytes, UA: NEGATIVE
Nitrite, UA: NEGATIVE
POC PROTEIN,UA: NEGATIVE
Spec Grav, UA: 1.015 (ref 1.010–1.025)
Urobilinogen, UA: 0.2 E.U./dL
pH, UA: 6 (ref 5.0–8.0)

## 2019-06-14 MED ORDER — ALPRAZOLAM 0.25 MG PO TABS: 0.2500 mg | ORAL_TABLET | Freq: Two times a day (BID) | ORAL | 2 refills | Status: DC | PRN

## 2019-06-14 NOTE — Progress Notes (Signed)
Acute Office Visit  Subjective:    Patient ID: Virginia Montoya, female    DOB: 11/08/1950, 69 y.o.   MRN: [id number]  Chief Complaint  Patient presents with  . Urinary Tract Infection    Patient stated last week she feels dysuria, urgency.  . Anxiety    Patient wants to change therapy from lorazepam to alprazolam    Patient is in today for dysuria, urgency and frequency urination since last week. She feels better today, but she wants to be checked just in case she has an infection.  She also takes lorazepam for anxiety and she noticed that it is not helping with her panic attacks and anxiety. She wants to try alprazolam because she took it before and it did help with her anxiety.  Past Medical History:  Diagnosis Date  . Chronic insomnia   . Depression   . Gastroesophageal reflux disease   . Hyperlipidemia   . Osteoporosis   . Prediabetes     Past Surgical History:  Procedure Laterality Date  . CHOLECYSTECTOMY  1999  . CORONARY CALCIUM SCORE/CORONARY CTA  01/2017   Coronary calcium score is 0.  Normal coronary origin with normal right dominance.  No evidence of CAD.  Most likely noncardiac chest pain.  Marland Kitchen SHOULDER SURGERY  2005  . TONSILLECTOMY    . TUBAL LIGATION  1977    Family History  Problem Relation Age of Onset  . Diabetes Mother   . Hyperlipidemia Mother   . Emphysema Father   . Diabetes Maternal Grandmother   . Liver cancer Maternal Grandmother     Social History   Socioeconomic History  . Marital status: Married    Spouse name: Not on file  . Number of children: 3  . Years of education: 53  . Highest education level: Associate degree: academic program  Occupational History  . Occupation: Retired  Tobacco Use  . Smoking status: Never Smoker  . Smokeless tobacco: Never Used  Substance and Sexual Activity  . Alcohol use: No  . Drug use: No  . Sexual activity: Not Currently  Other Topics Concern  . Not on file  Social History Narrative   She is  a married (remarried) mother of 3 with 4 grandchildren.  She has 3 great-grandchildren.  She previously worked at [employer] in the [employer].  She completed high school and beauty school.   She works outside a lot in the yard on gardening.  She does a lot of walking and exercises with walking for at least a half an hour a day 5 days a week.   wears sunscreen, brushes and flosses daily, see's dentist bi-annually, has smoke/carbon monoxide detectors, wears a seatbelt and practices gun safety   Social Determinants of Health   Financial Resource Strain:   . Difficulty of Paying Living Expenses:   Food Insecurity:   . Worried About Programme researcher, broadcasting/film/video in the Last Year:   . Barista in the Last Year:   Transportation Needs:   . Freight forwarder (Medical):   Marland Kitchen Lack of Transportation (Non-Medical):   Physical Activity:   . Days of Exercise per Week:   . Minutes of Exercise per Session:   Stress:   . Feeling of Stress :   Social Connections:   . Frequency of Communication with Friends and Family:   . Frequency of Social Gatherings with Friends and Family:   . Attends Religious Services:   . Active Member of  Clubs or Organizations:   . Attends Archivist Meetings:   Marland Kitchen Marital Status:   Intimate Partner Violence:   . Fear of Current or Ex-Partner:   . Emotionally Abused:   Marland Kitchen Physically Abused:   . Sexually Abused:     Outpatient Medications Prior to Visit  Medication Sig Dispense Refill  . acetaminophen (TYLENOL) 500 MG tablet Take 500 mg every 6 (six) hours as needed by mouth.    Marland Kitchen alendronate (FOSAMAX) 70 MG tablet Take 1 tablet (70 mg total) by mouth every 7 (seven) days. Take with a full glass of water on an empty stomach. 4 tablet 3  . aspirin 325 MG EC tablet Take 325 mg as needed by mouth for pain.    . baclofen (LIORESAL) 10 MG tablet TAKE 1 TABLET(10 MG) BY MOUTH THREE TIMES DAILY 90 tablet 1  . citalopram (CELEXA) 20 MG tablet Take 10 mg by mouth  daily.   2  . L-Methylfolate-B6-B12 1.13-25-2 MG TABS Take 1 tablet by mouth daily. 30 tablet 2  . LORazepam (ATIVAN) 0.5 MG tablet TAKE 1 TABLET BY MOUTH TWICE DAILY AS NEEDED FOR SEVERE ANXIETY 60 tablet 1  . Multiple Vitamins-Minerals (PRESERVISION AREDS PO) Take by mouth.    Marland Kitchen omeprazole (PRILOSEC) 20 MG capsule Take 20 mg by mouth.    . pyridOXINE (VITAMIN B-6) 100 MG tablet Take 100 mg by mouth daily.    . rosuvastatin (CRESTOR) 20 MG tablet TAKE 1 TABLET( 20 MG) BY MOUTH ONCE DAILY 90 tablet 1  . vitamin B-12 (CYANOCOBALAMIN) 1000 MCG tablet Take 1,000 mcg by mouth daily.    Marland Kitchen zolpidem (AMBIEN) 10 MG tablet TAKE 1 TABLET BY MOUTH AT BEDTIME 30 tablet 5  . ibuprofen (ADVIL) 600 MG tablet TAKE 1 TABLET(600 MG) BY MOUTH EVERY 8 HOURS AS NEEDED 90 tablet 0   No facility-administered medications prior to visit.    Allergies  Allergen Reactions  . Cephalexin Nausea And Vomiting    ask  . Biaxin [Clarithromycin] Other (See Comments)    CHEST PAIN  . Sulfa Antibiotics     Made her feel poorly.     Review of Systems  Constitutional: Negative for chills and fever.  Respiratory: Negative for cough and shortness of breath.   Gastrointestinal: Negative for abdominal pain and nausea.  Genitourinary: Positive for dysuria, frequency and urgency. Negative for flank pain and vaginal discharge.  Neurological: Negative for headaches.       Objective:    Physical Exam Constitutional:      Appearance: Normal appearance. She is normal weight.  Cardiovascular:     Rate and Rhythm: Normal rate and regular rhythm.  Pulmonary:     Effort: Pulmonary effort is normal.     Breath sounds: Normal breath sounds.  Abdominal:     General: Abdomen is flat. Bowel sounds are normal.     Palpations: Abdomen is soft. There is no mass.     Tenderness: There is no abdominal tenderness.  Psychiatric:        Mood and Affect: Mood normal.        Behavior: Behavior normal.     BP (!) 150/70 (BP  Location: Right Arm, Patient Position: Sitting)   Pulse 68   Temp 97.7 F (36.5 C) (Temporal)   Resp 17   Ht 5\' 6"  (1.676 m)   Wt 163 lb (73.9 kg)   BMI 26.31 kg/m  Wt Readings from Last 3 Encounters:  06/14/19 163 lb (73.9 kg)  05/30/19 163 lb (73.9 kg)  05/02/19 163 lb (73.9 kg)    Health Maintenance Due  Topic Date Due  . Hepatitis C Screening  Never done  . COLONOSCOPY  Never done  . PNA vac Low Risk Adult (2 of 2 - PCV13) 10/12/2017  . COVID-19 Vaccine (2 - Pfizer 2-dose series) 06/14/2019    There are no preventive care reminders to display for this patient.   Lab Results  Component Value Date   TSH 2.510 03/06/2019   Lab Results  Component Value Date   WBC 6.1 05/02/2019   HGB 14.1 05/02/2019   HCT 43.3 05/02/2019   MCV 88 05/02/2019   PLT 320 05/02/2019   Lab Results  Component Value Date   NA 139 05/02/2019   K 4.4 05/02/2019   CO2 22 05/02/2019   GLUCOSE 104 (H) 05/02/2019   BUN 8 05/02/2019   CREATININE 0.63 05/02/2019   BILITOT 0.5 05/02/2019   ALKPHOS 113 05/02/2019   AST 16 05/02/2019   ALT 17 05/02/2019   PROT 6.5 05/02/2019   ALBUMIN 4.4 05/02/2019   CALCIUM 9.5 05/02/2019   Lab Results  Component Value Date   CHOL 136 05/02/2019   Lab Results  Component Value Date   HDL 43 05/02/2019   Lab Results  Component Value Date   LDLCALC 62 05/02/2019   Lab Results  Component Value Date   TRIG 190 (H) 05/02/2019   Lab Results  Component Value Date   CHOLHDL 3.2 05/02/2019   Lab Results  Component Value Date   HGBA1C 6.0 (H) 05/02/2019       Assessment & Plan:  1. GAD (generalized anxiety disorder) Discontinue lorazepam and start low dose xanax 0.25mg  one twice a day as needed for severe anxiety.  2. Panic attacks Xanax rx given.  3. Dysuria - POCT URINALYSIS DIP (CLINITEK) Normal.    Meds ordered this encounter  Medications  . ALPRAZolam (XANAX) 0.25 MG tablet    Sig: Take 1 tablet (0.25 mg total) by mouth 2 (two)  times daily as needed for anxiety.    Dispense:  60 tablet    Refill:  2    Orders Placed This Encounter  Procedures  . POCT URINALYSIS DIP (CLINITEK)     Follow-up: No follow-ups on file.  An After Visit Summary was printed and given to the patient.  Eugenie Norrie [facility] [phone]

## 2019-06-17 ENCOUNTER — Encounter: Payer: Self-pay | Admitting: Family Medicine

## 2019-06-18 ENCOUNTER — Other Ambulatory Visit: Payer: Self-pay | Admitting: Family Medicine

## 2019-06-19 DIAGNOSIS — Z01818 Encounter for other preprocedural examination: Secondary | ICD-10-CM | POA: Diagnosis not present

## 2019-06-19 DIAGNOSIS — H25812 Combined forms of age-related cataract, left eye: Secondary | ICD-10-CM | POA: Diagnosis not present

## 2019-06-21 DIAGNOSIS — M2560 Stiffness of unspecified joint, not elsewhere classified: Secondary | ICD-10-CM | POA: Diagnosis not present

## 2019-06-21 DIAGNOSIS — M545 Low back pain: Secondary | ICD-10-CM | POA: Diagnosis not present

## 2019-06-21 DIAGNOSIS — M6281 Muscle weakness (generalized): Secondary | ICD-10-CM | POA: Diagnosis not present

## 2019-06-27 ENCOUNTER — Other Ambulatory Visit: Payer: Self-pay | Admitting: Family Medicine

## 2019-06-27 DIAGNOSIS — M4807 Spinal stenosis, lumbosacral region: Secondary | ICD-10-CM

## 2019-06-28 DIAGNOSIS — M2560 Stiffness of unspecified joint, not elsewhere classified: Secondary | ICD-10-CM | POA: Diagnosis not present

## 2019-06-28 DIAGNOSIS — M545 Low back pain: Secondary | ICD-10-CM | POA: Diagnosis not present

## 2019-06-28 DIAGNOSIS — M6281 Muscle weakness (generalized): Secondary | ICD-10-CM | POA: Diagnosis not present

## 2019-07-03 DIAGNOSIS — E669 Obesity, unspecified: Secondary | ICD-10-CM | POA: Diagnosis not present

## 2019-07-03 DIAGNOSIS — H2512 Age-related nuclear cataract, left eye: Secondary | ICD-10-CM | POA: Diagnosis not present

## 2019-07-03 DIAGNOSIS — H353190 Nonexudative age-related macular degeneration, unspecified eye, stage unspecified: Secondary | ICD-10-CM | POA: Diagnosis not present

## 2019-07-03 DIAGNOSIS — E785 Hyperlipidemia, unspecified: Secondary | ICD-10-CM | POA: Diagnosis not present

## 2019-07-03 DIAGNOSIS — H52223 Regular astigmatism, bilateral: Secondary | ICD-10-CM | POA: Diagnosis not present

## 2019-07-03 DIAGNOSIS — H259 Unspecified age-related cataract: Secondary | ICD-10-CM | POA: Diagnosis not present

## 2019-07-03 DIAGNOSIS — M1990 Unspecified osteoarthritis, unspecified site: Secondary | ICD-10-CM | POA: Diagnosis not present

## 2019-07-03 DIAGNOSIS — H25812 Combined forms of age-related cataract, left eye: Secondary | ICD-10-CM | POA: Diagnosis not present

## 2019-07-03 DIAGNOSIS — Z79899 Other long term (current) drug therapy: Secondary | ICD-10-CM | POA: Diagnosis not present

## 2019-07-12 DIAGNOSIS — M6281 Muscle weakness (generalized): Secondary | ICD-10-CM | POA: Diagnosis not present

## 2019-07-12 DIAGNOSIS — M545 Low back pain: Secondary | ICD-10-CM | POA: Diagnosis not present

## 2019-07-12 DIAGNOSIS — M2560 Stiffness of unspecified joint, not elsewhere classified: Secondary | ICD-10-CM | POA: Diagnosis not present

## 2019-07-13 ENCOUNTER — Other Ambulatory Visit: Payer: Self-pay

## 2019-07-13 MED ORDER — LORAZEPAM 0.5 MG PO TABS: 0.5000 mg | ORAL_TABLET | Freq: Two times a day (BID) | ORAL | 0 refills | Status: DC

## 2019-07-17 DIAGNOSIS — H25811 Combined forms of age-related cataract, right eye: Secondary | ICD-10-CM | POA: Diagnosis not present

## 2019-07-17 DIAGNOSIS — H2512 Age-related nuclear cataract, left eye: Secondary | ICD-10-CM | POA: Diagnosis not present

## 2019-07-17 DIAGNOSIS — K219 Gastro-esophageal reflux disease without esophagitis: Secondary | ICD-10-CM | POA: Diagnosis not present

## 2019-07-17 DIAGNOSIS — H3530 Unspecified macular degeneration: Secondary | ICD-10-CM | POA: Diagnosis not present

## 2019-07-17 DIAGNOSIS — E669 Obesity, unspecified: Secondary | ICD-10-CM | POA: Diagnosis not present

## 2019-07-17 DIAGNOSIS — E785 Hyperlipidemia, unspecified: Secondary | ICD-10-CM | POA: Diagnosis not present

## 2019-07-17 DIAGNOSIS — M1990 Unspecified osteoarthritis, unspecified site: Secondary | ICD-10-CM | POA: Diagnosis not present

## 2019-07-17 DIAGNOSIS — Z79899 Other long term (current) drug therapy: Secondary | ICD-10-CM | POA: Diagnosis not present

## 2019-07-17 DIAGNOSIS — E7800 Pure hypercholesterolemia, unspecified: Secondary | ICD-10-CM | POA: Diagnosis not present

## 2019-07-17 DIAGNOSIS — F419 Anxiety disorder, unspecified: Secondary | ICD-10-CM | POA: Diagnosis not present

## 2019-07-26 DIAGNOSIS — M545 Low back pain: Secondary | ICD-10-CM | POA: Diagnosis not present

## 2019-07-26 DIAGNOSIS — M6281 Muscle weakness (generalized): Secondary | ICD-10-CM | POA: Diagnosis not present

## 2019-07-26 DIAGNOSIS — M2560 Stiffness of unspecified joint, not elsewhere classified: Secondary | ICD-10-CM | POA: Diagnosis not present

## 2019-07-31 ENCOUNTER — Other Ambulatory Visit: Payer: Self-pay | Admitting: Family Medicine

## 2019-07-31 DIAGNOSIS — M4807 Spinal stenosis, lumbosacral region: Secondary | ICD-10-CM

## 2019-08-09 ENCOUNTER — Other Ambulatory Visit: Payer: Self-pay | Admitting: Physician Assistant

## 2019-08-29 ENCOUNTER — Encounter: Payer: Self-pay | Admitting: Family Medicine

## 2019-08-29 ENCOUNTER — Other Ambulatory Visit: Payer: Self-pay

## 2019-08-29 ENCOUNTER — Ambulatory Visit (INDEPENDENT_AMBULATORY_CARE_PROVIDER_SITE_OTHER): Payer: PPO | Admitting: Family Medicine

## 2019-08-29 VITALS — BP 150/80 | HR 98 | Temp 98.0°F | Resp 17 | Ht 66.0 in | Wt 156.0 lb

## 2019-08-29 DIAGNOSIS — M5442 Lumbago with sciatica, left side: Secondary | ICD-10-CM | POA: Diagnosis not present

## 2019-08-29 DIAGNOSIS — F411 Generalized anxiety disorder: Secondary | ICD-10-CM | POA: Diagnosis not present

## 2019-08-29 DIAGNOSIS — F410 Panic disorder [episodic paroxysmal anxiety] without agoraphobia: Secondary | ICD-10-CM

## 2019-08-29 MED ORDER — FLUOXETINE HCL 20 MG PO TABS: 20.0000 mg | ORAL_TABLET | Freq: Every day | ORAL | 0 refills | Status: DC

## 2019-08-29 NOTE — Progress Notes (Signed)
Acute Office Visit  Subjective:    Patient ID: Virginia Montoya, female    DOB: [birth year], 69 y.o.   MRN: [id number]  Chief Complaint  Patient presents with  . Depression    Since March after she started the back pain.  . Back Pain   HPI: Patient is having significant depression and anxiety.  She is having frequent panic attacks. She takes citalopram 10 mg once daily, ambien 10 mg one at night, lorazepam 0.5mg  one twice a day for anxiety.  This is not helping with her panic attacks and anxiety.  I did give her Xanax in June 2021 however this did not help either.  The patient feels as if everything she does going to infect others in the adverse manner.  She is afraid she is going to get her entire family sick even though she has had her Covid vaccine and is very cautious.   Lumbar back pain with sciatica has improved with physical therapy but not fully resolved.  Patient had been referred to Dr. Marikay Alar with [facility] neurosurgery.  She had canceled this appointment since she was improving but now wishes to return.    Past Medical History:  Diagnosis Date  . Chronic insomnia   . Depression   . Gastroesophageal reflux disease   . Hyperlipidemia   . Osteoporosis   . Prediabetes     Past Surgical History:  Procedure Laterality Date  . CHOLECYSTECTOMY  1999  . CORONARY CALCIUM SCORE/CORONARY CTA  01/2017   Coronary calcium score is 0.  Normal coronary origin with normal right dominance.  No evidence of CAD.  Most likely noncardiac chest pain.  Marland Kitchen SHOULDER SURGERY  2005  . TONSILLECTOMY    . TUBAL LIGATION  1977    Family History  Problem Relation Age of Onset  . Diabetes Mother   . Hyperlipidemia Mother   . Emphysema Father   . Diabetes Maternal Grandmother   . Liver cancer Maternal Grandmother     Social History   Socioeconomic History  . Marital status: Married    Spouse name: Not on file  . Number of children: 3  . Years of education: 40  . Highest education  level: Associate degree: academic program  Occupational History  . Occupation: Retired  Tobacco Use  . Smoking status: Never Smoker  . Smokeless tobacco: Never Used  Substance and Sexual Activity  . Alcohol use: No  . Drug use: No  . Sexual activity: Not Currently  Other Topics Concern  . Not on file  Social History Narrative   She is a married (remarried) mother of 3 with 4 grandchildren.  She has 3 great-grandchildren.  She previously worked at [employer] in the [employer].  She completed high school and beauty school.   She works outside a lot in the yard on gardening.  She does a lot of walking and exercises with walking for at least a half an hour a day 5 days a week.   wears sunscreen, brushes and flosses daily, see's dentist bi-annually, has smoke/carbon monoxide detectors, wears a seatbelt and practices gun safety   Social Determinants of Health   Financial Resource Strain:   . Difficulty of Paying Living Expenses: Not on file  Food Insecurity:   . Worried About Programme researcher, broadcasting/film/video in the Last Year: Not on file  . Ran Out of Food in the Last Year: Not on file  Transportation Needs:   . Lack of Transportation (Medical): Not  on file  . Lack of Transportation (Non-Medical): Not on file  Physical Activity:   . Days of Exercise per Week: Not on file  . Minutes of Exercise per Session: Not on file  Stress:   . Feeling of Stress : Not on file  Social Connections:   . Frequency of Communication with Friends and Family: Not on file  . Frequency of Social Gatherings with Friends and Family: Not on file  . Attends Religious Services: Not on file  . Active Member of Clubs or Organizations: Not on file  . Attends Banker Meetings: Not on file  . Marital Status: Not on file  Intimate Partner Violence:   . Fear of Current or Ex-Partner: Not on file  . Emotionally Abused: Not on file  . Physically Abused: Not on file  . Sexually Abused: Not on file    Outpatient  Medications Prior to Visit  Medication Sig Dispense Refill  . acetaminophen (TYLENOL) 500 MG tablet Take 500 mg every 6 (six) hours as needed by mouth.    Marland Kitchen aspirin 325 MG EC tablet Take 325 mg as needed by mouth for pain.    . baclofen (LIORESAL) 10 MG tablet TAKE 1 TABLET(10 MG) BY MOUTH THREE TIMES DAILY 90 tablet 1  . ibuprofen (ADVIL) 600 MG tablet TAKE 1 TABLET(600 MG) BY MOUTH EVERY 8 HOURS AS NEEDED 90 tablet 0  . L-Methylfolate-B6-B12 1.13-25-2 MG TABS Take 1 tablet by mouth daily. 30 tablet 2  . Multiple Vitamins-Minerals (PRESERVISION AREDS PO) Take by mouth.    Marland Kitchen omeprazole (PRILOSEC) 20 MG capsule Take 20 mg by mouth.    . pyridOXINE (VITAMIN B-6) 100 MG tablet Take 100 mg by mouth daily.    . rosuvastatin (CRESTOR) 20 MG tablet TAKE 1 TABLET( 20 MG) BY MOUTH ONCE DAILY 90 tablet 1  . vitamin B-12 (CYANOCOBALAMIN) 1000 MCG tablet Take 1,000 mcg by mouth daily.    Marland Kitchen zolpidem (AMBIEN) 10 MG tablet TAKE 1 TABLET BY MOUTH AT BEDTIME 30 tablet 5  . citalopram (CELEXA) 20 MG tablet Take 20 mg by mouth daily.   2  . LORazepam (ATIVAN) 0.5 MG tablet TAKE 1 TABLET(0.5 MG) BY MOUTH TWICE DAILY 60 tablet 0  . alendronate (FOSAMAX) 70 MG tablet TAKE 1 TABLET(70 MG) BY MOUTH EVERY 7 DAYS WITH A FULL GLASS OF WATER AND ON AN EMPTY STOMACH 4 tablet 3  . ALPRAZolam (XANAX) 0.25 MG tablet Take 1 tablet (0.25 mg total) by mouth 2 (two) times daily as needed for anxiety. (Patient not taking: Reported on 08/29/2019) 60 tablet 2  . citalopram (CELEXA) 10 MG tablet Take 10 mg by mouth daily.     No facility-administered medications prior to visit.    Allergies  Allergen Reactions  . Cephalexin Nausea And Vomiting    ask  . Biaxin [Clarithromycin] Other (See Comments)    CHEST PAIN  . Sulfa Antibiotics     Made her feel poorly.     Review of Systems  Constitutional: Negative for chills and fever.  Respiratory: Negative for cough and shortness of breath.   Cardiovascular: Negative for chest  pain.  Gastrointestinal: Negative for abdominal pain and nausea.  Genitourinary: Positive for urgency. Negative for dysuria, flank pain, frequency and vaginal discharge.  Neurological: Negative for headaches.  Psychiatric/Behavioral: The patient is nervous/anxious.        Objective:    Physical Exam Constitutional:      Appearance: Normal appearance. She is normal weight.  Cardiovascular:     Rate and Rhythm: Normal rate and regular rhythm.  Pulmonary:     Effort: Pulmonary effort is normal.     Breath sounds: Normal breath sounds.  Abdominal:     General: Abdomen is flat. Bowel sounds are normal.     Palpations: Abdomen is soft. There is no mass.     Tenderness: There is no abdominal tenderness.  Psychiatric:     Comments: Patient cries throughout appointment.  Putting her face and her hands.  Avoiding eye contact.     BP (!) 150/80 (BP Location: Right Arm, Patient Position: Sitting)   Pulse 98   Temp 98 F (36.7 C) (Temporal)   Resp 17   Ht 5\' 6"  (1.676 m)   Wt 156 lb (70.8 kg)   SpO2 97%   BMI 25.18 kg/m  Wt Readings from Last 3 Encounters:  08/29/19 156 lb (70.8 kg)  06/14/19 163 lb (73.9 kg)  05/30/19 163 lb (73.9 kg)    Health Maintenance Due  Topic Date Due  . Hepatitis C Screening  Never done  . COLONOSCOPY  Never done  . PNA vac Low Risk Adult (2 of 2 - PCV13) 10/12/2017  . COVID-19 Vaccine (2 - Pfizer 2-dose series) 06/14/2019  . INFLUENZA VACCINE  08/05/2019    There are no preventive care reminders to display for this patient.   Lab Results  Component Value Date   TSH 2.510 03/06/2019   Lab Results  Component Value Date   WBC 6.1 05/02/2019   HGB 14.1 05/02/2019   HCT 43.3 05/02/2019   MCV 88 05/02/2019   PLT 320 05/02/2019   Lab Results  Component Value Date   NA 139 05/02/2019   K 4.4 05/02/2019   CO2 22 05/02/2019   GLUCOSE 104 (H) 05/02/2019   BUN 8 05/02/2019   CREATININE 0.63 05/02/2019   BILITOT 0.5 05/02/2019   ALKPHOS  113 05/02/2019   AST 16 05/02/2019   ALT 17 05/02/2019   PROT 6.5 05/02/2019   ALBUMIN 4.4 05/02/2019   CALCIUM 9.5 05/02/2019   Lab Results  Component Value Date   CHOL 136 05/02/2019   Lab Results  Component Value Date   HDL 43 05/02/2019   Lab Results  Component Value Date   LDLCALC 62 05/02/2019   Lab Results  Component Value Date   TRIG 190 (H) 05/02/2019   Lab Results  Component Value Date   CHOLHDL 3.2 05/02/2019   Lab Results  Component Value Date   HGBA1C 6.0 (H) 05/02/2019       Assessment & Plan:  1. GAD (generalized anxiety disorder) - concerning for OCD. Recommended start Prozac 20 mg once daily.  Stop citalopram. Continue lorazepam. Counselor: Call Dr. 05/04/2019 [phone]  2. Panic attacks Continue lorazepam.  3. Acute midline low back pain with left-sided sciatica Call for appointment. Referral is still good.  Dr. [phone] [facility] Address: [address] [place], [place], [place] Phone: [phone]    Follow-up: Return in about 4 weeks (around 09/26/2019).  An After Visit Summary was printed and given to the patient.  09/28/2019 [facility] [phone]

## 2019-08-29 NOTE — Patient Instructions (Signed)
Call for appointment. Referral is still good.  Dr. Marikay Alar [facility] Address: [address] [place], [place] [address] Phone: [phone]  Counselor Call Dr. Gerome Apley [phone]  Stop celexa.  Start prozac 20 mg once daily.    Managing Obsessive-Compulsive Disorder If you have been diagnosed with obsessive-compulsive disorder (OCD), you may be relieved that you now know why you have felt or behaved a certain way. You may also feel overwhelmed about the treatment ahead, how to get the support you need, and how to deal with the condition day-to-day. With treatment and support, you can manage your OCD. How to manage lifestyle changes Managing stress Stress is your body's reaction to life changes and events, both good and bad. Stress can play a major role in OCD, so it is important to learn how to manage stress. Some techniques to manage stress include:  Meditation, muscle relaxation, and breathing exercises.  Exercise. Even a short daily walk can help to lower stress levels.  Getting enough good-quality sleep.  Spending time on hobbies that you enjoy.  Accepting and letting go of things that you cannot change. To help you manage stress associated with OCD, your health care provider may recommend exposure and response prevention therapy. In this therapy, you will be exposed to the distressing situation that triggers your compulsion and be prevented from responding to it. With repetition of this process over time, you will no longer feel the distress or need to perform the compulsion. Medicines Your health care provider may suggest certain medicines for depression (antidepressants) if he or she feels that they will help to improve your condition. Avoid using alcohol and other substances that may prevent your medicines from working properly. It is also important to:  Talk with your pharmacist or health care provider about all medicines that you take, their  possible side effects, and which medicines are safe to take together.  Make it your goal to take part in all treatment decisions (shared decision-making). Ask about possible side effects of medicines that your health care provider recommends, and tell him or her how you feel about having those side effects. It is best if shared decision-making with your health care provider is part of your total treatment plan. If you are taking medicines as part of your treatment, do not stop taking them before you ask your health care provider if it is safe to stop. You may need to have the medicine slowly decreased (tapered) over time to lower the risk of harmful side effects. Relationships Consider giving education materials to friends and family. Your family and friends may need to learn about your OCD in order for them to understand you and support you as you manage your condition. Family therapy may also help to lower stress and relieve tension. How to recognize changes in your condition Some signs that your condition may be getting worse include:  Being anxious about germs or dirt.  Having harmful thoughts about yourself or others.  Making sure that household objects are alike or perfectly organized in a specific way.  Having great difficulty making decisions, or second-guessing yourself after making a decision.  Constant cleaning and handwashing.  Repeating behavior such as repeatedly checking to see if a door is locked or the oven is off.  Counting nonstop or uncontrollably. Follow these instructions at home:  Medicines  Take over-the-counter and prescription medicines only as told by your health care provider.  Check with your health care provider before starting  any new prescription or over-the-counter medicines. General instructions  Ask for support from trusted family members or friends to make sure you stay on track with your treatment.  Keep a journal to write down your daily moods,  medicines, sleep habits, and life events. Doing this may help you have more success with your treatment.  Maintain a healthy lifestyle. Eat a healthy diet, exercise regularly, get plenty of sleep, and take time to relax.  Keep all follow-up visits as told by your health care provider and therapist. This is important. Where to find support Talking to others It may be difficult to tell loved ones about your condition, but they can be a good support system for you. You can work with your therapist to decide whom to tell and when to tell them. Here are some tips for starting the conversation:  Start by sharing your experience with OCD. It is up to you how much detail you want to provide.  Let your loved ones know that you are seeking treatment.  Do not expect loved ones to understand your condition right away. Finances Not all insurance plans cover mental health care, so it is important to check with your insurance carrier. If paying for co-pays or counseling services is a problem, search for a local or county mental health care center. Public mental health care services may be offered there at a low cost or no cost when you are not able to see a private health care provider. If you are taking medicine for depression, you may be able to get the generic form, which may be less expensive than brand-name medicine. Some makers of prescription medicines also offer help to patients who cannot afford the medicines they need. Questions to ask your health care provider  If you are taking medicines: ? How long do I need to take medicine? ? Are there any long-term side effects of my medicine? ? Are there any alternatives to taking medicine?  How would I benefit from therapy?  How often should I follow up with a health care provider? Where to find more information  International OCD Foundation: www.iocdf.org  The [employer] on Mental Illness (NAMI): www.nami.org Contact a health care provider  if:  Your symptoms get worse or they do not get better with treatment.  You develop new symptoms. Get help right away if:  You have severe side effects after taking your medicine.  You have thoughts about hurting yourself or others. If you ever feel like you may hurt yourself or others, or have thoughts about taking your own life, get help right away. You can go to your nearest emergency department or call:  Your local emergency services (911 in the [place]  A suicide crisis helpline, such as the National Suicide Prevention Lifeline at 918 877 5070. This is open 24 hours a day. Summary  Stress can play a major role in obsessive-compulsive disorder (OCD). Learning ways to manage stress may help your treatment work better for you.  If you are taking medicines as part of your treatment, do not stop taking medicines before you ask your health care provider if it is safe to stop.  When talking with family members and friends about your OCD, decide how much detail you want to give them and be patient as they work to understand your condition.  Keep all follow-up visits as told by your health care provider and therapist. This is important. This information is not intended to replace advice given to you by your health  care provider. Make sure you discuss any questions you have with your health care provider. Document Revised: 04/14/2018 Document Reviewed: 04/22/2016 Elsevier Patient Education  2020 [employer].

## 2019-08-30 ENCOUNTER — Other Ambulatory Visit: Payer: Self-pay | Admitting: Family Medicine

## 2019-08-30 MED ORDER — FLUOXETINE HCL 20 MG PO CAPS
20.0000 mg | ORAL_CAPSULE | Freq: Every day | ORAL | 1 refills | Status: DC
Start: 2019-08-30 — End: 2019-09-04

## 2019-09-04 ENCOUNTER — Other Ambulatory Visit: Payer: Self-pay | Admitting: Family Medicine

## 2019-09-04 ENCOUNTER — Ambulatory Visit: Payer: PPO | Admitting: Family Medicine

## 2019-09-04 ENCOUNTER — Telehealth: Payer: Self-pay

## 2019-09-04 MED ORDER — LORAZEPAM 0.5 MG PO TABS: 0.5000 mg | ORAL_TABLET | Freq: Three times a day (TID) | ORAL | 0 refills | Status: DC | PRN

## 2019-09-04 MED ORDER — FLUOXETINE HCL 20 MG PO CAPS
20.0000 mg | ORAL_CAPSULE | Freq: Two times a day (BID) | ORAL | 0 refills | Status: DC
Start: 2019-09-04 — End: 2019-10-08

## 2019-09-04 NOTE — Telephone Encounter (Signed)
Virginia Montoya called with a panic attack.  She has been doing better since starting the prozac.  She was instructed to take the ativan that she has ordered and call us back if no improvement.  I will send message to Dr. Sedalia Muta to let her know that she continues to have attacks.  She is in the process of getting an appointment with a counselor.

## 2019-09-04 NOTE — Telephone Encounter (Signed)
Take lorazepam for panic attack until prozac gets in system. Kc

## 2019-09-11 ENCOUNTER — Telehealth: Payer: Self-pay

## 2019-09-11 ENCOUNTER — Other Ambulatory Visit: Payer: Self-pay | Admitting: Family Medicine

## 2019-09-11 NOTE — Telephone Encounter (Signed)
Virginia Montoya called having a panic attack.  She has been having attacks frequently with no improvement with her medication.  Dr. Sedalia Muta advised that she go to [facility] for evaluation.  Patient agreed to go.

## 2019-09-12 ENCOUNTER — Other Ambulatory Visit: Payer: Self-pay | Admitting: Physician Assistant

## 2019-09-13 ENCOUNTER — Other Ambulatory Visit: Payer: Self-pay | Admitting: Family Medicine

## 2019-09-13 ENCOUNTER — Other Ambulatory Visit: Payer: Self-pay

## 2019-09-13 MED ORDER — VENLAFAXINE HCL ER 75 MG PO CP24: 75.0000 mg | ORAL_CAPSULE | Freq: Every day | ORAL | 0 refills | Status: DC

## 2019-09-13 MED ORDER — LORAZEPAM 0.5 MG PO TABS
0.5000 mg | ORAL_TABLET | Freq: Three times a day (TID) | ORAL | 0 refills | Status: DC | PRN
Start: 2019-09-13 — End: 2019-11-12

## 2019-09-13 NOTE — Telephone Encounter (Signed)
Caryn Bee from [facility] in [place] called and stated that the patient's Lorazepam RX was not received on 09/04/2019 and they do not have a current RX and needing a new one sent over for the patient. He stated if there was any questions we could call him back at [phone].

## 2019-09-17 ENCOUNTER — Other Ambulatory Visit: Payer: Self-pay | Admitting: Physician Assistant

## 2019-09-24 ENCOUNTER — Ambulatory Visit: Payer: PPO | Admitting: Family Medicine

## 2019-09-24 ENCOUNTER — Telehealth: Payer: Self-pay

## 2019-09-24 NOTE — Telephone Encounter (Signed)
Virginia Montoya called and left a message that she had a friend who tested positive for covid yesterday.  She is asymptomatic but if questioning the need to be tested. I called her back and left a message that she needs to be tested 5 days after exposure or before if she develops symptoms.

## 2019-09-25 ENCOUNTER — Ambulatory Visit (INDEPENDENT_AMBULATORY_CARE_PROVIDER_SITE_OTHER): Payer: PPO

## 2019-09-25 DIAGNOSIS — Z20828 Contact with and (suspected) exposure to other viral communicable diseases: Secondary | ICD-10-CM | POA: Diagnosis not present

## 2019-09-25 NOTE — Progress Notes (Signed)
Patient Name: Virginia Montoya Date of Birth: [birth year] MRN:  [id number]  Virginia Montoya is a 69 y.o. yo female presenting for COVID-19 testing.  She is being tested from the vehicle.  [name] is being tested due to a known positive exposure.  Patient understands that she will be notified of result as soon as we receive them, usually within 24-48 hours.  Printed and verbal information given to patient according to CDC Guidelines.  Jacklynn Bue, LPN 9:14 AM

## 2019-09-25 NOTE — Addendum Note (Signed)
Addended by: Jacklynn Bue on: 09/25/2019 09:29 AM   Modules accepted: Level of Service

## 2019-09-27 LAB — NOVEL CORONAVIRUS, NAA: SARS-CoV-2, NAA: NOT DETECTED

## 2019-09-27 LAB — SARS-COV-2, NAA 2 DAY TAT

## 2019-09-28 ENCOUNTER — Ambulatory Visit: Payer: PPO | Admitting: Family Medicine

## 2019-10-08 ENCOUNTER — Encounter: Payer: Self-pay | Admitting: Family Medicine

## 2019-10-08 ENCOUNTER — Ambulatory Visit (INDEPENDENT_AMBULATORY_CARE_PROVIDER_SITE_OTHER): Payer: PPO | Admitting: Family Medicine

## 2019-10-08 ENCOUNTER — Other Ambulatory Visit: Payer: Self-pay

## 2019-10-08 VITALS — BP 140/70 | HR 100 | Temp 97.5°F | Resp 16 | Ht 66.0 in | Wt 149.4 lb

## 2019-10-08 DIAGNOSIS — E782 Mixed hyperlipidemia: Secondary | ICD-10-CM | POA: Diagnosis not present

## 2019-10-08 DIAGNOSIS — F410 Panic disorder [episodic paroxysmal anxiety] without agoraphobia: Secondary | ICD-10-CM

## 2019-10-08 DIAGNOSIS — M48062 Spinal stenosis, lumbar region with neurogenic claudication: Secondary | ICD-10-CM

## 2019-10-08 DIAGNOSIS — R7303 Prediabetes: Secondary | ICD-10-CM

## 2019-10-08 DIAGNOSIS — M810 Age-related osteoporosis without current pathological fracture: Secondary | ICD-10-CM

## 2019-10-08 DIAGNOSIS — Z23 Encounter for immunization: Secondary | ICD-10-CM | POA: Diagnosis not present

## 2019-10-08 DIAGNOSIS — F411 Generalized anxiety disorder: Secondary | ICD-10-CM | POA: Diagnosis not present

## 2019-10-08 DIAGNOSIS — F331 Major depressive disorder, recurrent, moderate: Secondary | ICD-10-CM

## 2019-10-08 MED ORDER — ROSUVASTATIN CALCIUM 20 MG PO TABS
ORAL_TABLET | ORAL | 1 refills | Status: DC
Start: 2019-10-08 — End: 2019-10-11

## 2019-10-08 MED ORDER — OMEPRAZOLE 20 MG PO CPDR
20.0000 mg | DELAYED_RELEASE_CAPSULE | Freq: Every day | ORAL | 3 refills | Status: DC
Start: 2019-10-08 — End: 2020-07-08

## 2019-10-08 MED ORDER — VENLAFAXINE HCL ER 75 MG PO CP24
75.0000 mg | ORAL_CAPSULE | Freq: Every day | ORAL | 1 refills | Status: DC
Start: 2019-10-08 — End: 2019-10-10

## 2019-10-08 NOTE — Progress Notes (Signed)
Subjective:  Patient ID: Virginia Montoya, female    DOB: 1950/10/16  Age: 69 y.o. MRN: [id number]  Chief Complaint  Patient presents with  . Hyperlipidemia  . Prediabetes    HPI Concerning her gastro-esophageal reflux disease without esophagitis, no associated symptoms are reported.  Symptoms are improved with a proton-pump inhibitor ( Prilosec ).      Pt presents with hyperlipidemia.  Date of diagnosis 03/03/2012.  Current treatment includes Crestor and low fat diet (most of the time.)  Compliance with treatment has been good; she maintains her low cholesterol diet, follows up as directed, and maintains her exercise regimen.      Virginia Montoya presents with a diagnosis of prediabetes.  The course has been stable and nonprogressive.  Eating healthy.      Virginia Montoya presents with major depressive disorder, recurrent, moderate.  This is a routine follow-up.  Date of diagnosis 2013.  Current medications include venlafaxine ER 75 mg once daily and lorazepam 0.5 mg once at bedtime only. No more panic attacks. Not crying.  Not worrying about her behavior adversely affects others. She has had no panic attacks.         Dx with primary insomnia; this has been noted for the past several years.  Current medications include zolpidem.  Helps her sleep  Patient is complaining of spinal stenosis of lumbar region. Not having any leg pain.  Xray showed scoliosis and arthritis. Using ibuprofen, tylenol intermittently. Completed PT. Overall improved. Pain increases with prolonged standing and ambulation. Resolves when she sits down.   Past Medical History:  Diagnosis Date  . Chronic insomnia   . Depression   . Gastroesophageal reflux disease   . Hyperlipidemia   . Osteoporosis   . Prediabetes    Past Surgical History:  Procedure Laterality Date  . CHOLECYSTECTOMY  1999  . CORONARY CALCIUM SCORE/CORONARY CTA  01/2017   Coronary calcium score is 0.  Normal coronary origin with normal right dominance.  No evidence of  CAD.  Most likely noncardiac chest pain.  Marland Kitchen SHOULDER SURGERY  2005  . TONSILLECTOMY    . TUBAL LIGATION  1977    Family History  Problem Relation Age of Onset  . Diabetes Mother   . Hyperlipidemia Mother   . Emphysema Father   . Diabetes Maternal Grandmother   . Liver cancer Maternal Grandmother    Social History   Socioeconomic History  . Marital status: Married    Spouse name: Not on file  . Number of children: 3  . Years of education: 37  . Highest education level: Associate degree: academic program  Occupational History  . Occupation: Retired  Tobacco Use  . Smoking status: Never Smoker  . Smokeless tobacco: Never Used  Substance and Sexual Activity  . Alcohol use: No  . Drug use: No  . Sexual activity: Not Currently  Other Topics Concern  . Not on file  Social History Narrative   She is a married (remarried) mother of 3 with 4 grandchildren.  She has 3 great-grandchildren.  She previously worked at [employer] in the [employer].  She completed high school and beauty school.   She works outside a lot in the yard on gardening.  She does a lot of walking and exercises with walking for at least a half an hour a day 5 days a week.   wears sunscreen, brushes and flosses daily, see's dentist bi-annually, has smoke/carbon monoxide detectors, wears a seatbelt and practices gun safety  Social Determinants of Health   Financial Resource Strain:   . Difficulty of Paying Living Expenses: Not on file  Food Insecurity:   . Worried About Programme researcher, broadcasting/film/video in the Last Year: Not on file  . Ran Out of Food in the Last Year: Not on file  Transportation Needs:   . Lack of Transportation (Medical): Not on file  . Lack of Transportation (Non-Medical): Not on file  Physical Activity:   . Days of Exercise per Week: Not on file  . Minutes of Exercise per Session: Not on file  Stress:   . Feeling of Stress : Not on file  Social Connections:   . Frequency of Communication with  Friends and Family: Not on file  . Frequency of Social Gatherings with Friends and Family: Not on file  . Attends Religious Services: Not on file  . Active Member of Clubs or Organizations: Not on file  . Attends Banker Meetings: Not on file  . Marital Status: Not on file    Review of Systems  Constitutional: Negative for chills, fatigue and fever.  HENT: Negative for congestion, ear pain, rhinorrhea and sore throat.   Respiratory: Negative for cough and shortness of breath.   Cardiovascular: Negative for chest pain.  Gastrointestinal: Negative for abdominal pain, constipation, diarrhea, nausea and vomiting.  Genitourinary: Negative for dysuria and urgency.  Musculoskeletal: Positive for arthralgias and back pain. Negative for myalgias.  Neurological: Negative for dizziness, weakness, light-headedness and headaches.  Psychiatric/Behavioral: Negative for dysphoric mood (Currenty being treated). The patient is not nervous/anxious.      Objective:  BP 140/70   Pulse 100   Temp (!) 97.5 F (36.4 C)   Resp 16   Ht 5\' 6"  (1.676 m)   Wt 149 lb 6.4 oz (67.8 kg)   BMI 24.11 kg/m   BP/Weight 10/08/2019 08/29/2019 06/14/2019  Systolic BP 140 150 150  Diastolic BP 70 80 70  Wt. (Lbs) 149.4 156 163  BMI 24.11 25.18 26.31    Physical Exam Constitutional:      Appearance: Normal appearance.  Cardiovascular:     Rate and Rhythm: Normal rate and regular rhythm.  Pulmonary:     Effort: Pulmonary effort is normal.     Breath sounds: Normal breath sounds.  Abdominal:     General: Abdomen is flat. Bowel sounds are normal.     Palpations: Abdomen is soft.     Tenderness: There is no abdominal tenderness.  Musculoskeletal:        General: No tenderness.  Skin:    General: Skin is warm.  Neurological:     Mental Status: She is alert and oriented to person, place, and time.     Gait: Gait normal.  Psychiatric:        Mood and Affect: Mood normal.        Behavior:  Behavior normal.     Lab Results  Component Value Date   WBC 6.1 05/02/2019   HGB 14.1 05/02/2019   HCT 43.3 05/02/2019   PLT 320 05/02/2019   GLUCOSE 104 (H) 05/02/2019   CHOL 136 05/02/2019   TRIG 190 (H) 05/02/2019   HDL 43 05/02/2019   LDLCALC 62 05/02/2019   ALT 17 05/02/2019   AST 16 05/02/2019   NA 139 05/02/2019   K 4.4 05/02/2019   CL 102 05/02/2019   CREATININE 0.63 05/02/2019   BUN 8 05/02/2019   CO2 22 05/02/2019   TSH 2.510 03/06/2019  HGBA1C 6.0 (H) 05/02/2019      Assessment & Plan:  1. Mixed hyperlipidemia Well controlled.  No changes to medicines.  Continue to work on eating a healthy diet and exercise.  Labs drawn today.  - Lipid panel - Comprehensive metabolic panel  2. Prediabetes Recommend continue to work on eating healthy diet and exercise. - CBC with Differential/Platelet - Hemoglobin A1c  3. Spinal stenosis of lumbosacral region Start on ibuprofen 600 mg once three times a day and baclofen 10 mg one three times a day as needed for muscle spasms. Continue physical therapy.  4. Mild recurrent major depression (HCC) The current medical regimen is effective;  continue present plan and medications.  5. Panic attacks The current medical regimen is effective;  continue present plan and medications.  6. GAD (generalized anxiety disorder) The current medical regimen is effective;  continue present plan and medications.  7. Age-related osteoporosis without current pathological fracture Recommend increase calcium containing foods.  Recommend  8. Flu vaccine FLUAD GIVEN.  Follow-up: Return in about 3 months (around 01/08/2020).  Time spent in visit: 40 minutes  An After Visit Summary was printed and given to the patient.  Blane Ohara [facility] [phone]

## 2019-10-08 NOTE — Patient Instructions (Signed)
Calcium Content in Foods Calcium is the most abundant mineral in your body. Most of your body's calcium supply is stored in your bones and teeth. Calcium helps many parts of the body function normally, including:  Blood and blood vessels.  Nerves.  Hormones.  Muscles.  Bones and teeth. When your calcium stores are low, you may be at risk for low bone mass, bone loss, and broken bones (fractures). When you get enough calcium, it helps to support strong bones and teeth throughout your life. Calcium is especially important for:  Children during growth spurts.  Girls during adolescence.  Women who are pregnant or breastfeeding.  Women after their menstrual cycle stops (postmenopause).  Women whose menstrual cycle has stopped due to anorexia nervosa or regular intense exercise.  People who cannot eat or digest dairy products.  Vegans. What are tips for getting more calcium? General information  Try to get most of your calcium from food. Eat foods that are high in calcium.  Some people may benefit from taking calcium supplements. Check with your health care provider or diet and nutrition specialist (dietitian) before starting any calcium supplements. Calcium supplements may interact with certain medicines. Too much calcium may cause other health problems, like constipation and kidney stones.  For the body to absorb calcium, it needs vitamin D. Sources of vitamin D include: ? Skin exposure to direct sunlight. ? Foods, such as egg yolks, liver, saltwater fish, and fortified milk. ? Vitamin D supplements. Check with your health care provider or dietitian before starting any vitamin D supplements. What foods are high in calcium?  High-calcium foods are those that contain more than 100 milligrams (mg) of calcium per serving. Fruits  Fortified orange or other fruit juice, 300 mg per 8 oz serving. Vegetables  Collard greens, 360 mg per 8 oz serving.  Kale, 180 mg per 8 oz  serving.  Bok choy, 160 mg per 8 oz serving. Grains  Fortified ready-to-eat cereals, 100-1,000 mg per 8 oz serving.  Fortified frozen waffles, 200 mg in two waffles. Meats and other proteins  Sardines, canned with bones, 325 mg per 3 oz serving.  Salmon, canned with bones, 180 mg per 3 oz serving.  Canned shrimp, 125 mg per 3 oz serving.  Baked beans, 160 mg per 4 oz serving. Dairy  Yogurt, plain, low-fat, 310 mg per 6 oz serving.  Milk, 300 mg per 8 oz serving.  American cheese, 195 mg per 1 oz serving.  Cheddar cheese, 205 mg per 1 oz serving.  Cottage cheese 2%, 105 mg per 4 oz serving.  Fortified soy, rice, or almond milk, 300 mg per 8 oz serving. The items listed above may not be a complete list of foods high in calcium. Actual amounts of calcium may be different depending on processing. Contact a dietitian for more information. What foods are lower in calcium? Foods lower in calcium are those that contain 50 mg of calcium or less per serving. Fruits  Apple, about 6 mg in one apple.  Banana, about 12 mg in one banana. Vegetables  Lettuce, 19 mg per 2 oz serving.  Tomato, about 11 mg in one tomato. Grains  Rice, 4 mg per 6 oz serving.  Boiled potatoes, 14 mg per 8 oz serving.  White bread, 6 mg in one slice. Meats and other proteins  Egg, 27 mg per 2 oz serving.  Red meat, 7 mg per 4 oz serving.  Chicken, 17 mg per 4 oz serving.  Fish, cod   or trout, 20 mg per 4 oz serving. The items listed above may not be a complete list of foods lower in calcium. Actual amounts of calcium may be different depending on processing. Contact a dietitian for more information. Summary  Calcium is an important mineral in the body because it affects many functions. Getting enough calcium helps support strong bones and teeth throughout your life.  Try to get most of your calcium from food.  Calcium supplements may interact with certain medicines. Check with your health  care provider before starting any calcium supplements. This information is not intended to replace advice given to you by your health care provider. Make sure you discuss any questions you have with your health care provider. Document Revised: 12/14/2016 Document Reviewed: 12/14/2016 Elsevier Patient Education  2020 Elsevier Inc.  

## 2019-10-09 ENCOUNTER — Telehealth: Payer: Self-pay | Admitting: Family Medicine

## 2019-10-09 LAB — COMPREHENSIVE METABOLIC PANEL
ALT: 18 IU/L (ref 0–32)
AST: 18 IU/L (ref 0–40)
Albumin/Globulin Ratio: 2.3 — ABNORMAL HIGH (ref 1.2–2.2)
Albumin: 4.6 g/dL (ref 3.7–4.7)
Alkaline Phosphatase: 74 IU/L (ref 44–121)
BUN/Creatinine Ratio: 15 (ref 12–28)
BUN: 8 mg/dL (ref 8–27)
Bilirubin Total: 0.4 mg/dL (ref 0.0–1.2)
CO2: 23 mmol/L (ref 20–29)
Calcium: 9.4 mg/dL (ref 8.7–10.3)
Chloride: 106 mmol/L (ref 96–106)
Creatinine, Ser: 0.55 mg/dL — ABNORMAL LOW (ref 0.57–1.00)
GFR calc Af Amer: 109 mL/min/{1.73_m2} (ref >59)
GFR calc non Af Amer: 95 mL/min/{1.73_m2} (ref >59)
Globulin, Total: 2 g/dL (ref 1.5–4.5)
Glucose: 121 mg/dL — ABNORMAL HIGH (ref 65–99)
Potassium: 4.7 mmol/L (ref 3.5–5.2)
Sodium: 142 mmol/L (ref 134–144)
Total Protein: 6.6 g/dL (ref 6.0–8.5)

## 2019-10-09 LAB — CBC WITH DIFFERENTIAL/PLATELET
Basophils Absolute: 0 10*3/uL (ref 0.0–0.2)
Basos: 0 %
EOS (ABSOLUTE): 0 10*3/uL (ref 0.0–0.4)
Eos: 1 %
Hematocrit: 44.9 % (ref 34.0–46.6)
Hemoglobin: 14.8 g/dL (ref 11.1–15.9)
Immature Grans (Abs): 0 10*3/uL (ref 0.0–0.1)
Immature Granulocytes: 0 %
Lymphocytes Absolute: 1.7 10*3/uL (ref 0.7–3.1)
Lymphs: 26 %
MCH: 29.1 pg (ref 26.6–33.0)
MCHC: 33 g/dL (ref 31.5–35.7)
MCV: 88 fL (ref 79–97)
Monocytes Absolute: 0.4 10*3/uL (ref 0.1–0.9)
Monocytes: 6 %
Neutrophils Absolute: 4.4 10*3/uL (ref 1.4–7.0)
Neutrophils: 67 %
Platelets: 359 10*3/uL (ref 150–450)
RBC: 5.09 x10E6/uL (ref 3.77–5.28)
RDW: 13.4 % (ref 11.7–15.4)
WBC: 6.6 10*3/uL (ref 3.4–10.8)

## 2019-10-09 LAB — LIPID PANEL
Chol/HDL Ratio: 2.5 ratio (ref 0.0–4.4)
Cholesterol, Total: 122 mg/dL (ref 100–199)
HDL: 49 mg/dL (ref >39)
LDL Chol Calc (NIH): 49 mg/dL (ref 0–99)
Triglycerides: 136 mg/dL (ref 0–149)
VLDL Cholesterol Cal: 24 mg/dL (ref 5–40)

## 2019-10-09 LAB — CARDIOVASCULAR RISK ASSESSMENT

## 2019-10-09 LAB — HEMOGLOBIN A1C
Est. average glucose Bld gHb Est-mCnc: 131 mg/dL
Hgb A1c MFr Bld: 6.2 % — ABNORMAL HIGH (ref 4.8–5.6)

## 2019-10-09 LAB — VITAMIN D 25 HYDROXY (VIT D DEFICIENCY, FRACTURES): Vit D, 25-Hydroxy: 57.8 ng/mL (ref 30.0–100.0)

## 2019-10-09 NOTE — Progress Notes (Signed)
  Chronic Care Management   Outreach Note  10/09/2019 Name: Virginia Montoya MRN: [id number] DOB: 11-23-1950  Referred by: Blane Ohara, MD Reason for referral : Chronic Care Management   An unsuccessful telephone outreach was attempted today. The patient was referred to the pharmacist for assistance with care management and care coordination.   Follow Up Plan:   Virginia Montoya  Upstream Scheduler

## 2019-10-10 ENCOUNTER — Other Ambulatory Visit: Payer: Self-pay | Admitting: Family Medicine

## 2019-10-11 ENCOUNTER — Other Ambulatory Visit: Payer: Self-pay | Admitting: Family Medicine

## 2019-10-11 ENCOUNTER — Telehealth: Payer: Self-pay | Admitting: Family Medicine

## 2019-10-11 NOTE — Chronic Care Management (AMB) (Signed)
  Chronic Care Management   Outreach Note  10/11/2019 Name: Virginia Montoya MRN: [id number] DOB: 11-27-1950  Referred by: Blane Ohara, MD Reason for referral : Chronic Care Management   A second unsuccessful telephone outreach was attempted today. The patient was referred to pharmacist for assistance with care management and care coordination.  Follow Up Plan:   Aggie Hacker  Upstream Scheduler

## 2019-10-15 ENCOUNTER — Telehealth: Payer: Self-pay | Admitting: Family Medicine

## 2019-10-15 NOTE — Progress Notes (Signed)
  Chronic Care Management   Outreach Note  10/15/2019 Name: CHYENNE SOBCZAK MRN: [id number] DOB: 21-May-1950  Referred by: Blane Ohara, MD Reason for referral : Chronic Care Management   Third unsuccessful telephone outreach was attempted today. The patient was referred to the pharmacist for assistance with care management and care coordination.   Follow Up Plan:   Aggie Hacker  Upstream Scheduler

## 2019-10-22 DIAGNOSIS — F339 Major depressive disorder, recurrent, unspecified: Secondary | ICD-10-CM | POA: Diagnosis not present

## 2019-11-09 ENCOUNTER — Other Ambulatory Visit: Payer: Self-pay | Admitting: Family Medicine

## 2019-11-15 ENCOUNTER — Ambulatory Visit: Payer: PPO | Admitting: Family Medicine

## 2019-11-19 ENCOUNTER — Encounter: Payer: Self-pay | Admitting: Family Medicine

## 2019-11-19 ENCOUNTER — Telehealth (INDEPENDENT_AMBULATORY_CARE_PROVIDER_SITE_OTHER): Payer: PPO | Admitting: Family Medicine

## 2019-11-19 DIAGNOSIS — J329 Chronic sinusitis, unspecified: Secondary | ICD-10-CM | POA: Diagnosis not present

## 2019-11-19 MED ORDER — DOXYCYCLINE HYCLATE 100 MG PO TABS: 100.0000 mg | ORAL_TABLET | Freq: Two times a day (BID) | ORAL | 0 refills | Status: DC

## 2019-11-19 NOTE — Addendum Note (Signed)
Addended by: Wandra Feinstein on: 11/19/2019 11:23 AM   Modules accepted: Level of Service

## 2019-11-19 NOTE — Progress Notes (Signed)
Virtual Visit via Telephone Note  I connected with Virginia Montoya on 11/19/19 at 11:00 AM EST by telephone and verified that I am speaking with the correct person using two identifiers.  Location: Patient: home Provider: clinic   I discussed the limitations, risks, security and privacy concerns of performing an evaluation and management service by telephone and the availability of in person appointments. I also discussed with the patient that there may be a patient responsible charge related to this service. The patient expressed understanding and agreed to proceed.   History of Present Illness: Pt with onset of symptoms last Tuesday. Pt emptied out boxes in storage units-packed for 5+ year. Nasal drainage-progressing to facial pressure. Forehead and tooth pain.  Pt felt allergies due to dust at first but now worsening symptoms over the last few days. Pt states left overs from moving. Pt states on Thursday cough and sore throat with cough. Pt with gland swollen. Gums painful on the right side.  Friday with cough increasing. Sat mucous yellow. Pt states cough intermittently with sore throat.  Over the counter-Claritin-"makes me funny feeling"  Sinus infections 1-2 /year. No tob use.   COVID vaccines x 2 , flu vaccine Observations/Objective: Temp 99.8-two times in the last week BP 136/81 Assessment and Plan: 1. Sinusitis, unspecified chronicity, unspecified location Mucinex/saline drops flonase Tylenol Doxy-rx  Follow Up Instructions: clinic appointment if no improvement    I discussed the assessment and treatment plan with the patient. The patient was provided an opportunity to ask questions and all were answered. The patient agreed with the plan and demonstrated an understanding of the instructions.   The patient was advised to call back or seek an in-person evaluation if the symptoms worsen or if the condition fails to improve as anticipated.  I provided 7 minutes of non-face-to-face  time during this encounter.   Kyrillos Adams Mat Carne, MD

## 2019-12-03 ENCOUNTER — Other Ambulatory Visit: Payer: Self-pay

## 2019-12-03 ENCOUNTER — Ambulatory Visit (INDEPENDENT_AMBULATORY_CARE_PROVIDER_SITE_OTHER): Payer: PPO | Admitting: Family Medicine

## 2019-12-03 VITALS — HR 92 | Temp 97.2°F | Ht 66.0 in | Wt 154.0 lb

## 2019-12-03 DIAGNOSIS — M5442 Lumbago with sciatica, left side: Secondary | ICD-10-CM

## 2019-12-03 DIAGNOSIS — Z0000 Encounter for general adult medical examination without abnormal findings: Secondary | ICD-10-CM

## 2019-12-03 MED ORDER — VENLAFAXINE HCL ER 37.5 MG PO CP24: 37.5000 mg | ORAL_CAPSULE | Freq: Every day | ORAL | 0 refills | Status: DC

## 2019-12-03 NOTE — Progress Notes (Addendum)
Subjective:  Patient ID: Virginia Montoya, female    DOB: May 06, 1950  Age: 69 y.o. MRN: [id number]  Chief Complaint  Patient presents with  . Annual Exam   HPI Encounter for general adult medical examination without abnormal findings  Physical ("At Risk" items are starred): Patient's last physical exam was 1 year ago .  Smoking: Life-long non-smoker ;  Physical Activity: Very active; eats healthy sometimes Alcohol/Drug Use: Is a non-drinker ; No illicit drug use ;  Patient is not afflicted from Stress Incontinence and Urge Incontinence  Safety: reviewed ; Patient wears a seat belt, has smoke detectors, practices appropriate gun safety, and wears sunscreen with extended sun exposure. Dental Care: biannual cleanings, brushes but does not floss daily. Ophthalmology/Optometry: Annual visit.  Hearing loss: none Vision impairments: Wears glasses- last seen 09/2019 @ [facility] Was 11 when she started her menses GAD/Depression are well controlled, but pt would like to decrease her effexor xr 37.5 mg once daily in am.   DEXA 02/26/2019  MAMMO  02/26/2019  Fall Risk  12/03/2019 05/02/2019  Falls in the past year? 0 1  Number falls in past yr: 0 1  Injury with Fall? 0 0  Risk for fall due to : - History of fall(s)  Follow up Falls evaluation completed Falls evaluation completed     Depression screen [facility] 2/9 12/03/2019 12/03/2019 08/29/2019 05/30/2019  Decreased Interest 0 0 3 0  Down, Depressed, Hopeless 0 0 2 2  PHQ - 2 Score 0 0 5 2  Altered sleeping - - 0 0  Tired, decreased energy - - 0 0  Change in appetite - - 1 0  Feeling bad or failure about yourself  - - 3 0  Trouble concentrating - - 0 0  Moving slowly or fidgety/restless - - - 0  Suicidal thoughts - - 0 0  PHQ-9 Score - - 9 2  Difficult doing work/chores - - - Somewhat difficult       Functional Status Survey: Is the patient deaf or have difficulty hearing?: No Does the patient have difficulty seeing, even  when wearing glasses/contacts?: No Does the patient have difficulty concentrating, remembering, or making decisions?: No Does the patient have difficulty walking or climbing stairs?: No Does the patient have difficulty dressing or bathing?: No Does the patient have difficulty doing errands alone such as visiting a doctor's office or shopping?: No   Social Hx   Social History   Socioeconomic History  . Marital status: Married    Spouse name: Not on file  . Number of children: 3  . Years of education: 41  . Highest education level: Associate degree: academic program  Occupational History  . Occupation: Retired  Tobacco Use  . Smoking status: Never Smoker  . Smokeless tobacco: Never Used  Substance and Sexual Activity  . Alcohol use: No  . Drug use: No  . Sexual activity: Not Currently  Other Topics Concern  . Not on file  Social History Narrative   She is a married (remarried) mother of 3 with 4 grandchildren.  She has 3 great-grandchildren.  She previously worked at [employer] in the [employer].  She completed high school and beauty school.   She works outside a lot in the yard on gardening.  She does a lot of walking and exercises with walking for at least a half an hour a day 5 days a week.   wears sunscreen, brushes and flosses daily, see's dentist bi-annually, has  smoke/carbon monoxide detectors, wears a seatbelt and practices gun safety   Social Determinants of Health   Financial Resource Strain:   . Difficulty of Paying Living Expenses: Not on file  Food Insecurity:   . Worried About Programme researcher, broadcasting/film/video in the Last Year: Not on file  . Ran Out of Food in the Last Year: Not on file  Transportation Needs:   . Lack of Transportation (Medical): Not on file  . Lack of Transportation (Non-Medical): Not on file  Physical Activity:   . Days of Exercise per Week: Not on file  . Minutes of Exercise per Session: Not on file  Stress:   . Feeling of Stress : Not on file   Social Connections:   . Frequency of Communication with Friends and Family: Not on file  . Frequency of Social Gatherings with Friends and Family: Not on file  . Attends Religious Services: Not on file  . Active Member of Clubs or Organizations: Not on file  . Attends Banker Meetings: Not on file  . Marital Status: Not on file   Past Medical History:  Diagnosis Date  . Chronic insomnia   . Depression   . Gastroesophageal reflux disease   . Hyperlipidemia   . Osteoporosis   . Prediabetes    Family History  Problem Relation Age of Onset  . Diabetes Mother   . Hyperlipidemia Mother   . Emphysema Father   . Diabetes Maternal Grandmother   . Liver cancer Maternal Grandmother     Review of Systems  Constitutional: Negative for chills, fatigue and fever.  HENT: Negative for congestion, ear pain, rhinorrhea and sore throat.   Respiratory: Positive for cough. Negative for shortness of breath.   Cardiovascular: Negative for chest pain.  Gastrointestinal: Negative for abdominal pain, constipation, diarrhea, nausea and vomiting.  Genitourinary: Negative for dysuria and urgency.  Musculoskeletal: Negative for back pain and myalgias.  Neurological: Negative for dizziness, weakness, light-headedness and headaches.  Psychiatric/Behavioral: Negative for dysphoric mood. The patient is not nervous/anxious.      Objective:  Pulse 92   Temp (!) 97.2 F (36.2 C)   Ht 5\' 6"  (1.676 m)   Wt 154 lb (69.9 kg)   SpO2 99%   BMI 24.86 kg/m   BP/Weight 12/03/2019 10/08/2019 08/29/2019  Systolic BP - 140 150  Diastolic BP - 70 80  Wt. (Lbs) 154 149.4 156  BMI 24.86 24.11 25.18    Physical Exam Vitals reviewed.  Constitutional:      Appearance: Normal appearance.  Cardiovascular:     Rate and Rhythm: Normal rate and regular rhythm.     Heart sounds: Normal heart sounds.  Pulmonary:     Effort: Pulmonary effort is normal.     Breath sounds: Normal breath sounds.   Neurological:     Mental Status: She is alert and oriented to person, place, and time.  Psychiatric:        Mood and Affect: Mood normal.        Behavior: Behavior normal.     Lab Results  Component Value Date   WBC 6.6 10/08/2019   HGB 14.8 10/08/2019   HCT 44.9 10/08/2019   PLT 359 10/08/2019   GLUCOSE 121 (H) 10/08/2019   CHOL 122 10/08/2019   TRIG 136 10/08/2019   HDL 49 10/08/2019   LDLCALC 49 10/08/2019   ALT 18 10/08/2019   AST 18 10/08/2019   NA 142 10/08/2019   K 4.7 10/08/2019  CL 106 10/08/2019   CREATININE 0.55 (L) 10/08/2019   BUN 8 10/08/2019   CO2 23 10/08/2019   TSH 2.510 03/06/2019   HGBA1C 6.2 (H) 10/08/2019      Assessment & Plan:  1. Encounter for Medicare annual wellness exam Continue to eat healthy.  Recommend walking for exercise. Bring health care power of attorney papers next visit.  Recommend [facility] for routine dental care.  Decrease venlafaxine ER 37.5 mg once in am. If pt, has any recurrence of depression, anxiety or panic attacks, she should immediately return to 75 mg daily. I do not feel she is ready to decrease this, but I will abide by her wishes.  2. Lumbar back pain with radiculopathy - improved, but not resolved. Flares up . Refer to [employer] for possible ESI.  Meds ordered this encounter  Medications  . venlafaxine XR (EFFEXOR XR) 37.5 MG 24 hr capsule    Sig: Take 1 capsule (37.5 mg total) by mouth daily with breakfast.    Dispense:  30 capsule    Refill:  0   This is a list of the screening recommended for you and due dates:  Health Maintenance  Topic Date Due  .  Hepatitis C: One time screening is recommended by Center for Disease Control  (CDC) for  adults born from [birth year] through 1967.   Never done  . Colon Cancer Screening  Never done  . Pneumonia vaccines (2 of 2 - PCV13) 10/12/2017  . Mammogram  02/25/2021  . Tetanus Vaccine  10/24/2026  . Flu Shot  Completed  . DEXA scan (bone density  measurement)  Completed  . COVID-19 Vaccine  Completed     AN INDIVIDUALIZED CARE PLAN: was established or reinforced today.   SELF MANAGEMENT: The patient and I together assessed ways to personally work towards obtaining the recommended goals  Support needs The patient and/or family needs were assessed and services were offered and not necessary at this time.    Follow-up: Return in about 1 year (around 12/02/2020) for AWV.  Blane Ohara, MD [facility] [phone]

## 2019-12-03 NOTE — Patient Instructions (Addendum)
Bring health care power of attorney papers next visit.  Recommend [facility] for routine dental care.  Decrease venlafaxine ER 37.5 mg once in am.   Preventive Care 65 Years and Older, Female Preventive care refers to lifestyle choices and visits with your health care provider that can promote health and wellness. This includes:  A yearly physical exam. This is also called an annual well check.  Regular dental and eye exams.  Immunizations.  Screening for certain conditions.  Healthy lifestyle choices, such as diet and exercise. What can I expect for my preventive care visit? Physical exam Your health care provider will check:  Height and weight. These may be used to calculate body mass index (BMI), which is a measurement that tells if you are at a healthy weight.  Heart rate and blood pressure.  Your skin for abnormal spots. Counseling Your health care provider may ask you questions about:  Alcohol, tobacco, and drug use.  Emotional well-being.  Home and relationship well-being.  Sexual activity.  Eating habits.  History of falls.  Memory and ability to understand (cognition).  Work and work Astronomer.  Pregnancy and menstrual history. What immunizations do I need?  Influenza (flu) vaccine  This is recommended every year. Tetanus, diphtheria, and pertussis (Tdap) vaccine  You may need a Td booster every 10 years. Varicella (chickenpox) vaccine  You may need this vaccine if you have not already been vaccinated. Zoster (shingles) vaccine  You may need this after age 78. Pneumococcal conjugate (PCV13) vaccine  One dose is recommended after age 53. Pneumococcal polysaccharide (PPSV23) vaccine  One dose is recommended after age 14. Measles, mumps, and rubella (MMR) vaccine  You may need at least one dose of MMR if you were born in 1957 or later. You may also need a second dose. Meningococcal conjugate (MenACWY) vaccine  You may need this  if you have certain conditions. Hepatitis A vaccine  You may need this if you have certain conditions or if you travel or work in places where you may be exposed to hepatitis A. Hepatitis B vaccine  You may need this if you have certain conditions or if you travel or work in places where you may be exposed to hepatitis B. Haemophilus influenzae type b (Hib) vaccine  You may need this if you have certain conditions. You may receive vaccines as individual doses or as more than one vaccine together in one shot (combination vaccines). Talk with your health care provider about the risks and benefits of combination vaccines. What tests do I need? Blood tests  Lipid and cholesterol levels. These may be checked every 5 years, or more frequently depending on your overall health.  Hepatitis C test.  Hepatitis B test. Screening  Lung cancer screening. You may have this screening every year starting at age 70 if you have a 30-pack-year history of smoking and currently smoke or have quit within the past 15 years.  Colorectal cancer screening. All adults should have this screening starting at age 63 and continuing until age 70. Your health care provider may recommend screening at age 7 if you are at increased risk. You will have tests every 1-10 years, depending on your results and the type of screening test.  Diabetes screening. This is done by checking your blood sugar (glucose) after you have not eaten for a while (fasting). You may have this done every 1-3 years.  Mammogram. This may be done every 1-2 years. Talk with your health care provider about  how often you should have regular mammograms.  BRCA-related cancer screening. This may be done if you have a family history of breast, ovarian, tubal, or peritoneal cancers. Other tests  Sexually transmitted disease (STD) testing.  Bone density scan. This is done to screen for osteoporosis. You may have this done starting at age 59. Follow these  instructions at home: Eating and drinking  Eat a diet that includes fresh fruits and vegetables, whole grains, lean protein, and low-fat dairy products. Limit your intake of foods with high amounts of sugar, saturated fats, and salt.  Take vitamin and mineral supplements as recommended by your health care provider.  Do not drink alcohol if your health care provider tells you not to drink.  If you drink alcohol: ? Limit how much you have to 0-1 drink a day. ? Be aware of how much alcohol is in your drink. In the [place] one drink equals one 12 oz bottle of beer (355 mL), one 5 oz glass of wine (148 mL), or one 1 oz glass of hard liquor (44 mL). Lifestyle  Take daily care of your teeth and gums.  Stay active. Exercise for at least 30 minutes on 5 or more days each week.  Do not use any products that contain nicotine or tobacco, such as cigarettes, e-cigarettes, and chewing tobacco. If you need help quitting, ask your health care provider.  If you are sexually active, practice safe sex. Use a condom or other form of protection in order to prevent STIs (sexually transmitted infections).  Talk with your health care provider about taking a low-dose aspirin or statin. What's next?  Go to your health care provider once a year for a well check visit.  Ask your health care provider how often you should have your eyes and teeth checked.  Stay up to date on all vaccines. This information is not intended to replace advice given to you by your health care provider. Make sure you discuss any questions you have with your health care provider. Document Revised: 12/15/2017 Document Reviewed: 12/15/2017 Elsevier Patient Education  2020 [employer].

## 2019-12-05 DIAGNOSIS — H16223 Keratoconjunctivitis sicca, not specified as Sjogren's, bilateral: Secondary | ICD-10-CM | POA: Diagnosis not present

## 2019-12-05 DIAGNOSIS — H353130 Nonexudative age-related macular degeneration, bilateral, stage unspecified: Secondary | ICD-10-CM | POA: Diagnosis not present

## 2019-12-05 DIAGNOSIS — H26493 Other secondary cataract, bilateral: Secondary | ICD-10-CM | POA: Diagnosis not present

## 2019-12-10 ENCOUNTER — Other Ambulatory Visit: Payer: Self-pay | Admitting: Family Medicine

## 2019-12-19 ENCOUNTER — Telehealth: Payer: Self-pay

## 2019-12-19 NOTE — Telephone Encounter (Signed)
Dr. Sedalia Muta please advise.  Pt called in asking about a referral she said that was mentioned at her appointment to someone for her back pain. She thought it was for Dr. Orrin Brigham but I couldn't find this in any of your notes.

## 2019-12-19 NOTE — Telephone Encounter (Signed)
I added an addendum and sent referral to you. Thanks Wilkie Aye, Dr. Sedalia Muta

## 2019-12-19 NOTE — Addendum Note (Signed)
Addended byBlane Ohara on: 12/19/2019 04:58 PM   Modules accepted: Orders

## 2019-12-24 ENCOUNTER — Encounter: Payer: Self-pay | Admitting: Family Medicine

## 2020-01-02 ENCOUNTER — Other Ambulatory Visit: Payer: Self-pay | Admitting: Family Medicine

## 2020-01-08 DIAGNOSIS — H26492 Other secondary cataract, left eye: Secondary | ICD-10-CM | POA: Diagnosis not present

## 2020-01-09 ENCOUNTER — Other Ambulatory Visit: Payer: Self-pay | Admitting: Family Medicine

## 2020-01-15 DIAGNOSIS — S32010A Wedge compression fracture of first lumbar vertebra, initial encounter for closed fracture: Secondary | ICD-10-CM | POA: Diagnosis not present

## 2020-01-15 DIAGNOSIS — S22080A Wedge compression fracture of T11-T12 vertebra, initial encounter for closed fracture: Secondary | ICD-10-CM | POA: Diagnosis not present

## 2020-01-17 ENCOUNTER — Ambulatory Visit: Payer: PPO | Admitting: Family Medicine

## 2020-02-04 ENCOUNTER — Ambulatory Visit: Payer: PPO | Admitting: Family Medicine

## 2020-02-15 DIAGNOSIS — Z09 Encounter for follow-up examination after completed treatment for conditions other than malignant neoplasm: Secondary | ICD-10-CM | POA: Diagnosis not present

## 2020-02-15 DIAGNOSIS — H26491 Other secondary cataract, right eye: Secondary | ICD-10-CM | POA: Diagnosis not present

## 2020-02-20 NOTE — Progress Notes (Signed)
Subjective:  Patient ID: Virginia Montoya, female    DOB: 03/04/1950  Age: 70 y.o. MRN: [id number]  Chief Complaint  Patient presents with  . Diabetes  . Hyperlipidemia  . Hypertension    HPI Mixed hyperlipidemia-Crestor 20 mg once daily at night. Not eating healthy.  Prediabetes: not eating healthy, but has cut back on potatoes. Does not eat sweets. Watches portions.  Exercising with household activities and plays with her 6 year old grandchild  Moderate recurrent major depression (HCC)-Effexor GAD (generalized anxiety disorder)-Effexor xr - tried to decrease to 37.5 mg, but increased back to 75 mg daily because her daughter was diagnosed with cervical cancer stage III. Takes lorazepam. Takes total of 2 daily sometimes, but broken in half and spread out through the day. GERD: omeprazole 20 mg once daily.    Current Outpatient Medications on File Prior to Visit  Medication Sig Dispense Refill  . acetaminophen (TYLENOL) 500 MG tablet Take 500 mg every 6 (six) hours as needed by mouth.    . D-Mannose 500 MG CAPS Take 500 mg by mouth daily.    . diclofenac (VOLTAREN) 75 MG EC tablet Take 75 mg by mouth 2 (two) times daily.    Marland Kitchen LORazepam (ATIVAN) 0.5 MG tablet TAKE 1 TABLET(0.5 MG) BY MOUTH EVERY 8 HOURS AS NEEDED FOR ANXIETY 90 tablet 1  . omeprazole (PRILOSEC) 20 MG capsule Take 1 capsule (20 mg total) by mouth daily. 90 capsule 3  . pyridOXINE (VITAMIN B-6) 100 MG tablet Take 100 mg by mouth daily.    . rosuvastatin (CRESTOR) 20 MG tablet TAKE 1 TABLET(20 MG) BY MOUTH EVERY DAY 90 tablet 1  . venlafaxine XR (EFFEXOR-XR) 75 MG 24 hr capsule Take 75 mg by mouth daily.    . vitamin B-12 (CYANOCOBALAMIN) 1000 MCG tablet Take 1,000 mcg by mouth daily.    Marland Kitchen zolpidem (AMBIEN) 10 MG tablet TAKE 1 TABLET BY MOUTH AT BEDTIME 30 tablet 3   No current facility-administered medications on file prior to visit.   Past Medical History:  Diagnosis Date  . Chronic insomnia   . Depression   .  Gastroesophageal reflux disease   . Hyperlipidemia   . Osteoporosis   . Prediabetes    Past Surgical History:  Procedure Laterality Date  . CHOLECYSTECTOMY  1999  . CORONARY CALCIUM SCORE/CORONARY CTA  01/2017   Coronary calcium score is 0.  Normal coronary origin with normal right dominance.  No evidence of CAD.  Most likely noncardiac chest pain.  Marland Kitchen SHOULDER SURGERY  2005  . TONSILLECTOMY    . TUBAL LIGATION  1977    Family History  Problem Relation Age of Onset  . Diabetes Mother   . Hyperlipidemia Mother   . Emphysema Father   . Diabetes Maternal Grandmother   . Liver cancer Maternal Grandmother    Social History   Socioeconomic History  . Marital status: Married    Spouse name: Not on file  . Number of children: 3  . Years of education: 73  . Highest education level: Associate degree: academic program  Occupational History  . Occupation: Retired  Tobacco Use  . Smoking status: Never Smoker  . Smokeless tobacco: Never Used  Substance and Sexual Activity  . Alcohol use: No  . Drug use: No  . Sexual activity: Not Currently  Other Topics Concern  . Not on file  Social History Narrative   She is a married (remarried) mother of 3 with 4 grandchildren.  She  has 3 great-grandchildren.  She previously worked at [employer] in the [employer].  She completed high school and beauty school.   She works outside a lot in the yard on gardening.  She does a lot of walking and exercises with walking for at least a half an hour a day 5 days a week.   wears sunscreen, brushes and flosses daily, see's dentist bi-annually, has smoke/carbon monoxide detectors, wears a seatbelt and practices gun safety   Social Determinants of Health   Financial Resource Strain: Not on file  Food Insecurity: Not on file  Transportation Needs: Not on file  Physical Activity: Not on file  Stress: Not on file  Social Connections: Not on file    Review of Systems  Constitutional: Negative for  chills, fatigue and fever.  HENT: Negative for congestion, ear pain, rhinorrhea and sore throat.   Respiratory: Negative for cough and shortness of breath.   Cardiovascular: Negative for chest pain and palpitations.  Gastrointestinal: Negative for abdominal pain, constipation, diarrhea, nausea and vomiting.  Genitourinary: Negative for dysuria and urgency.  Musculoskeletal: Positive for back pain (Pt saw Dr. Loralie Champagne, who was unable to help with injections or surgery. Given diclofenac . ). Negative for myalgias.  Neurological: Negative for dizziness, weakness, light-headedness and headaches.  Psychiatric/Behavioral: Negative for dysphoric mood. The patient is not nervous/anxious.      Objective:  BP 124/68   Pulse 80   Temp (!) 97.2 F (36.2 C)   Ht 5\' 6"  (1.676 m)   Wt 152 lb (68.9 kg)   BMI 24.53 kg/m   BP/Weight 02/21/2020 12/03/2019 10/08/2019  Systolic BP 124 - 140  Diastolic BP 68 - 70  Wt. (Lbs) 152 154 149.4  BMI 24.53 24.86 24.11    Physical Exam Vitals reviewed.  Constitutional:      Appearance: Normal appearance. She is normal weight.  Neck:     Vascular: No carotid bruit.  Cardiovascular:     Rate and Rhythm: Normal rate and regular rhythm.     Pulses: Normal pulses.     Heart sounds: Normal heart sounds.  Pulmonary:     Effort: Pulmonary effort is normal. No respiratory distress.     Breath sounds: Normal breath sounds.  Abdominal:     General: Abdomen is flat. Bowel sounds are normal.     Palpations: Abdomen is soft.     Tenderness: There is no abdominal tenderness.  Neurological:     Mental Status: She is alert and oriented to person, place, and time.  Psychiatric:        Mood and Affect: Mood normal.        Behavior: Behavior normal.     Diabetic Foot Exam - Simple   No data filed      Lab Results  Component Value Date   WBC 6.7 02/21/2020   HGB 14.9 02/21/2020   HCT 44.9 02/21/2020   PLT 318 02/21/2020   GLUCOSE 102 (H) 02/21/2020   CHOL  167 02/21/2020   TRIG 181 (H) 02/21/2020   HDL 55 02/21/2020   LDLCALC 81 02/21/2020   ALT 15 02/21/2020   AST 21 02/21/2020   NA 139 02/21/2020   K 4.8 02/21/2020   CL 101 02/21/2020   CREATININE 0.55 (L) 02/21/2020   BUN 11 02/21/2020   CO2 22 02/21/2020   TSH 2.510 03/06/2019   HGBA1C 6.1 (H) 02/21/2020      Assessment & Plan:   1. Mixed hyperlipidemia Fairly well controlled.  No changes to medicines.  Continue to work on eating a healthy diet and exercise.  Labs drawn today.  - Lipid panel - Comprehensive metabolic panel - Cardiovascular Risk Assessment  2. Prediabetes Recommend continue to work on eating healthy diet and exercise. - CBC with Differential/Platelet - Hemoglobin A1c  3. Moderate recurrent major depression (HCC) The current medical regimen is effective;  continue present plan and medications.  4. GAD (generalized anxiety disorder)  The current medical regimen is effective;  continue present plan and medications.  Orders Placed This Encounter  Procedures  . CBC with Differential/Platelet  . Lipid panel  . Hemoglobin A1c  . Comprehensive metabolic panel  . Cardiovascular Risk Assessment     Follow-up: Return in about 3 months (around 05/20/2020).  An After Visit Summary was printed and given to the patient.  Blane Ohara, MD [facility] [phone]

## 2020-02-21 ENCOUNTER — Other Ambulatory Visit: Payer: Self-pay

## 2020-02-21 ENCOUNTER — Ambulatory Visit (INDEPENDENT_AMBULATORY_CARE_PROVIDER_SITE_OTHER): Payer: PPO | Admitting: Family Medicine

## 2020-02-21 VITALS — BP 124/68 | HR 80 | Temp 97.2°F | Ht 66.0 in | Wt 152.0 lb

## 2020-02-21 DIAGNOSIS — F331 Major depressive disorder, recurrent, moderate: Secondary | ICD-10-CM | POA: Diagnosis not present

## 2020-02-21 DIAGNOSIS — E782 Mixed hyperlipidemia: Secondary | ICD-10-CM

## 2020-02-21 DIAGNOSIS — R7303 Prediabetes: Secondary | ICD-10-CM | POA: Diagnosis not present

## 2020-02-21 DIAGNOSIS — F411 Generalized anxiety disorder: Secondary | ICD-10-CM

## 2020-02-22 LAB — CBC WITH DIFFERENTIAL/PLATELET
Basophils Absolute: 0 10*3/uL (ref 0.0–0.2)
Basos: 0 %
EOS (ABSOLUTE): 0.1 10*3/uL (ref 0.0–0.4)
Eos: 1 %
Hematocrit: 44.9 % (ref 34.0–46.6)
Hemoglobin: 14.9 g/dL (ref 11.1–15.9)
Immature Grans (Abs): 0 10*3/uL (ref 0.0–0.1)
Immature Granulocytes: 0 %
Lymphocytes Absolute: 2.1 10*3/uL (ref 0.7–3.1)
Lymphs: 32 %
MCH: 29.7 pg (ref 26.6–33.0)
MCHC: 33.2 g/dL (ref 31.5–35.7)
MCV: 89 fL (ref 79–97)
Monocytes Absolute: 0.4 10*3/uL (ref 0.1–0.9)
Monocytes: 6 %
Neutrophils Absolute: 4 10*3/uL (ref 1.4–7.0)
Neutrophils: 61 %
Platelets: 318 10*3/uL (ref 150–450)
RBC: 5.02 x10E6/uL (ref 3.77–5.28)
RDW: 13.4 % (ref 11.7–15.4)
WBC: 6.7 10*3/uL (ref 3.4–10.8)

## 2020-02-22 LAB — HEMOGLOBIN A1C
Est. average glucose Bld gHb Est-mCnc: 128 mg/dL
Hgb A1c MFr Bld: 6.1 % — ABNORMAL HIGH (ref 4.8–5.6)

## 2020-02-22 LAB — COMPREHENSIVE METABOLIC PANEL
ALT: 15 IU/L (ref 0–32)
AST: 21 IU/L (ref 0–40)
Albumin/Globulin Ratio: 2 (ref 1.2–2.2)
Albumin: 4.7 g/dL (ref 3.7–4.7)
Alkaline Phosphatase: 79 IU/L (ref 44–121)
BUN/Creatinine Ratio: 20 (ref 12–28)
BUN: 11 mg/dL (ref 8–27)
Bilirubin Total: 0.5 mg/dL (ref 0.0–1.2)
CO2: 22 mmol/L (ref 20–29)
Calcium: 9.5 mg/dL (ref 8.7–10.3)
Chloride: 101 mmol/L (ref 96–106)
Creatinine, Ser: 0.55 mg/dL — ABNORMAL LOW (ref 0.57–1.00)
GFR calc Af Amer: 109 mL/min/{1.73_m2} (ref >59)
GFR calc non Af Amer: 95 mL/min/{1.73_m2} (ref >59)
Globulin, Total: 2.4 g/dL (ref 1.5–4.5)
Glucose: 102 mg/dL — ABNORMAL HIGH (ref 65–99)
Potassium: 4.8 mmol/L (ref 3.5–5.2)
Sodium: 139 mmol/L (ref 134–144)
Total Protein: 7.1 g/dL (ref 6.0–8.5)

## 2020-02-22 LAB — LIPID PANEL
Chol/HDL Ratio: 3 ratio (ref 0.0–4.4)
Cholesterol, Total: 167 mg/dL (ref 100–199)
HDL: 55 mg/dL (ref >39)
LDL Chol Calc (NIH): 81 mg/dL (ref 0–99)
Triglycerides: 181 mg/dL — ABNORMAL HIGH (ref 0–149)
VLDL Cholesterol Cal: 31 mg/dL (ref 5–40)

## 2020-02-22 LAB — CARDIOVASCULAR RISK ASSESSMENT

## 2020-02-24 ENCOUNTER — Encounter: Payer: Self-pay | Admitting: Family Medicine

## 2020-03-18 ENCOUNTER — Telehealth: Payer: Self-pay | Admitting: Family Medicine

## 2020-03-18 NOTE — Progress Notes (Signed)
  Chronic Care Management   Note  03/18/2020 Name: Virginia Montoya MRN: [id number] DOB: Jun 07, 1950  Virginia Montoya is a 70 y.o. year old female who is a primary care patient of Cox, Kirsten, MD. I reached out to [name] by phone today in response to a referral sent by [name]'s PCP, Cox, Kirsten, MD.   [name] was given information about Chronic Care Management services today including:  1. CCM service includes personalized support from designated clinical staff supervised by her physician, including individualized plan of care and coordination with other care providers 2. 24/7 contact phone numbers for assistance for urgent and routine care needs. 3. Service will only be billed when office clinical staff spend 20 minutes or more in a month to coordinate care. 4. Only one practitioner may furnish and bill the service in a calendar month. 5. The patient may stop CCM services at any time (effective at the end of the month) by phone call to the office staff.   Patient agreed to services and verbal consent obtained.   Follow up plan:   Carley Perdue [employer] Scheduler

## 2020-03-19 ENCOUNTER — Telehealth: Payer: Self-pay

## 2020-03-19 NOTE — Telephone Encounter (Signed)
      Reached out to patient today to remind her that she is due for a Cologuard.  She would like to hold off for now as she is helping care for her daughter with cancer.  She will call when she is ready to order.  Creola Corn, LPN 99/77/41 4:23 PM

## 2020-03-20 ENCOUNTER — Other Ambulatory Visit: Payer: Self-pay | Admitting: Family Medicine

## 2020-04-04 ENCOUNTER — Other Ambulatory Visit: Payer: Self-pay | Admitting: Family Medicine

## 2020-05-11 ENCOUNTER — Other Ambulatory Visit: Payer: Self-pay | Admitting: Family Medicine

## 2020-05-12 ENCOUNTER — Telehealth: Payer: Self-pay

## 2020-05-12 NOTE — Chronic Care Management (AMB) (Signed)
Chronic Care Management Pharmacy Assistant   Name: JINI HORIUCHI  MRN: [id number] DOB: [birth year]  Virginia Montoya is an 70 y.o. year old female who presents for his initial CCM visit with the clinical pharmacist.  Reason for Encounter: Initial questions call     Recent office visits:  02/21/2020: Blane Ohara, MD (PCP) / Change in therapy, venlafaxine 37.5 to 75 mg. Take 1 tablet po qd. / Completed course of Ibu. And Doxycycline.  12/03/2019: Blane Ohara, ME (PCP)/  Referral to Orthopaedic Sx/ Change Venlafaxine from 75 mg. to 37.5 mg.   11/19/2019: Dorette Grate, MD (PCP) / Sinusitis/ Added Doxy Hyclate 100 mg. po bid  Recent consult visits:  02/15/2020: Rockney Ghee. Dionne Bucy, MD (Ophthalmology) / Discuss (R) ey cataract laser sx  01/15/2020: Garnette Gunner, MD (Orthopaedics) / Wedge compression fx T11-T12/ No medication changes noted   01/08/2020: Rockney Ghee. Dionne Bucy, MD (Ophthalmology) / Discuss (L) eye cataract laser   12/04/2020: Ane Payment, MD ( Optometry) / Keratoconjunctivitis sicca/  No medication changes noted  Hospital visits:  No hospital visits noted within the past 6 months    Medications: Outpatient Encounter Medications as of 05/12/2020  Medication Sig  . acetaminophen (TYLENOL) 500 MG tablet Take 500 mg every 6 (six) hours as needed by mouth.  . D-Mannose 500 MG CAPS Take 500 mg by mouth daily.  . diclofenac (VOLTAREN) 75 MG EC tablet Take 75 mg by mouth 2 (two) times daily.  Marland Kitchen LORazepam (ATIVAN) 0.5 MG tablet TAKE 1 TABLET(0.5 MG) BY MOUTH EVERY 8 HOURS AS NEEDED FOR ANXIETY  . omeprazole (PRILOSEC) 20 MG capsule Take 1 capsule (20 mg total) by mouth daily.  Marland Kitchen pyridOXINE (VITAMIN B-6) 100 MG tablet Take 100 mg by mouth daily.  . rosuvastatin (CRESTOR) 20 MG tablet TAKE 1 TABLET(20 MG) BY MOUTH EVERY DAY  . venlafaxine XR (EFFEXOR-XR) 75 MG 24 hr capsule TAKE 1 CAPSULE(75 MG) BY MOUTH DAILY WITH BREAKFAST  . vitamin B-12 (CYANOCOBALAMIN) 1000 MCG tablet Take 1,000 mcg  by mouth daily.  Marland Kitchen zolpidem (AMBIEN) 10 MG tablet TAKE 1 TABLET BY MOUTH AT BEDTIME   No facility-administered encounter medications on file as of 05/12/2020.     Lab Results  Component Value Date/Time   HGBA1C 6.1 (H) 02/21/2020 11:37 AM   HGBA1C 6.2 (H) 10/08/2019 08:58 AM     BP Readings from Last 3 Encounters:  02/21/20 124/68  10/08/19 140/70  08/29/19 (!) 150/80      . Have you seen any other providers since your last visit with PCP? No  . Any changes in your medications or health? No  . Any side effects from any medications? Yes patient stated that she would like to try Prolia injections and asked if patient assistance would be available. The po Prolia was causing GI upset.  . Do you have an symptoms or problems not managed by your medications?        Patient stated she is not having any problems with medications.  . Any concerns about your health right now?        Patient stated that she does not have any concerns about her health at this       time.   . Do you get any type of exercise on a regular basis?         Patient stated that it is difficult to exercise on a regular basis as she is very  busy taking care of her daughter who has cancer.   . Can you think of a goal you would like to reach for your health?        Patient stated not at this time.   . Do you have any problems getting your medications? No o Patient's preferred pharmacy is:  [id number] - [place] - [address] AT [facility] [address] [place] [address] Phone: [phone] Fax: [phone]   . Is there anything that you would like to discuss during the appointment?        Patient reported that she would like to discuss Prolia injections.    Virginia Montoya was reminded to have all medications, supplements and any blood glucose and blood pressure readings available for review with Huntley Dec B. Clayborne Dana. D, at her televisit on Tuesday,  05/20/2020 @ 10:30 AM .    Star Rating Drugs:  Medication:  Last Fill: Day Supply Rosuvastin   04/30/2020 90DS   Josiah Lobo, CMA  [phone] Clinical Pharmacist Assistant

## 2020-05-15 NOTE — Progress Notes (Signed)
Chronic Care Management Pharmacy Note  05/20/2020 Name:  Virginia Montoya MRN:  [id number] DOB:  [birth year]   Plan Updates:   Patient would like to begin Prolia but cost is prohibitive. Pharmacist is working to find option to improve affordability. [employer] safety net program is not an option due to medicare coverage. [place] and [employer] do not have funds at this time.   Subjective: Virginia Montoya is an 70 y.o. year old female who is a primary patient of Cox, Kirsten, MD.  The CCM team was consulted for assistance with disease management and care coordination needs.    Engaged with patient by telephone for initial visit in response to provider referral for pharmacy case management and/or care coordination services.   Consent to Services:  The patient was given the following information about Chronic Care Management services today, agreed to services, and gave verbal consent: 1. CCM service includes personalized support from designated clinical staff supervised by the primary care provider, including individualized plan of care and coordination with other care providers 2. 24/7 contact phone numbers for assistance for urgent and routine care needs. 3. Service will only be billed when office clinical staff spend 20 minutes or more in a month to coordinate care. 4. Only one practitioner may furnish and bill the service in a calendar month. 5.The patient may stop CCM services at any time (effective at the end of the month) by phone call to the office staff. 6. The patient will be responsible for cost sharing (co-pay) of up to 20% of the service fee (after annual deductible is met). Patient agreed to services and consent obtained.  Patient Care Team: Rochel Brome, MD as PCP - General (Family Medicine) Leonie Man, MD as PCP - Cardiology (Cardiology) Burnice Logan, [facility] as Pharmacist (Pharmacist)  Recent office visits:  02/21/2020: Rochel Brome, MD (PCP) / Change in therapy,  venlafaxine 37.5 to 75 mg. Take 1 tablet po qd. / Completed course of Ibu. And Doxycycline.  12/03/2019: Rochel Brome, ME (PCP)/  Referral to Orthopaedic Sx/ Change Venlafaxine from 75 mg. to 37.5 mg.   11/19/2019: Benny Lennert, MD (PCP) / Sinusitis/ Added Doxy Hyclate 100 mg. po bid  Recent consult visits:  02/15/2020: Michiel Sites. Lysle Morales, MD (Ophthalmology) / Discuss (R) ey cataract laser sx  01/15/2020: Creig Hines, MD (Orthopaedics) / Wedge compression fx T11-T12/ No medication changes noted   01/08/2020: Michiel Sites. Lysle Morales, MD (Ophthalmology) / Discuss (L) eye cataract laser   12/04/2020: Traci Sermon, MD ( Optometry) / Keratoconjunctivitis sicca/  No medication changes noted  Hospital visits:  No hospital visits noted within the past 6 months  Objective:  Lab Results  Component Value Date   CREATININE 0.55 (L) 02/21/2020   BUN 11 02/21/2020   GFRNONAA 95 02/21/2020   GFRAA 109 02/21/2020   NA 139 02/21/2020   K 4.8 02/21/2020   CALCIUM 9.5 02/21/2020   CO2 22 02/21/2020   GLUCOSE 102 (H) 02/21/2020    Lab Results  Component Value Date/Time   HGBA1C 6.1 (H) 02/21/2020 11:37 AM   HGBA1C 6.2 (H) 10/08/2019 08:58 AM    Last diabetic Eye exam: No results found for: HMDIABEYEEXA  Last diabetic Foot exam: No results found for: HMDIABFOOTEX   Lab Results  Component Value Date   CHOL 167 02/21/2020   HDL 55 02/21/2020   LDLCALC 81 02/21/2020   TRIG 181 (H) 02/21/2020   CHOLHDL 3.0 02/21/2020    Hepatic Function Latest Ref Rng &  Units 02/21/2020 10/08/2019 05/02/2019  Total Protein 6.0 - 8.5 g/dL 7.1 6.6 6.5  Albumin 3.7 - 4.7 g/dL 4.7 4.6 4.4  AST 0 - 40 IU/L $Remov'21 18 16  'JHEBdu$ ALT 0 - 32 IU/L $Remov'15 18 17  'JlMKzr$ Alk Phosphatase 44 - 121 IU/L 79 74 113  Total Bilirubin 0.0 - 1.2 mg/dL 0.5 0.4 0.5    Lab Results  Component Value Date/Time   TSH 2.510 03/06/2019 01:34 PM    CBC Latest Ref Rng & Units 02/21/2020 10/08/2019 05/02/2019  WBC 3.4 - 10.8 x10E3/uL 6.7 6.6 6.1  Hemoglobin  11.1 - 15.9 g/dL 14.9 14.8 14.1  Hematocrit 34.0 - 46.6 % 44.9 44.9 43.3  Platelets 150 - 450 x10E3/uL 318 359 320    Lab Results  Component Value Date/Time   VD25OH 57.8 10/08/2019 08:59 AM    Clinical ASCVD: No  The ASCVD Risk score (Wheeling., et al., 2013) failed to calculate for the following reasons:   Unable to determine if patient is Non-Hispanic African American    Depression screen [facility] 2/9 02/21/2020 12/03/2019 12/03/2019  Decreased Interest 0 0 0  Down, Depressed, Hopeless 0 0 0  PHQ - 2 Score 0 0 0  Altered sleeping - - -  Tired, decreased energy - - -  Change in appetite - - -  Feeling bad or failure about yourself  - - -  Trouble concentrating - - -  Moving slowly or fidgety/restless - - -  Suicidal thoughts - - -  PHQ-9 Score - - -  Difficult doing work/chores - - -     Social History   Tobacco Use  Smoking Status Never Smoker  Smokeless Tobacco Never Used   BP Readings from Last 3 Encounters:  02/21/20 124/68  10/08/19 140/70  08/29/19 (!) 150/80   Pulse Readings from Last 3 Encounters:  02/21/20 80  12/03/19 92  10/08/19 100   Wt Readings from Last 3 Encounters:  02/21/20 152 lb (68.9 kg)  12/03/19 154 lb (69.9 kg)  10/08/19 149 lb 6.4 oz (67.8 kg)   BMI Readings from Last 3 Encounters:  02/21/20 24.53 kg/m  12/03/19 24.86 kg/m  10/08/19 24.11 kg/m    Assessment/Interventions: Review of patient past medical history, allergies, medications, health status, including review of consultants reports, laboratory and other test data, was performed as part of comprehensive evaluation and provision of chronic care management services.   SDOH:  (Social Determinants of Health) assessments and interventions performed: Yes  SDOH Screenings   Alcohol Screen: Not on file  Depression (PHQ2-9): Low Risk   . PHQ-2 Score: 0  Financial Resource Strain: Not on file  Food Insecurity: No Food Insecurity  . Worried About Charity fundraiser in the Last  Year: Never true  . Ran Out of Food in the Last Year: Never true  Housing: Low Risk   . Last Housing Risk Score: 0  Physical Activity: Not on file  Social Connections: Not on file  Stress: Not on file  Tobacco Use: Low Risk   . Smoking Tobacco Use: Never Smoker  . Smokeless Tobacco Use: Never Used  Transportation Needs: No Transportation Needs  . Lack of Transportation (Medical): No  . Lack of Transportation (Non-Medical): No    CCM Care Plan  Allergies  Allergen Reactions  . Cephalexin Nausea And Vomiting    ask  . Alendronate     Gi upset  . Biaxin [Clarithromycin] Other (See Comments)    CHEST PAIN  .  Sulfa Antibiotics     Made her feel poorly.     Medications Reviewed Today    Reviewed by Burnice Logan, [facility] (Pharmacist) on 05/20/20 at 1100  Med List Status: <None>  Medication Order Taking? Sig Documenting Provider Last Dose Status Informant  acetaminophen (TYLENOL) 500 MG tablet [id number] Yes Take 500 mg every 6 (six) hours as needed by mouth. [provider] Taking Active   D-Mannose 500 MG CAPS [id number] Yes Take 500 mg by mouth daily. [provider] Taking Active   diclofenac (VOLTAREN) 75 MG EC tablet [id number] No Take 75 mg by mouth 2 (two) times daily.  Patient not taking: Reported on 05/20/2020   [provider] Not Taking Consider Medication Status and Discontinue   LORazepam (ATIVAN) 0.5 MG tablet [id number] Yes TAKE 1 TABLET(0.5 MG) BY MOUTH EVERY 8 HOURS AS NEEDED FOR ANXIETY Cox, Kirsten, MD Taking Active   omeprazole (PRILOSEC) 20 MG capsule [id number] Yes Take 1 capsule (20 mg total) by mouth daily. Cox, Kirsten, MD Taking Active   pyridOXINE (VITAMIN B-6) 100 MG tablet [id number] Yes Take 100 mg by mouth daily. [provider] Taking Active   rosuvastatin (CRESTOR) 20 MG tablet [id number] Yes TAKE 1 TABLET(20 MG) BY MOUTH EVERY DAY Corum, Rex Kras, MD Taking Active   venlafaxine XR (EFFEXOR-XR) 75 MG 24 hr capsule [id number]  Yes TAKE 1 CAPSULE(75 MG) BY MOUTH DAILY WITH BREAKFAST Cox, Kirsten, MD Taking Active   vitamin B-12 (CYANOCOBALAMIN) 1000 MCG tablet [id number] Yes Take 1,000 mcg by mouth daily. [provider] Taking Active   zolpidem (AMBIEN) 10 MG tablet [id number] Yes TAKE 1 TABLET BY MOUTH AT BEDTIME Cox, Kirsten, MD Taking Active           Patient Active Problem List   Diagnosis Date Noted  . Sinusitis 11/19/2019  . Spinal stenosis of lumbar region with neurogenic claudication 06/04/2019  . GAD (generalized anxiety disorder) 06/04/2019  . Panic attacks 06/04/2019  . Prediabetes 05/06/2019  . Spinal stenosis of lumbosacral region 05/06/2019  . Moderate recurrent major depression ([place]) 05/06/2019  . Acute midline low back pain with left-sided sciatica 05/06/2019  . Acute bilateral low back pain without sciatica 03/27/2019  . Age-related osteoporosis without current pathological fracture 03/18/2019  . Other fatigue 03/06/2019  . Acute cystitis without hematuria 03/06/2019  . Idiopathic peripheral neuropathy 05/23/2018  . Contracture of tendon sheath 01/17/2018  . Costochondritis 02/10/2017  . Precordial chest pain 11/17/2016  . Mixed hyperlipidemia 11/17/2016    Immunization History  Administered Date(s) Administered  . Fluad Quad(high Dose 65+) 10/08/2019  . Influenza, High Dose Seasonal PF 09/06/2014, 11/14/2015  . Influenza,inj,Quad PF,6+ Mos 10/02/2013, 10/12/2016, 10/21/2016  . Influenza-Unspecified 11/02/2010, 11/01/2011, 09/19/2012, 10/11/2017  . PFIZER(Purple Top)SARS-COV-2 Vaccination 05/01/2019, 05/24/2019, 12/06/2019  . Pneumococcal Polysaccharide-23 11/14/2015, 10/12/2016  . Td 10/23/2016  . Zoster 11/10/2013  . Zoster Recombinat (Shingrix) 11/02/2016    Conditions to be addressed/monitored:  Hyperlipidemia, Depression, Anxiety, Osteoporosis and neuropathy, prediabetes  Care Plan : CCM Pharmacy Care Plan  Updates made by Burnice Logan, [place] since 05/20/2020 12:00  AM    Problem: anxiety, hld, prediabetes, osteoporosis, GERD   Priority: High  Onset Date: 05/20/2020    Long-Range Goal: Disease State Management   Start Date: 05/20/2020  Expected End Date: 05/20/2021  This Visit's Progress: On track  Priority: High  Note:    Current Barriers:  . Unable to independently afford treatment regimen  Pharmacist Clinical Goal(s):  Marland Kitchen Patient will verbalize ability  to afford treatment regimen through collaboration with PharmD and provider.   Interventions: . 1:1 collaboration with Rochel Brome, MD regarding development and update of comprehensive plan of care as evidenced by provider attestation and co-signature . Inter-disciplinary care team collaboration (see longitudinal plan of care) . Comprehensive medication review performed; medication list updated in electronic medical record  Hyperlipidemia: (LDL goal < 100) -Controlled -Current treatment: . Rosuvastatin 20 mg daily -Medications previously tried: none reported  -Current dietary patterns: healthy diet with vegetables and fruit -Current exercise habits: stays active gardening, house work and walking  -Educated on Cholesterol goals;  Benefits of statin for ASCVD risk reduction; Importance of limiting foods high in cholesterol; Exercise goal of 150 minutes per week; -Counseled on diet and exercise extensively Recommended to continue current medication  Prediabetes (A1c goal <6.5%) -Controlled -Current medications: . Diet/lifestyle  -Medications previously tried: none reported -Current home glucose readings . fasting glucose: not checking  . post prandial glucose: not checking  -Denies hypoglycemic/hyperglycemic symptoms -Current meal patterns:  . breakfast:  smoothies with boost and yogurt, . lunch:   vegetable soups, pizza, garden vegetables . dinner: avoids sweets, pintos and cornbread, avoids fried foods . snacks:  eats cheese for a snack, orange  . drinks: does not drink  milk -Current exercise: stays active in her yard and walking  -Educated on Exercise goal of 150 minutes per week; Carbohydrate counting and/or plate method -Counseled to check feet daily and get yearly eye exams -Counseled on diet and exercise extensively  Depression/Anxiety (Goal: manage symptoms of anxiety) -Controlled -Current treatment: . lorazepam 0.5 mg every 8 hours prn anxiety  . Venlafaxine xr 75 mg daily with breakfast -Medications previously tried/failed: none reported -PHQ9: 0 -GAD7: 4 -Educated on Benefits of medication for symptom control -Recommended to continue current medication Counseled on prayer and meditation.   Osteoporosis / Osteopenia (Goal improve bone health) -Not ideally controlled -Last DEXA Scan: 02/26/2019   T-Score femoral neck: -2.8  T-Score lumbar spine: -2.9 -Patient is a candidate for pharmacologic treatment due to T-Score < -2.5 in femoral neck and T-Score < -2.5 in lumbar spine -Current treatment  . Vitamin D 1000 units twice daily  -Medications previously tried:  Alendronate (GI upset), calcium (constipation) -Recommend [phone] units of vitamin D daily. Recommend 1200 mg of calcium daily from dietary and supplemental sources. Recommend weight-bearing and muscle strengthening exercises for building and maintaining bone density. -Counseled on diet and exercise extensively Assessed patient finances. Working on Hospital doctor affordability.   GERD (Goal: manage symptoms of indigestions) -Controlled -Current treatment  . omeprazole 20 mg daily  -Medications previously tried: none reported  -Counseled on diet and exercise extensively. Avoids trigger foods and notices increase in symptoms when bending over during yard work.   Chronic Insomnia (Goal: improve sleep) -Controlled -Current treatment  . zolpidem 10 mg at bedtime - typically takes 1/2 tablet  -Medications previously tried: none reported  -Counseled on diet and exercise  extensively Counseled on benefit of prayer/meditation and good sleep hygeine.   Health Maintenance -Vaccine gaps: fourth COVID -Current therapy:  . d-mannose 500 mg daily  . Vitamin b-6 daily  . Vitamin b-12 1000 mcg daily  . acetaminophen 500 mg every 6 hours prn  -Educated on Cost vs benefit of each product must be carefully weighed by individual consumer -Patient is satisfied with current therapy and denies issues -Recommended to continue current medication   Patient Goals/Self-Care Activities . Patient will:  - take medications as prescribed target a minimum  of 150 minutes of moderate intensity exercise weekly engage in dietary modifications by limiting fried/fatty foods and sugar.   Follow Up Plan: Telephone follow up appointment with care management team member scheduled for: 05/2021      Medication Assistance: Application for Prolia  medication assistance program. Program responded patient is not eligible since participates in part B and D plan. Pharmacist working on alternative options.   Patient's preferred pharmacy is:  [id number], [place] - [address] AT [address] [place] [address] Phone: [phone] Fax: [phone]  Uses pill box? No - has one but didn't have enough spaces so hasn't used it. Takes medications in a routine and doesn't miss doses.  Pt endorses good compliance  We discussed: Benefits of medication synchronization, packaging and delivery as well as enhanced pharmacist oversight with [employer]. Patient decided to: Continue current medication management strategy  Care Plan and Follow Up Patient Decision:  Patient agrees to Care Plan and Follow-up.  Plan: Telephone follow up appointment with care management team member scheduled for:  05/2021

## 2020-05-19 ENCOUNTER — Telehealth: Payer: Self-pay

## 2020-05-20 ENCOUNTER — Other Ambulatory Visit: Payer: Self-pay

## 2020-05-20 ENCOUNTER — Ambulatory Visit (INDEPENDENT_AMBULATORY_CARE_PROVIDER_SITE_OTHER): Payer: PPO

## 2020-05-20 DIAGNOSIS — M810 Age-related osteoporosis without current pathological fracture: Secondary | ICD-10-CM

## 2020-05-20 DIAGNOSIS — F331 Major depressive disorder, recurrent, moderate: Secondary | ICD-10-CM | POA: Diagnosis not present

## 2020-05-20 DIAGNOSIS — F411 Generalized anxiety disorder: Secondary | ICD-10-CM

## 2020-05-20 DIAGNOSIS — E782 Mixed hyperlipidemia: Secondary | ICD-10-CM

## 2020-05-20 DIAGNOSIS — R7303 Prediabetes: Secondary | ICD-10-CM

## 2020-05-20 NOTE — Patient Instructions (Addendum)
Visit Information  Thank you for your time discussing your medications. I look forward to working with you to achieve your health care goals. Below is a summary of what we talked about during our visit.   Goals Addressed            This Visit's Progress   . Learn More About My Health       Timeframe:  Long-Range Goal Priority:  High Start Date:                             Expected End Date:                        Follow Up Date 05/2021    - tell my story and reason for my visit - make a list of questions - ask questions - repeat what I heard to make sure I understand - bring a list of my medicines to the visit - speak up when I don't understand    Why is this important?    The best way to learn about your health and care is by talking to the doctor and nurse.   They will answer your questions and give you information in the way that you like best.    Notes:     Marland Kitchen. Manage My Medicine       Timeframe:  Long-Range Goal Priority:  High Start Date:                             Expected End Date:                       Follow Up Date 05/2021    - call for medicine refill 2 or 3 days before it runs out - keep a list of all the medicines I take; vitamins and herbals too    Why is this important?   . These steps will help you keep on track with your medicines.   Notes:     . Prevent Falls and Broken Bones-Osteoporosis       Timeframe:  Long-Range Goal Priority:  High Start Date:                             Expected End Date:                       Follow Up Date 05/2021    - always wear shoes or slippers with non-slip sole - get at least 10 minutes of activity every day    Why is this important?    When you fall, there are 3 things that control if a bone breaks or not.   These are the fall itself, how hard and the direction that you fall and how fragile your bones are.   Preventing falls is very important for you because of fragile bones.     Notes:         Patient Care Plan: CCM Pharmacy Care Plan    Problem Identified: anxiety, hld, prediabetes, osteoporosis, GERD   Priority: High  Onset Date: 05/20/2020    Long-Range Goal: Disease State Management   Start Date: 05/20/2020  Expected End Date: 05/20/2021  This Visit's Progress: On track  Priority: High  Note:  Current Barriers:  . Unable to independently afford treatment regimen  Pharmacist Clinical Goal(s):  Marland Kitchen Patient will verbalize ability to afford treatment regimen through collaboration with PharmD and provider.   Interventions: . 1:1 collaboration with Blane Ohara, MD regarding development and update of comprehensive plan of care as evidenced by provider attestation and co-signature . Inter-disciplinary care team collaboration (see longitudinal plan of care) . Comprehensive medication review performed; medication list updated in electronic medical record  Hyperlipidemia: (LDL goal < 100) -Controlled -Current treatment: . Rosuvastatin 20 mg daily -Medications previously tried: none reported  -Current dietary patterns: healthy diet with vegetables and fruit -Current exercise habits: stays active gardening, house work and walking  -Educated on Cholesterol goals;  Benefits of statin for ASCVD risk reduction; Importance of limiting foods high in cholesterol; Exercise goal of 150 minutes per week; -Counseled on diet and exercise extensively Recommended to continue current medication  Prediabetes (A1c goal <6.5%) -Controlled -Current medications: . Diet/lifestyle  -Medications previously tried: none reported -Current home glucose readings . fasting glucose: not checking  . post prandial glucose: not checking  -Denies hypoglycemic/hyperglycemic symptoms -Current meal patterns:  . breakfast:  smoothies with boost and yogurt, . lunch:   vegetable soups, pizza, garden vegetables . dinner: avoids sweets, pintos and cornbread, avoids fried foods . snacks:  eats cheese  for a snack, orange  . drinks: does not drink milk -Current exercise: stays active in her yard and walking  -Educated on Exercise goal of 150 minutes per week; Carbohydrate counting and/or plate method -Counseled to check feet daily and get yearly eye exams -Counseled on diet and exercise extensively  Depression/Anxiety (Goal: manage symptoms of anxiety) -Controlled -Current treatment: . lorazepam 0.5 mg every 8 hours prn anxiety  . Venlafaxine xr 75 mg daily with breakfast -Medications previously tried/failed: none reported -PHQ9: 0 -GAD7: 4 -Educated on Benefits of medication for symptom control -Recommended to continue current medication Counseled on prayer and meditation.   Osteoporosis / Osteopenia (Goal improve bone health) -Not ideally controlled -Last DEXA Scan: 02/26/2019   T-Score femoral neck: -2.8  T-Score lumbar spine: -2.9 -Patient is a candidate for pharmacologic treatment due to T-Score < -2.5 in femoral neck and T-Score < -2.5 in lumbar spine -Current treatment  . Vitamin D 1000 units twice daily  -Medications previously tried:  Alendronate (GI upset), calcium (constipation) -Recommend [phone] units of vitamin D daily. Recommend 1200 mg of calcium daily from dietary and supplemental sources. Recommend weight-bearing and muscle strengthening exercises for building and maintaining bone density. -Counseled on diet and exercise extensively Assessed patient finances. Working on Chartered loss adjuster affordability.   GERD (Goal: manage symptoms of indigestions) -Controlled -Current treatment  . omeprazole 20 mg daily  -Medications previously tried: none reported  -Counseled on diet and exercise extensively. Avoids trigger foods and notices increase in symptoms when bending over during yard work.   Chronic Insomnia (Goal: improve sleep) -Controlled -Current treatment  . zolpidem 10 mg at bedtime - typically takes 1/2 tablet  -Medications previously tried: none reported   -Counseled on diet and exercise extensively Counseled on benefit of prayer/meditation and good sleep hygeine.   Health Maintenance -Vaccine gaps: fourth COVID -Current therapy:  . d-mannose 500 mg daily  . Vitamin b-6 daily  . Vitamin b-12 1000 mcg daily  . acetaminophen 500 mg every 6 hours prn  -Educated on Cost vs benefit of each product must be carefully weighed by individual consumer -Patient is satisfied with current therapy and denies issues -Recommended to  continue current medication   Patient Goals/Self-Care Activities . Patient will:  - take medications as prescribed target a minimum of 150 minutes of moderate intensity exercise weekly engage in dietary modifications by limiting fried/fatty foods and sugar.   Follow Up Plan: Telephone follow up appointment with care management team member scheduled for: 05/2021      Virginia Montoya was given information about Chronic Care Management services today including:  1. CCM service includes personalized support from designated clinical staff supervised by her physician, including individualized plan of care and coordination with other care providers 2. 24/7 contact phone numbers for assistance for urgent and routine care needs. 3. Standard insurance, coinsurance, copays and deductibles apply for chronic care management only during months in which we provide at least 20 minutes of these services. Most insurances cover these services at 100%, however patients may be responsible for any copay, coinsurance and/or deductible if applicable. This service may help you avoid the need for more expensive face-to-face services. 4. Only one practitioner may furnish and bill the service in a calendar month. 5. The patient may stop CCM services at any time (effective at the end of the month) by phone call to the office staff.  Patient agreed to services and verbal consent obtained.   The patient verbalized understanding of instructions,  educational materials, and care plan provided today and declined offer to receive copy of patient instructions, educational materials, and care plan.  Telephone follow up appointment with pharmacy team member scheduled for:  Juliane Lack, PharmD Clinical Pharmacist [facility] [phone] (office) [phone] (mobile)  Eating Plan for Osteoporosis Osteoporosis causes your bones to become weak and brittle. This puts you at greater risk for bone breaks (fractures) from small bumps or falls. Making changes to your diet and increasing your physical activity can help strengthen your bones and improve your overall health. Calcium and vitamin D are nutrients that play an important role in bone health. Vitamin D helps your body use calcium and strengthen bones. It is important to get enough calcium and vitamin D as part of your eating plan for osteoporosis. What are tips for following this plan? Reading food labels  Try to get at least 1,000 milligrams (mg) of calcium each day.  Look for foods that have at least 50 mg of calcium per serving.  Talk with your health care provider about taking a calcium supplement if you do not get enough calcium from food.  Do not have more than 2,500 mg of calcium each day. This is the upper limit for food and nutritional supplements combined. Too much calcium may cause constipation and prevent you from absorbing other important nutrients.  Choose foods that contain vitamin D.  Take a daily vitamin supplement that contains 800-1,000 international units (IU) of vitamin D. The amount may be different depending on your age, body weight, and where you live. Talk with your dietitian or health care provider about how much vitamin D is right for you.  Avoid foods that have more than 300 mg of sodium per serving. Too much sodium can cause your body to lose calcium.  Talk with your dietitian or health care provider about how much sodium you are allowed each  day. Shopping  Do not buy foods with added salt, including: ? Salted snacks. ? Rosita Fire. ? Canned soups. ? Canned meats. ? Processed meats, such as bacon or precooked or cured meat like sausages or meat loaves. ? Smoked fish. Meal planning  Eat balanced meals  that contain protein foods, fruits and vegetables, and foods rich in calcium and vitamin D.  Eat at least 5 servings of fruits and vegetables each day.  Eat 5-6 oz (142-170 g) of lean meat, poultry, fish, eggs, or beans each day. Lifestyle  Do not use any products that contain nicotine or tobacco, such as cigarettes, e-cigarettes, and chewing tobacco. If you need help quitting, ask your health care provider.  If your health care provider recommends that you lose weight: ? Work with a dietitian to develop an eating plan that will help you reach your desired weight goal. ? Exercise for at least 30 minutes a day, 5 or more days a week, or as told by your health care provider.  Work with a physical therapist to develop an exercise plan that includes flexibility, balance, and strength exercises. Do not focus only on aerobic exercise.  Do not drink alcohol if: ? Your health care provider tells you not to drink. ? You are pregnant, may be pregnant, or are planning to become pregnant.  If you drink alcohol: ? Limit how much you use to:  0-1 drink a day for women.  0-2 drinks a day for men. ? Be aware of how much alcohol is in your drink. In the [place] one drink equals one 12 oz bottle of beer (355 mL), one 5 oz glass of wine (148 mL), or one 1 oz glass of hard liquor (44 mL). What foods should I eat? Foods high in calcium  Yogurt. Yogurt with fruit.  Milk. Evaporated skim milk. Dry milk powder.  Calcium-fortified orange juice.  Parmesan cheese. Part-skim ricotta cheese. Natural hard cheese. Cream cheese. Cottage cheese.  Canned sardines. Canned salmon.  Calcium-treated tofu. Calcium-fortified cereal bar.  Calcium-fortified cereal. Calcium-fortified graham crackers.  Cooked collard greens. Turnip greens. Broccoli. Kale.  Almonds.  White beans.  Corn tortilla.   Foods high in vitamin D  Cod liver oil. Fatty fish, such as tuna, mackerel, and salmon.  Milk. Fortified soy milk. Fortified fruit juice.  Yogurt. Margarine.  Egg yolks. Foods high in protein  Beef. Lamb. Pork tenderloin.  Chicken breast.  Tuna (canned). Fish fillet.  Tofu.  Cooked soy beans. Soy patty. Beans (canned or cooked).  Cottage cheese.  Yogurt.  Peanut butter.  Pumpkin seeds. Nuts. Sunflower seeds.  Hard cheese.  Milk or other milk products, such as soy milk. The items listed above may not be a complete list of foods and beverages you can eat. Contact a dietitian for more options. Summary  Calcium and vitamin D are nutrients that play an important role in bone health and are an important part of your eating plan for osteoporosis.  Eat balanced meals that contain protein foods, fruits and vegetables, and foods rich in calcium and vitamin D.  Avoid foods that have more than 300 mg of sodium per serving. Too much sodium can cause your body to lose calcium.  Exercise is an important part of prevention and treatment of osteoporosis. Aim for at least 30 minutes a day, 5 days a week. This information is not intended to replace advice given to you by your health care provider. Make sure you discuss any questions you have with your health care provider. Document Revised: 06/07/2019 Document Reviewed: 06/07/2019 Elsevier Patient Education  2021 [employer].

## 2020-05-23 NOTE — Progress Notes (Signed)
ENTERED IN ERROR   Meribeth Vitug, CMA  [phone] Clinical Pharmacist Assistant   

## 2020-05-29 ENCOUNTER — Encounter: Payer: Self-pay | Admitting: Family Medicine

## 2020-05-29 ENCOUNTER — Ambulatory Visit (INDEPENDENT_AMBULATORY_CARE_PROVIDER_SITE_OTHER): Payer: PPO | Admitting: Family Medicine

## 2020-05-29 ENCOUNTER — Other Ambulatory Visit: Payer: Self-pay

## 2020-05-29 VITALS — BP 132/70 | HR 84 | Temp 97.4°F | Resp 14 | Ht 66.0 in | Wt 157.0 lb

## 2020-05-29 DIAGNOSIS — J0100 Acute maxillary sinusitis, unspecified: Secondary | ICD-10-CM

## 2020-05-29 DIAGNOSIS — E782 Mixed hyperlipidemia: Secondary | ICD-10-CM

## 2020-05-29 DIAGNOSIS — F331 Major depressive disorder, recurrent, moderate: Secondary | ICD-10-CM

## 2020-05-29 DIAGNOSIS — F411 Generalized anxiety disorder: Secondary | ICD-10-CM

## 2020-05-29 DIAGNOSIS — R7303 Prediabetes: Secondary | ICD-10-CM | POA: Diagnosis not present

## 2020-05-29 LAB — POCT UA - MICROALBUMIN: Microalbumin Ur, POC: 10 mg/L

## 2020-05-29 MED ORDER — FLUTICASONE PROPIONATE 50 MCG/ACT NA SUSP: 2.0000 | Freq: Every day | NASAL | 5 refills | Status: DC

## 2020-05-29 MED ORDER — AZITHROMYCIN 250 MG PO TABS: ORAL_TABLET | ORAL | 0 refills | Status: DC

## 2020-05-29 NOTE — Progress Notes (Signed)
Subjective:  Patient ID: Virginia Montoya, female    DOB: 04/09/1950  Age: 70 y.o. MRN: [id number]  Chief Complaint  Patient presents with   Hyperlipidemia    HPI Patient is a 70 year old white female who presents for follow-up of hyperlipidemia, prediabetes, generalized anxiety, and depression.  Patient is eating healthy.  She is exercising.  Her medications (Effexor 75 mg once daily and lorazepam 0.5 mg 3 times daily as needed) are working well for her depression and anxiety.  She is currently on rosuvastatin 20 mg once daily for cholesterol. For GERD she takes omeprazole 20 mg once daily. Insomnia taking Ambien.  This works well. Patient is complaining of some sinus symptoms today.  See below.  Current Outpatient Medications on File Prior to Visit  Medication Sig Dispense Refill   acetaminophen (TYLENOL) 500 MG tablet Take 500 mg every 6 (six) hours as needed by mouth.     cholecalciferol (VITAMIN D3) 25 MCG (1000 UNIT) tablet Take 1,000 Units by mouth 2 (two) times daily.     D-Mannose 500 MG CAPS Take 500 mg by mouth daily.     LORazepam (ATIVAN) 0.5 MG tablet TAKE 1 TABLET(0.5 MG) BY MOUTH EVERY 8 HOURS AS NEEDED FOR ANXIETY 90 tablet 2   Multiple Vitamins-Minerals (PRESERVISION AREDS 2 PO) Take by mouth.     omeprazole (PRILOSEC) 20 MG capsule Take 1 capsule (20 mg total) by mouth daily. 90 capsule 3   pyridOXINE (VITAMIN B-6) 100 MG tablet Take 100 mg by mouth daily.     rosuvastatin (CRESTOR) 20 MG tablet TAKE 1 TABLET(20 MG) BY MOUTH EVERY DAY 90 tablet 1   venlafaxine XR (EFFEXOR-XR) 75 MG 24 hr capsule TAKE 1 CAPSULE(75 MG) BY MOUTH DAILY WITH BREAKFAST 90 capsule 1   vitamin B-12 (CYANOCOBALAMIN) 1000 MCG tablet Take 1,000 mcg by mouth daily.     zolpidem (AMBIEN) 10 MG tablet TAKE 1 TABLET BY MOUTH AT BEDTIME 30 tablet 3   diclofenac (VOLTAREN) 75 MG EC tablet Take 75 mg by mouth 2 (two) times daily. (Patient not taking: No sig reported)     No current facility-administered  medications on file prior to visit.   Past Medical History:  Diagnosis Date   Chronic insomnia    Depression    Gastroesophageal reflux disease    Hyperlipidemia    Osteoporosis    Prediabetes    Past Surgical History:  Procedure Laterality Date   CHOLECYSTECTOMY  1999   CORONARY CALCIUM SCORE/CORONARY CTA  01/2017   Coronary calcium score is 0.  Normal coronary origin with normal right dominance.  No evidence of CAD.  Most likely noncardiac chest pain.   SHOULDER SURGERY  2005   TONSILLECTOMY     TUBAL LIGATION  1977    Family History  Problem Relation Age of Onset   Diabetes Mother    Hyperlipidemia Mother    Emphysema Father    Diabetes Maternal Grandmother    Liver cancer Maternal Grandmother    Social History   Socioeconomic History   Marital status: Married    Spouse name: Not on file   Number of children: 3   Years of education: 12   Highest education level: Associate degree: academic program  Occupational History   Occupation: Retired  Tobacco Use   Smoking status: Never   Smokeless tobacco: Never  Substance and Sexual Activity   Alcohol use: No   Drug use: No   Sexual activity: Not Currently  Other Topics Concern  Not on file  Social History Narrative   She is a married (remarried) mother of 3 with 4 grandchildren.  She has 3 great-grandchildren.  She previously worked at [employer] in the [employer].  She completed high school and beauty school.   She works outside a lot in the yard on gardening.  She does a lot of walking and exercises with walking for at least a half an hour a day 5 days a week.   wears sunscreen, brushes and flosses daily, see's dentist bi-annually, has smoke/carbon monoxide detectors, wears a seatbelt and practices gun safety   Social Determinants of Health   Financial Resource Strain: Not on file  Food Insecurity: No Food Insecurity   Worried About Programme researcher, broadcasting/film/video in the Last Year: Never true   Ran Out of Food in the Last  Year: Never true  Transportation Needs: No Transportation Needs   Lack of Transportation (Medical): No   Lack of Transportation (Non-Medical): No  Physical Activity: Not on file  Stress: Not on file  Social Connections: Not on file    Review of Systems  Constitutional:  Positive for chills. Negative for fatigue and fever.  HENT:  Positive for ear pain and sinus pain. Negative for congestion, rhinorrhea and sore throat.   Respiratory:  Negative for cough and shortness of breath.   Cardiovascular:  Negative for chest pain.  Gastrointestinal:  Negative for abdominal pain, constipation, diarrhea, nausea and vomiting.  Endocrine: Negative for polydipsia and polyphagia.  Genitourinary:  Negative for dysuria and urgency.  Musculoskeletal:  Positive for arthralgias. Negative for back pain and myalgias.  Neurological:  Negative for dizziness, weakness, light-headedness and headaches.  Psychiatric/Behavioral:  Negative for dysphoric mood. The patient is not nervous/anxious.     Objective:  BP 132/70   Pulse 84   Temp (!) 97.4 F (36.3 C)   Resp 14   Ht 5\' 6"  (1.676 m)   Wt 157 lb (71.2 kg)   BMI 25.34 kg/m   BP/Weight 05/29/2020 02/21/2020 12/03/2019  Systolic BP 132 124 -  Diastolic BP 70 68 -  Wt. (Lbs) 157 152 154  BMI 25.34 24.53 24.86    Physical Exam Vitals reviewed.  Constitutional:      Appearance: Normal appearance. She is normal weight.  HENT:     Right Ear: Tympanic membrane, ear canal and external ear normal.     Left Ear: Tympanic membrane and external ear normal.     Nose: Mucosal edema and congestion present.     Right Turbinates: Pale.     Left Turbinates: Pale.     Comments: Sinus tenderness.     Mouth/Throat:     Pharynx: Oropharynx is clear.  Cardiovascular:     Rate and Rhythm: Normal rate and regular rhythm.     Pulses: Normal pulses.     Heart sounds: Normal heart sounds. No murmur heard. Pulmonary:     Effort: Pulmonary effort is normal. No  respiratory distress.     Breath sounds: Normal breath sounds.  Neurological:     Mental Status: She is alert and oriented to person, place, and time.  Psychiatric:        Mood and Affect: Mood normal.    Diabetic Foot Exam - Simple   No data filed      Lab Results  Component Value Date   WBC 5.2 05/29/2020   HGB 14.2 05/29/2020   HCT 42.9 05/29/2020   PLT 325 05/29/2020   GLUCOSE 102 (  H) 05/29/2020   CHOL 157 05/29/2020   TRIG 154 (H) 05/29/2020   HDL 56 05/29/2020   LDLCALC 75 05/29/2020   ALT 20 05/29/2020   AST 19 05/29/2020   NA 139 05/29/2020   K 4.7 05/29/2020   CL 100 05/29/2020   CREATININE 0.66 05/29/2020   BUN 10 05/29/2020   CO2 26 05/29/2020   TSH 2.510 03/06/2019   HGBA1C 6.1 (H) 05/29/2020   MICROALBUR 10 05/29/2020      Assessment & Plan:   1. Prediabetes Recommend continue to work on eating healthy diet and exercise. - POCT UA - Microalbumin - Hemoglobin A1c  2. Mixed hyperlipidemia Recommend continue to work on eating healthy diet and exercise. - CBC with Differential/Platelet - Comprehensive metabolic panel - Lipid panel  3. Acute non-recurrent maxillary sinusitis - azithromycin (ZITHROMAX) 250 MG tablet; 2 DAILY FOR FIRST DAY, THEN DECREASE TO ONE DAILY FOR 4 MORE DAYS.  Dispense: 6 tablet; Refill: 0  4. Moderate recurrent major depression (HCC) The current medical regimen is effective;  continue present plan and medications.  5. GAD (generalized anxiety disorder) The current medical regimen is effective;  continue present plan and medications.    Meds ordered this encounter  Medications   azithromycin (ZITHROMAX) 250 MG tablet    Sig: 2 DAILY FOR FIRST DAY, THEN DECREASE TO ONE DAILY FOR 4 MORE DAYS.    Dispense:  6 tablet    Refill:  0   fluticasone (FLONASE) 50 MCG/ACT nasal spray    Sig: Place 2 sprays into both nostrils daily.    Dispense:  16 g    Refill:  5    Orders Placed This Encounter  Procedures   CBC with  Differential/Platelet   Comprehensive metabolic panel   Hemoglobin A1c   Lipid panel   Cardiovascular Risk Assessment   POCT UA - Microalbumin     Follow-up: Return in about 3 months (around 08/29/2020) for fasting.  An After Visit Summary was printed and given to the patient.  Blane Ohara, MD [facility] [phone]  I,Katherina A Bramblett,acting as a scribe for Blane Ohara, MD.,have documented all relevant documentation on the behalf of Blane Ohara, MD,as directed by  Blane Ohara, MD while in the presence of Blane Ohara, MD.

## 2020-05-29 NOTE — Patient Instructions (Signed)
Start on zpack.  Start on flonase.

## 2020-05-30 LAB — CBC WITH DIFFERENTIAL/PLATELET
Basophils Absolute: 0 10*3/uL (ref 0.0–0.2)
Basos: 0 %
EOS (ABSOLUTE): 0.1 10*3/uL (ref 0.0–0.4)
Eos: 2 %
Hematocrit: 42.9 % (ref 34.0–46.6)
Hemoglobin: 14.2 g/dL (ref 11.1–15.9)
Immature Grans (Abs): 0 10*3/uL (ref 0.0–0.1)
Immature Granulocytes: 0 %
Lymphocytes Absolute: 1.8 10*3/uL (ref 0.7–3.1)
Lymphs: 35 %
MCH: 29.2 pg (ref 26.6–33.0)
MCHC: 33.1 g/dL (ref 31.5–35.7)
MCV: 88 fL (ref 79–97)
Monocytes Absolute: 0.4 10*3/uL (ref 0.1–0.9)
Monocytes: 8 %
Neutrophils Absolute: 2.8 10*3/uL (ref 1.4–7.0)
Neutrophils: 55 %
Platelets: 325 10*3/uL (ref 150–450)
RBC: 4.87 x10E6/uL (ref 3.77–5.28)
RDW: 13.1 % (ref 11.7–15.4)
WBC: 5.2 10*3/uL (ref 3.4–10.8)

## 2020-05-30 LAB — COMPREHENSIVE METABOLIC PANEL
ALT: 20 IU/L (ref 0–32)
AST: 19 IU/L (ref 0–40)
Albumin/Globulin Ratio: 2.1 (ref 1.2–2.2)
Albumin: 4.6 g/dL (ref 3.7–4.7)
Alkaline Phosphatase: 91 IU/L (ref 44–121)
BUN/Creatinine Ratio: 15 (ref 12–28)
BUN: 10 mg/dL (ref 8–27)
Bilirubin Total: 0.4 mg/dL (ref 0.0–1.2)
CO2: 26 mmol/L (ref 20–29)
Calcium: 9.7 mg/dL (ref 8.7–10.3)
Chloride: 100 mmol/L (ref 96–106)
Creatinine, Ser: 0.66 mg/dL (ref 0.57–1.00)
Globulin, Total: 2.2 g/dL (ref 1.5–4.5)
Glucose: 102 mg/dL — ABNORMAL HIGH (ref 65–99)
Potassium: 4.7 mmol/L (ref 3.5–5.2)
Sodium: 139 mmol/L (ref 134–144)
Total Protein: 6.8 g/dL (ref 6.0–8.5)
eGFR: 94 mL/min/{1.73_m2} (ref >59)

## 2020-05-30 LAB — LIPID PANEL
Chol/HDL Ratio: 2.8 ratio (ref 0.0–4.4)
Cholesterol, Total: 157 mg/dL (ref 100–199)
HDL: 56 mg/dL (ref >39)
LDL Chol Calc (NIH): 75 mg/dL (ref 0–99)
Triglycerides: 154 mg/dL — ABNORMAL HIGH (ref 0–149)
VLDL Cholesterol Cal: 26 mg/dL (ref 5–40)

## 2020-05-30 LAB — HEMOGLOBIN A1C
Est. average glucose Bld gHb Est-mCnc: 128 mg/dL
Hgb A1c MFr Bld: 6.1 % — ABNORMAL HIGH (ref 4.8–5.6)

## 2020-05-30 LAB — CARDIOVASCULAR RISK ASSESSMENT

## 2020-06-30 ENCOUNTER — Other Ambulatory Visit: Payer: Self-pay | Admitting: Family Medicine

## 2020-07-03 ENCOUNTER — Telehealth: Payer: Self-pay

## 2020-07-03 DIAGNOSIS — K219 Gastro-esophageal reflux disease without esophagitis: Secondary | ICD-10-CM

## 2020-07-03 MED ORDER — FAMOTIDINE 20 MG PO TABS: 20.0000 mg | ORAL_TABLET | Freq: Every day | ORAL | 0 refills | Status: DC

## 2020-07-03 NOTE — Addendum Note (Signed)
Addended by: Lucia Gaskins B on: 07/03/2020 04:33 PM   Modules accepted: Orders

## 2020-07-03 NOTE — Telephone Encounter (Signed)
  Chronic Care Management   Note  07/03/2020 Name: Virginia Montoya MRN: [id number] DOB: 1950-07-02  Patient called pharmacist to discuss options of increasing omeprazole to help with increased heartburn symptoms lately. Recommend considering adding famotidine or possibly increasing omeprazole for symptom management per patient request. Please advise. Pharmacist will contact patient.   Juliane Lack, PharmD, [facility] Clinical Pharmacist [facility] [phone] (office) [phone] (mobile)

## 2020-07-04 ENCOUNTER — Telehealth: Payer: Self-pay

## 2020-07-04 NOTE — Progress Notes (Signed)
    Chronic Care Management Pharmacy Assistant   Name: Virginia Montoya  MRN: [id number] DOB: Jul 04, 1950  Reason for Encounter: Medication Review for addition of Famotidine     Medications: Outpatient Encounter Medications as of 07/04/2020  Medication Sig   acetaminophen (TYLENOL) 500 MG tablet Take 500 mg every 6 (six) hours as needed by mouth.   azithromycin (ZITHROMAX) 250 MG tablet 2 DAILY FOR FIRST DAY, THEN DECREASE TO ONE DAILY FOR 4 MORE DAYS.   cholecalciferol (VITAMIN D3) 25 MCG (1000 UNIT) tablet Take 1,000 Units by mouth 2 (two) times daily.   D-Mannose 500 MG CAPS Take 500 mg by mouth daily.   diclofenac (VOLTAREN) 75 MG EC tablet Take 75 mg by mouth 2 (two) times daily. (Patient not taking: No sig reported)   famotidine (PEPCID) 20 MG tablet Take 1 tablet (20 mg total) by mouth daily.   fluticasone (FLONASE) 50 MCG/ACT nasal spray Place 2 sprays into both nostrils daily.   LORazepam (ATIVAN) 0.5 MG tablet TAKE 1 TABLET(0.5 MG) BY MOUTH EVERY 8 HOURS AS NEEDED FOR ANXIETY   Multiple Vitamins-Minerals (PRESERVISION AREDS 2 PO) Take by mouth.   omeprazole (PRILOSEC) 20 MG capsule Take 1 capsule (20 mg total) by mouth daily.   pyridOXINE (VITAMIN B-6) 100 MG tablet Take 100 mg by mouth daily.   rosuvastatin (CRESTOR) 20 MG tablet TAKE 1 TABLET(20 MG) BY MOUTH EVERY DAY   venlafaxine XR (EFFEXOR-XR) 75 MG 24 hr capsule TAKE 1 CAPSULE(75 MG) BY MOUTH DAILY WITH BREAKFAST   vitamin B-12 (CYANOCOBALAMIN) 1000 MCG tablet Take 1,000 mcg by mouth daily.   zolpidem (AMBIEN) 10 MG tablet TAKE 1 TABLET BY MOUTH AT BEDTIME   No facility-administered encounter medications on file as of 07/04/2020.   Lucia Gaskins CPP, asked me to contact patient and let her know that Dr. Sedalia Muta had sent in Famotidine to go along with her Omeprazole she is currently taking.  I have called and left a detailed message for patient, as well as my call back information if she should have additional questions.   Leilani Able, CMA Clinical Pharmacist Assistant [phone]

## 2020-07-06 ENCOUNTER — Other Ambulatory Visit: Payer: Self-pay | Admitting: Family Medicine

## 2020-08-26 ENCOUNTER — Telehealth: Payer: Self-pay

## 2020-08-26 NOTE — Chronic Care Management (AMB) (Signed)
    Chronic Care Management Pharmacy Assistant   Name: Virginia Montoya  MRN: [id number] DOB: 09/27/1950  Reason for Encounter: General Adherence Call  Recent office visits:  No visits noted  Recent consult visits:  No visits noted  Hospital visits:  None in previous 6 months  Medications: Outpatient Encounter Medications as of 08/26/2020  Medication Sig   acetaminophen (TYLENOL) 500 MG tablet Take 500 mg every 6 (six) hours as needed by mouth.   azithromycin (ZITHROMAX) 250 MG tablet 2 DAILY FOR FIRST DAY, THEN DECREASE TO ONE DAILY FOR 4 MORE DAYS.   cholecalciferol (VITAMIN D3) 25 MCG (1000 UNIT) tablet Take 1,000 Units by mouth 2 (two) times daily.   D-Mannose 500 MG CAPS Take 500 mg by mouth daily.   diclofenac (VOLTAREN) 75 MG EC tablet Take 75 mg by mouth 2 (two) times daily. (Patient not taking: No sig reported)   famotidine (PEPCID) 20 MG tablet Take 1 tablet (20 mg total) by mouth daily.   fluticasone (FLONASE) 50 MCG/ACT nasal spray Place 2 sprays into both nostrils daily.   LORazepam (ATIVAN) 0.5 MG tablet TAKE 1 TABLET(0.5 MG) BY MOUTH EVERY 8 HOURS AS NEEDED FOR ANXIETY   Multiple Vitamins-Minerals (PRESERVISION AREDS 2 PO) Take by mouth.   omeprazole (PRILOSEC) 20 MG capsule TAKE 1 CAPSULE(20 MG) BY MOUTH DAILY   pyridOXINE (VITAMIN B-6) 100 MG tablet Take 100 mg by mouth daily.   rosuvastatin (CRESTOR) 20 MG tablet TAKE 1 TABLET(20 MG) BY MOUTH EVERY DAY   venlafaxine XR (EFFEXOR-XR) 75 MG 24 hr capsule TAKE 1 CAPSULE(75 MG) BY MOUTH DAILY WITH BREAKFAST   vitamin B-12 (CYANOCOBALAMIN) 1000 MCG tablet Take 1,000 mcg by mouth daily.   zolpidem (AMBIEN) 10 MG tablet TAKE 1 TABLET BY MOUTH AT BEDTIME   No facility-administered encounter medications on file as of 08/26/2020.    Several unsuccessful attempts made to contact patient   Star Medications: Medication Name/mg Last Fill Days Supply Rosuvastatin 20 mg  07/29/20 90 DS  What concerns do you have about your  medications?   How often do you forget or accidentally miss a dose?   Do you use a pillbox?   Are you having any problems getting your medications from your pharmacy?   Has the cost of your medications been a concern?  If yes, what medication and is patient assistance available or has it been applied for?   Patient states BP and BG readings are as follows  Fasting:  Before meals:  After meals:  Bedtime:     DATE:             BP               PULSE    Care Gaps: Last annual wellness visit? Upcoming 12/04/20 If applicable: N/A Last eye exam / retinopathy screening? Diabetic foot exam?  Purvis Kilts CPA,CMA

## 2020-09-05 ENCOUNTER — Ambulatory Visit (INDEPENDENT_AMBULATORY_CARE_PROVIDER_SITE_OTHER): Payer: PPO | Admitting: Family Medicine

## 2020-09-05 ENCOUNTER — Other Ambulatory Visit: Payer: Self-pay

## 2020-09-05 VITALS — BP 142/78 | HR 96 | Temp 97.2°F | Resp 18 | Ht 66.0 in | Wt 160.0 lb

## 2020-09-05 DIAGNOSIS — Z23 Encounter for immunization: Secondary | ICD-10-CM

## 2020-09-05 DIAGNOSIS — E782 Mixed hyperlipidemia: Secondary | ICD-10-CM | POA: Diagnosis not present

## 2020-09-05 DIAGNOSIS — R0789 Other chest pain: Secondary | ICD-10-CM

## 2020-09-05 DIAGNOSIS — K219 Gastro-esophageal reflux disease without esophagitis: Secondary | ICD-10-CM

## 2020-09-05 DIAGNOSIS — R7303 Prediabetes: Secondary | ICD-10-CM

## 2020-09-05 DIAGNOSIS — F330 Major depressive disorder, recurrent, mild: Secondary | ICD-10-CM | POA: Diagnosis not present

## 2020-09-05 MED ORDER — FAMOTIDINE 20 MG PO TABS: 20.0000 mg | ORAL_TABLET | Freq: Two times a day (BID) | ORAL | 1 refills | Status: DC

## 2020-09-05 NOTE — Progress Notes (Signed)
Subjective:  Patient ID: Virginia Montoya, female    DOB: [birth year]  Age: 70 y.o. MRN: [id number]  Chief Complaint  Patient presents with   Hyperlipidemia   Gastroesophageal Reflux   HPI: PreDiabetes:  Most recent A1C: 6.1. Pertinent negatives for hypoglycemia include no dizziness, headaches.  Hyperlipidemia: Associated symptoms include chest pain (crying. lasted about 10 minutes. radiated to her back. No accompanying sxs except became diaphoretic. Drank some water and felt better.). Pertinent negatives include no myalgias or shortness of breath.  Current medications: crestor 20 mg once daily Lifestyle changes: tries to eat healthy.   Depression/Anxiety: currenlty on effexor xr 75 mg once daily in am, lorazepam 0.5 mg one three times a day as needed. Pt has increased stress related to her daughter's metastatic cancer. She is receiving palliative care. They are pleased with the medical care she is receiving.    Current Outpatient Medications on File Prior to Visit  Medication Sig Dispense Refill   acetaminophen (TYLENOL) 500 MG tablet Take 500 mg every 6 (six) hours as needed by mouth.     cholecalciferol (VITAMIN D3) 25 MCG (1000 UNIT) tablet Take 1,000 Units by mouth 2 (two) times daily.     D-Mannose 500 MG CAPS Take 500 mg by mouth daily.     diclofenac (VOLTAREN) 75 MG EC tablet Take 75 mg by mouth 2 (two) times daily. (Patient not taking: No sig reported)     fluticasone (FLONASE) 50 MCG/ACT nasal spray Place 2 sprays into both nostrils daily. 16 g 5   LORazepam (ATIVAN) 0.5 MG tablet TAKE 1 TABLET(0.5 MG) BY MOUTH EVERY 8 HOURS AS NEEDED FOR ANXIETY 90 tablet 2   Multiple Vitamins-Minerals (PRESERVISION AREDS 2 PO) Take by mouth.     omeprazole (PRILOSEC) 20 MG capsule TAKE 1 CAPSULE(20 MG) BY MOUTH DAILY 90 capsule 3   pyridOXINE (VITAMIN B-6) 100 MG tablet Take 100 mg by mouth daily.     rosuvastatin (CRESTOR) 20 MG tablet TAKE 1 TABLET(20 MG) BY MOUTH EVERY DAY 90 tablet 1    venlafaxine XR (EFFEXOR-XR) 75 MG 24 hr capsule TAKE 1 CAPSULE(75 MG) BY MOUTH DAILY WITH BREAKFAST 90 capsule 1   vitamin B-12 (CYANOCOBALAMIN) 1000 MCG tablet Take 1,000 mcg by mouth daily.     zolpidem (AMBIEN) 10 MG tablet TAKE 1 TABLET BY MOUTH AT BEDTIME 30 tablet 5   No current facility-administered medications on file prior to visit.   Past Medical History:  Diagnosis Date   Chronic insomnia    Depression    Gastroesophageal reflux disease    Hyperlipidemia    Osteoporosis    Prediabetes    Past Surgical History:  Procedure Laterality Date   CHOLECYSTECTOMY  1999   CORONARY CALCIUM SCORE/CORONARY CTA  01/2017   Coronary calcium score is 0.  Normal coronary origin with normal right dominance.  No evidence of CAD.  Most likely noncardiac chest pain.   SHOULDER SURGERY  2005   TONSILLECTOMY     TUBAL LIGATION  1977    Family History  Problem Relation Age of Onset   Diabetes Mother    Hyperlipidemia Mother    Emphysema Father    Diabetes Maternal Grandmother    Liver cancer Maternal Grandmother    Social History   Socioeconomic History   Marital status: Married    Spouse name: Not on file   Number of children: 3   Years of education: 12   Highest education level: Associate degree: academic program  Occupational History   Occupation: Retired  Tobacco Use   Smoking status: Never   Smokeless tobacco: Never  Substance and Sexual Activity   Alcohol use: No   Drug use: No   Sexual activity: Not Currently  Other Topics Concern   Not on file  Social History Narrative   She is a married (remarried) mother of 3 with 4 grandchildren.  She has 3 great-grandchildren.  She previously worked at [employer] in the [employer].  She completed high school and beauty school.   She works outside a lot in the yard on gardening.  She does a lot of walking and exercises with walking for at least a half an hour a day 5 days a week.   wears sunscreen, brushes and flosses daily, see's  dentist bi-annually, has smoke/carbon monoxide detectors, wears a seatbelt and practices gun safety   Social Determinants of Health   Financial Resource Strain: Not on file  Food Insecurity: No Food Insecurity   Worried About Programme researcher, broadcasting/film/video in the Last Year: Never true   Ran Out of Food in the Last Year: Never true  Transportation Needs: No Transportation Needs   Lack of Transportation (Medical): No   Lack of Transportation (Non-Medical): No  Physical Activity: Not on file  Stress: Not on file  Social Connections: Not on file    Review of Systems  Constitutional:  Positive for fatigue. Negative for chills and fever.  HENT:  Negative for congestion, ear pain and sore throat.   Respiratory:  Negative for cough and shortness of breath.   Cardiovascular:  Positive for chest pain (Pt had been crying when the chest pain came on just after a visit with oncology for her daughter. lasted about 10 minutes. radiated to her back. No accompanying sxs except became diaphoretic. Drank some water and felt better. Has not recurred.).  Gastrointestinal:  Negative for abdominal pain, constipation, diarrhea, nausea and vomiting.  Genitourinary:  Negative for dysuria and urgency.  Musculoskeletal:  Negative for arthralgias and myalgias.  Skin:  Negative for rash.  Neurological:  Negative for dizziness and headaches.  Psychiatric/Behavioral:  Negative for dysphoric mood. The patient is not nervous/anxious.     Objective:  BP (!) 142/78   Pulse 96   Temp (!) 97.2 F (36.2 C)   Resp 18   Ht 5\' 6"  (1.676 m)   Wt 160 lb (72.6 kg)   BMI 25.82 kg/m   BP/Weight 09/05/2020 05/29/2020 02/21/2020  Systolic BP 142 132 124  Diastolic BP 78 70 68  Wt. (Lbs) 160 157 152  BMI 25.82 25.34 24.53    Physical Exam Vitals reviewed.  Constitutional:      Appearance: Normal appearance. She is normal weight.  Neck:     Vascular: No carotid bruit.  Cardiovascular:     Rate and Rhythm: Normal rate and  regular rhythm.     Heart sounds: Normal heart sounds.  Pulmonary:     Effort: Pulmonary effort is normal. No respiratory distress.     Breath sounds: Normal breath sounds.  Abdominal:     General: Abdomen is flat. Bowel sounds are normal.     Palpations: Abdomen is soft.     Tenderness: There is no abdominal tenderness.  Neurological:     Mental Status: She is alert and oriented to person, place, and time.  Psychiatric:        Mood and Affect: Mood normal.        Behavior: Behavior normal.  Diabetic Foot Exam - Simple   No data filed      Lab Results  Component Value Date   WBC 5.6 09/05/2020   HGB 14.3 09/05/2020   HCT 42.1 09/05/2020   PLT 320 09/05/2020   GLUCOSE 118 (H) 09/05/2020   CHOL 143 09/05/2020   TRIG 112 09/05/2020   HDL 58 09/05/2020   LDLCALC 65 09/05/2020   ALT 19 09/05/2020   AST 19 09/05/2020   NA 140 09/05/2020   K 4.5 09/05/2020   CL 103 09/05/2020   CREATININE 0.65 09/05/2020   BUN 12 09/05/2020   CO2 22 09/05/2020   TSH 2.510 03/06/2019   HGBA1C 6.2 (H) 09/05/2020   MICROALBUR 10 05/29/2020      Assessment & Plan:   1. Prediabetes Recommend continue to work on eating healthy diet and exercise.  - Hemoglobin A1c  2. Gastroesophageal reflux disease, unspecified whether esophagitis present The current medical regimen is effective;  continue present plan and medications. - famotidine (PEPCID) 20 MG tablet; Take 1 tablet (20 mg total) by mouth 2 (two) times daily.  Dispense: 180 tablet; Refill: 1  3. Mixed hyperlipidemia Well controlled.  No changes to medicines.  Continue to work on eating a healthy diet and exercise.  Labs drawn today.   - CBC with Differential/Platelet - Comprehensive metabolic panel - Lipid panel  4. Need for influenza vaccination - Flu Vaccine QUAD High Dose(Fluad)  5. Mild, depression, recurrent. Well controlled. Appropriate sadness related to her daughter's poor prognosis.   6. Other chest pain.  EKG  NSR, no st changes.  Likely secondary to anxiety.   Meds ordered this encounter  Medications   famotidine (PEPCID) 20 MG tablet    Sig: Take 1 tablet (20 mg total) by mouth 2 (two) times daily.    Dispense:  180 tablet    Refill:  1    Orders Placed This Encounter  Procedures   Flu Vaccine QUAD High Dose(Fluad)   CBC with Differential/Platelet   Hemoglobin A1c   Comprehensive metabolic panel   Lipid panel   Cardiovascular Risk Assessment   EKG 12-Lead     Follow-up: Return in about 3 months (around 12/05/2020) for fasting.  An After Visit Summary was printed and given to the patient.  Blane Ohara, MD [facility] [phone]

## 2020-09-06 LAB — CBC WITH DIFFERENTIAL/PLATELET
Basophils Absolute: 0 10*3/uL (ref 0.0–0.2)
Basos: 1 %
EOS (ABSOLUTE): 0.1 10*3/uL (ref 0.0–0.4)
Eos: 1 %
Hematocrit: 42.1 % (ref 34.0–46.6)
Hemoglobin: 14.3 g/dL (ref 11.1–15.9)
Immature Grans (Abs): 0 10*3/uL (ref 0.0–0.1)
Immature Granulocytes: 0 %
Lymphocytes Absolute: 1.6 10*3/uL (ref 0.7–3.1)
Lymphs: 28 %
MCH: 30 pg (ref 26.6–33.0)
MCHC: 34 g/dL (ref 31.5–35.7)
MCV: 88 fL (ref 79–97)
Monocytes Absolute: 0.4 10*3/uL (ref 0.1–0.9)
Monocytes: 7 %
Neutrophils Absolute: 3.5 10*3/uL (ref 1.4–7.0)
Neutrophils: 63 %
Platelets: 320 10*3/uL (ref 150–450)
RBC: 4.76 x10E6/uL (ref 3.77–5.28)
RDW: 13.1 % (ref 11.7–15.4)
WBC: 5.6 10*3/uL (ref 3.4–10.8)

## 2020-09-06 LAB — COMPREHENSIVE METABOLIC PANEL
ALT: 19 IU/L (ref 0–32)
AST: 19 IU/L (ref 0–40)
Albumin/Globulin Ratio: 2.1 (ref 1.2–2.2)
Albumin: 4.7 g/dL (ref 3.7–4.7)
Alkaline Phosphatase: 83 IU/L (ref 44–121)
BUN/Creatinine Ratio: 18 (ref 12–28)
BUN: 12 mg/dL (ref 8–27)
Bilirubin Total: 0.5 mg/dL (ref 0.0–1.2)
CO2: 22 mmol/L (ref 20–29)
Calcium: 9.5 mg/dL (ref 8.7–10.3)
Chloride: 103 mmol/L (ref 96–106)
Creatinine, Ser: 0.65 mg/dL (ref 0.57–1.00)
Globulin, Total: 2.2 g/dL (ref 1.5–4.5)
Glucose: 118 mg/dL — ABNORMAL HIGH (ref 65–99)
Potassium: 4.5 mmol/L (ref 3.5–5.2)
Sodium: 140 mmol/L (ref 134–144)
Total Protein: 6.9 g/dL (ref 6.0–8.5)
eGFR: 94 mL/min/{1.73_m2} (ref >59)

## 2020-09-06 LAB — HEMOGLOBIN A1C
Est. average glucose Bld gHb Est-mCnc: 131 mg/dL
Hgb A1c MFr Bld: 6.2 % — ABNORMAL HIGH (ref 4.8–5.6)

## 2020-09-06 LAB — CARDIOVASCULAR RISK ASSESSMENT

## 2020-09-06 LAB — LIPID PANEL
Chol/HDL Ratio: 2.5 ratio (ref 0.0–4.4)
Cholesterol, Total: 143 mg/dL (ref 100–199)
HDL: 58 mg/dL (ref >39)
LDL Chol Calc (NIH): 65 mg/dL (ref 0–99)
Triglycerides: 112 mg/dL (ref 0–149)
VLDL Cholesterol Cal: 20 mg/dL (ref 5–40)

## 2020-09-08 ENCOUNTER — Encounter: Payer: Self-pay | Admitting: Family Medicine

## 2020-09-25 ENCOUNTER — Other Ambulatory Visit: Payer: Self-pay | Admitting: Family Medicine

## 2020-09-29 ENCOUNTER — Other Ambulatory Visit: Payer: Self-pay

## 2020-10-01 ENCOUNTER — Encounter: Payer: Self-pay | Admitting: Physician Assistant

## 2020-10-01 ENCOUNTER — Telehealth (INDEPENDENT_AMBULATORY_CARE_PROVIDER_SITE_OTHER): Payer: PPO | Admitting: Physician Assistant

## 2020-10-01 VITALS — Temp 98.0°F | Ht 66.0 in | Wt 160.0 lb

## 2020-10-01 DIAGNOSIS — U071 COVID-19: Secondary | ICD-10-CM

## 2020-10-01 MED ORDER — MOLNUPIRAVIR EUA 200MG CAPSULE: 4.0000 | ORAL_CAPSULE | Freq: Two times a day (BID) | ORAL | 0 refills | Status: AC

## 2020-10-01 NOTE — Progress Notes (Signed)
Virtual Visit via Telephone Note   This visit type was conducted due to national recommendations for restrictions regarding the COVID-19 Pandemic (e.g. social distancing) in an effort to limit this patient's exposure and mitigate transmission in our community.  Due to her co-morbid illnesses, this patient is at least at moderate risk for complications without adequate follow up.  This format is felt to be most appropriate for this patient at this time.  The patient did not have access to video technology/had technical difficulties with video requiring transitioning to audio format only (telephone).  All issues noted in this document were discussed and addressed.  No physical exam could be performed with this format.  Patient verbally consented to a telehealth visit.   Date:  10/01/2020   ID:  Virginia Montoya, DOB 09-17-1950, MRN [id number]  Patient Location: Home Provider Location: Office  PCP:  Blane Ohara, MD   Chief Complaint:  malaise/congestion  History of Present Illness:    Virginia Montoya is a 70 y.o. female with complaints of pnd, runny nose, headache and malaise since yesterday - has not had a fever - husband with similar symtpoms  The patient does have symptoms concerning for COVID-19 infection (fever, chills, cough, or new shortness of breath).    Past Medical History:  Diagnosis Date   Chronic insomnia    Depression    Gastroesophageal reflux disease    Hyperlipidemia    Osteoporosis    Prediabetes    Past Surgical History:  Procedure Laterality Date   CHOLECYSTECTOMY  1999   CORONARY CALCIUM SCORE/CORONARY CTA  01/2017   Coronary calcium score is 0.  Normal coronary origin with normal right dominance.  No evidence of CAD.  Most likely noncardiac chest pain.   SHOULDER SURGERY  2005   TONSILLECTOMY     TUBAL LIGATION  1977     Current Meds  Medication Sig   acetaminophen (TYLENOL) 500 MG tablet Take 500 mg every 6 (six) hours as needed by mouth.    cholecalciferol (VITAMIN D3) 25 MCG (1000 UNIT) tablet Take 1,000 Units by mouth 2 (two) times daily.   D-Mannose 500 MG CAPS Take 500 mg by mouth daily.   diclofenac (VOLTAREN) 75 MG EC tablet Take 75 mg by mouth 2 (two) times daily.   famotidine (PEPCID) 20 MG tablet Take 1 tablet (20 mg total) by mouth 2 (two) times daily.   fluticasone (FLONASE) 50 MCG/ACT nasal spray Place 2 sprays into both nostrils daily.   LORazepam (ATIVAN) 0.5 MG tablet TAKE 1 TABLET(0.5 MG) BY MOUTH EVERY 8 HOURS AS NEEDED FOR ANXIETY   Multiple Vitamins-Minerals (PRESERVISION AREDS 2 PO) Take by mouth.   omeprazole (PRILOSEC) 20 MG capsule TAKE 1 CAPSULE(20 MG) BY MOUTH DAILY   pyridOXINE (VITAMIN B-6) 100 MG tablet Take 100 mg by mouth daily.   rosuvastatin (CRESTOR) 20 MG tablet TAKE 1 TABLET(20 MG) BY MOUTH EVERY DAY   venlafaxine XR (EFFEXOR-XR) 75 MG 24 hr capsule TAKE 1 CAPSULE(75 MG) BY MOUTH DAILY WITH BREAKFAST   vitamin B-12 (CYANOCOBALAMIN) 1000 MCG tablet Take 1,000 mcg by mouth daily.   zolpidem (AMBIEN) 10 MG tablet TAKE 1 TABLET BY MOUTH AT BEDTIME     Allergies:   Cephalexin, Alendronate, Biaxin [clarithromycin], and Sulfa antibiotics   Social History   Tobacco Use   Smoking status: Never   Smokeless tobacco: Never  Substance Use Topics   Alcohol use: No   Drug use: No     Family Hx:  The patient's family history includes Diabetes in her maternal grandmother and mother; Emphysema in her father; Hyperlipidemia in her mother; Liver cancer in her maternal grandmother.  ROS:   Please see the history of present illness.    All other systems reviewed and are negative.  Labs/Other Tests and Data Reviewed:    Recent Labs: 09/05/2020: ALT 19; BUN 12; Creatinine, Ser 0.65; Hemoglobin 14.3; Platelets 320; Potassium 4.5; Sodium 140   Recent Lipid Panel Lab Results  Component Value Date/Time   CHOL 143 09/05/2020 09:57 AM   TRIG 112 09/05/2020 09:57 AM   HDL 58 09/05/2020 09:57 AM   CHOLHDL  2.5 09/05/2020 09:57 AM   LDLCALC 65 09/05/2020 09:57 AM    Wt Readings from Last 3 Encounters:  10/01/20 160 lb (72.6 kg)  09/05/20 160 lb (72.6 kg)  05/29/20 157 lb (71.2 kg)     Objective:    Vital Signs:  Temp 98 F (36.7 C)   Ht 5\' 6"  (1.676 m)   Wt 160 lb (72.6 kg)   BMI 25.82 kg/m    VITAL SIGNS:  reviewed PT WAS NOT ABLE TO SIGN IN ON VIDEO AND HER HOME COVID TEST WAS INDETERMINATE - WILL TREAT EMPIRICALLY ASSESSMENT & PLAN:    COVID 19 - rx for molnupiravir as directed, rest, tylenol, fluids and quarantine according to guidelines  COVID-19 Education: The signs and symptoms of COVID-19 were discussed with the patient and how to seek care for testing (follow up with PCP or arrange E-visit). The importance of social distancing was discussed today.  Time:   Today, I have spent 10 minutes with the patient with telehealth technology discussing the above problems.     Medication Adjustments/Labs and Tests Ordered: Current medicines are reviewed at length with the patient today.  Concerns regarding medicines are outlined above.   Tests Ordered: No orders of the defined types were placed in this encounter.   Medication Changes: No orders of the defined types were placed in this encounter.   Follow Up:  In Person prn  Signed, [employer]  10/01/2020 10:57 AM    [facility]

## 2020-10-10 ENCOUNTER — Encounter: Payer: Self-pay | Admitting: Nurse Practitioner

## 2020-10-10 ENCOUNTER — Ambulatory Visit (INDEPENDENT_AMBULATORY_CARE_PROVIDER_SITE_OTHER): Payer: PPO | Admitting: Nurse Practitioner

## 2020-10-10 VITALS — BP 122/84 | HR 93 | Temp 97.4°F | Ht 66.0 in | Wt 153.0 lb

## 2020-10-10 DIAGNOSIS — J329 Chronic sinusitis, unspecified: Secondary | ICD-10-CM

## 2020-10-10 DIAGNOSIS — F411 Generalized anxiety disorder: Secondary | ICD-10-CM

## 2020-10-10 DIAGNOSIS — U071 COVID-19: Secondary | ICD-10-CM | POA: Diagnosis not present

## 2020-10-10 MED ORDER — AMOXICILLIN-POT CLAVULANATE 875-125 MG PO TABS
1.0000 | ORAL_TABLET | Freq: Two times a day (BID) | ORAL | 0 refills | Status: DC
Start: 2020-10-10 — End: 2020-11-10

## 2020-10-10 MED ORDER — PROMETHAZINE-PHENYLEPHRINE 6.25-5 MG/5ML PO SYRP: 5.0000 mL | ORAL_SOLUTION | Freq: Four times a day (QID) | ORAL | 0 refills | Status: DC | PRN

## 2020-10-10 MED ORDER — VENLAFAXINE HCL ER 150 MG PO CP24
150.0000 mg | ORAL_CAPSULE | Freq: Every day | ORAL | 0 refills | Status: DC
Start: 2020-10-10 — End: 2020-11-06

## 2020-10-10 NOTE — Progress Notes (Signed)
Acute Office Visit  Subjective:    Patient ID: Virginia Montoya, female    DOB: 1950/03/09, 70 y.o.   MRN: [id number]  Chief Complaint  Patient presents with   COVID-19    HPI Patient is in today for evaluation of fatigue, cough, rhinorrhea, and headache. She was diagnosed with Covid on 10/03/20.Treatment included antiviral Molnupiravir. Other treatment has included Alka-Seltzer Cold and Flu.   Virginia Montoya is tearful and crying. She tells me her daughter is dying of metastatic cancer. She is currently prescribed Lorazepam 0.5 mg Q 8 hours PRN and Effexor 75 mg. She is not currently in counseling.  Past Medical History:  Diagnosis Date   Chronic insomnia    Depression    Gastroesophageal reflux disease    Hyperlipidemia    Osteoporosis    Prediabetes     Past Surgical History:  Procedure Laterality Date   CHOLECYSTECTOMY  1999   CORONARY CALCIUM SCORE/CORONARY CTA  01/2017   Coronary calcium score is 0.  Normal coronary origin with normal right dominance.  No evidence of CAD.  Most likely noncardiac chest pain.   SHOULDER SURGERY  2005   TONSILLECTOMY     TUBAL LIGATION  1977    Family History  Problem Relation Age of Onset   Diabetes Mother    Hyperlipidemia Mother    Emphysema Father    Diabetes Maternal Grandmother    Liver cancer Maternal Grandmother     Social History   Socioeconomic History   Marital status: Married    Spouse name: Not on file   Number of children: 3   Years of education: 12   Highest education level: Associate degree: academic program  Occupational History   Occupation: Retired  Tobacco Use   Smoking status: Never   Smokeless tobacco: Never  Substance and Sexual Activity   Alcohol use: No   Drug use: No   Sexual activity: Not Currently  Other Topics Concern   Not on file  Social History Narrative   She is a married (remarried) mother of 3 with 4 grandchildren.  She has 3 great-grandchildren.  She previously worked at [employer] in [place].  She completed high school and beauty school.   She works outside a lot in the yard on gardening.  She does a lot of walking and exercises with walking for at least a half an hour a day 5 days a week.   wears sunscreen, brushes and flosses daily, see's dentist bi-annually, has smoke/carbon monoxide detectors, wears a seatbelt and practices gun safety   Social Determinants of Health   Financial Resource Strain: Not on file  Food Insecurity: No Food Insecurity   Worried About Charity fundraiser in the Last Year: Never true   Ran Out of Food in the Last Year: Never true  Transportation Needs: No Transportation Needs   Lack of Transportation (Medical): No   Lack of Transportation (Non-Medical): No  Physical Activity: Not on file  Stress: Not on file  Social Connections: Not on file  Intimate Partner Violence: Not on file    Outpatient Medications Prior to Visit  Medication Sig Dispense Refill   acetaminophen (TYLENOL) 500 MG tablet Take 500 mg every 6 (six) hours as needed by mouth.     cholecalciferol (VITAMIN D3) 25 MCG (1000 UNIT) tablet Take 1,000 Units by mouth 2 (two) times daily.     D-Mannose 500 MG CAPS Take 500 mg by mouth daily.     diclofenac (  VOLTAREN) 75 MG EC tablet Take 75 mg by mouth 2 (two) times daily.     famotidine (PEPCID) 20 MG tablet Take 1 tablet (20 mg total) by mouth 2 (two) times daily. 180 tablet 1   fluticasone (FLONASE) 50 MCG/ACT nasal spray Place 2 sprays into both nostrils daily. 16 g 5   LORazepam (ATIVAN) 0.5 MG tablet TAKE 1 TABLET(0.5 MG) BY MOUTH EVERY 8 HOURS AS NEEDED FOR ANXIETY 90 tablet 0   Multiple Vitamins-Minerals (PRESERVISION AREDS 2 PO) Take by mouth.     omeprazole (PRILOSEC) 20 MG capsule TAKE 1 CAPSULE(20 MG) BY MOUTH DAILY 90 capsule 3   pyridOXINE (VITAMIN B-6) 100 MG tablet Take 100 mg by mouth daily.     rosuvastatin (CRESTOR) 20 MG tablet TAKE 1 TABLET(20 MG) BY MOUTH EVERY DAY 90 tablet 1   venlafaxine XR (EFFEXOR-XR)  75 MG 24 hr capsule TAKE 1 CAPSULE(75 MG) BY MOUTH DAILY WITH BREAKFAST 90 capsule 1   vitamin B-12 (CYANOCOBALAMIN) 1000 MCG tablet Take 1,000 mcg by mouth daily.     zolpidem (AMBIEN) 10 MG tablet TAKE 1 TABLET BY MOUTH AT BEDTIME 30 tablet 5   No facility-administered medications prior to visit.    Allergies  Allergen Reactions   Cephalexin Nausea And Vomiting    ask   Alendronate     Gi upset   Biaxin [Clarithromycin] Other (See Comments)    CHEST PAIN   Sulfa Antibiotics     Made her feel poorly.     Review of Systems  Constitutional:  Positive for fatigue. Negative for chills and fever.  HENT:  Positive for congestion, rhinorrhea and sinus pressure. Negative for ear pain, postnasal drip, sinus pain and sore throat.   Eyes: Negative.   Respiratory:  Positive for cough. Negative for shortness of breath.   Cardiovascular:  Negative for chest pain.  Gastrointestinal:  Negative for diarrhea and nausea.  Endocrine: Negative.   Genitourinary: Negative.   Allergic/Immunologic: Positive for environmental allergies.  Neurological:  Negative for dizziness and headaches.  Psychiatric/Behavioral:  The patient is nervous/anxious.        Tearful and crying      Objective:    Physical Exam Vitals reviewed.  Constitutional:      Appearance: Normal appearance.  HENT:     Head: Normocephalic.     Right Ear: Tympanic membrane normal.     Left Ear: Tympanic membrane normal.     Nose: Congestion and rhinorrhea present.     Mouth/Throat:     Pharynx: Posterior oropharyngeal erythema present.  Cardiovascular:     Rate and Rhythm: Normal rate and regular rhythm.     Pulses: Normal pulses.     Heart sounds: Normal heart sounds.  Pulmonary:     Effort: Pulmonary effort is normal.     Breath sounds: Normal breath sounds.  Abdominal:     General: Bowel sounds are normal.  Skin:    General: Skin is warm and dry.     Capillary Refill: Capillary refill takes less than 2 seconds.   Neurological:     General: No focal deficit present.     Mental Status: She is alert and oriented to person, place, and time.  Psychiatric:     Comments: Crying     Ht $R'5\' 6"'La$  (1.676 m)   Wt 153 lb (69.4 kg)   BMI 24.69 kg/m  Wt Readings from Last 3 Encounters:  10/10/20 153 lb (69.4 kg)  10/01/20 160 lb (72.6 kg)  09/05/20 160 lb (72.6 kg)    Health Maintenance Due  Topic Date Due   Hepatitis C Screening  Never done   Zoster Vaccines- Shingrix (2 of 2) 12/28/2016   Fecal DNA (Cologuard)  05/27/2019   COVID-19 Vaccine (4 - Booster for Pfizer series) 02/28/2020    Lab Results  Component Value Date   TSH 2.510 03/06/2019   Lab Results  Component Value Date   WBC 5.6 09/05/2020   HGB 14.3 09/05/2020   HCT 42.1 09/05/2020   MCV 88 09/05/2020   PLT 320 09/05/2020   Lab Results  Component Value Date   NA 140 09/05/2020   K 4.5 09/05/2020   CO2 22 09/05/2020   GLUCOSE 118 (H) 09/05/2020   BUN 12 09/05/2020   CREATININE 0.65 09/05/2020   BILITOT 0.5 09/05/2020   ALKPHOS 83 09/05/2020   AST 19 09/05/2020   ALT 19 09/05/2020   PROT 6.9 09/05/2020   ALBUMIN 4.7 09/05/2020   CALCIUM 9.5 09/05/2020   EGFR 94 09/05/2020   Lab Results  Component Value Date   CHOL 143 09/05/2020   Lab Results  Component Value Date   HDL 58 09/05/2020   Lab Results  Component Value Date   LDLCALC 65 09/05/2020   Lab Results  Component Value Date   TRIG 112 09/05/2020   Lab Results  Component Value Date   CHOLHDL 2.5 09/05/2020   Lab Results  Component Value Date   HGBA1C 6.2 (H) 09/05/2020       Assessment & Plan:   1. COVID-19 - promethazine-phenylephrine (PROMETHAZINE VC) 6.25-5 MG/5ML SYRP; Take 5 mLs by mouth every 6 (six) hours as needed for congestion.  Dispense: 118 mL; Refill: 0  2. Sinusitis, unspecified chronicity, unspecified location - amoxicillin-clavulanate (AUGMENTIN) 875-125 MG tablet; Take 1 tablet by mouth 2 (two) times daily.  Dispense: 20  tablet; Refill: 0  3. GAD (generalized anxiety disorder) - venlafaxine XR (EFFEXOR XR) 150 MG 24 hr capsule; Take 1 capsule (150 mg total) by mouth daily with breakfast.  Dispense: 30 capsule; Refill: 0       Increase Effexor to 150 mg daily Take Augmentin twice daily for 10 days Seek emergency medical care for severe or concerning symptoms Take Mucinex for cough Take Promethazine VC for sinus congestion Rest and push fluids Follow up for anxiety medication in 4-weeks  Follow-up: 4-Weeks  An After Visit Summary was printed and given to the patient.  I, Rip Harbour, NP, have reviewed all documentation for this visit. The documentation on 10/13/20 for the exam, diagnosis, procedures, and orders are all accurate and complete.     Signed, Rip Harbour, NP [place] [phone]

## 2020-10-10 NOTE — Patient Instructions (Addendum)
Increase Effexor to 150 mg daily Take Augmentin twice daily for 10 days Seek emergency medical care for severe or concerning symptoms Take Mucinex for cough Take Promethazine VC for sinus congestion Rest and push fluids Follow up for anxiety medication in 4-weeks     Managing Anxiety, Adult After being diagnosed with an anxiety disorder, you may be relieved to know why you have felt or behaved a certain way. You may also feel overwhelmed about the treatment ahead and what it will mean for your life. With care and support, you can manage this condition and recover from it. How to manage lifestyle changes Managing stress and anxiety Stress is your body's reaction to life changes and events, both good and bad. Most stress will last just a few hours, but stress can be ongoing and can lead to more than just stress. Although stress can play a major role in anxiety, it is not the same as anxiety. Stress is usually caused by something external, such as a deadline, test, or competition. Stress normally passes after the triggering event has ended.  Anxiety is caused by something internal, such as imagining a terrible outcome or worrying that something will go wrong that will devastate you. Anxiety often does not go away even after the triggering event is over, and it can become long-term (chronic) worry. It is important to understand the differences between stress and anxiety and to manage your stress effectively so that it does not lead to an anxious response. Talk with your health care provider or a counselor to learn more about reducing anxiety and stress. He or she may suggest tension reduction techniques, such as: Music therapy. This can include creating or listening to music that you enjoy and that inspires you. Mindfulness-based meditation. This involves being aware of your normal breaths while not trying to control your breathing. It can be done while sitting or walking. Centering prayer. This  involves focusing on a word, phrase, or sacred image that means something to you and brings you peace. Deep breathing. To do this, expand your stomach and inhale slowly through your nose. Hold your breath for 3-5 seconds. Then exhale slowly, letting your stomach muscles relax. Self-talk. This involves identifying thought patterns that lead to anxiety reactions and changing those patterns. Muscle relaxation. This involves tensing muscles and then relaxing them. Choose a tension reduction technique that suits your lifestyle and personality. These techniques take time and practice. Set aside 5-15 minutes a day to do them. Therapists can offer counseling and training in these techniques. The training to help with anxiety may be covered by some insurance plans. Other things you can do to manage stress and anxiety include: Keeping a stress/anxiety diary. This can help you learn what triggers your reaction and then learn ways to manage your response. Thinking about how you react to certain situations. You may not be able to control everything, but you can control your response. Making time for activities that help you relax and not feeling guilty about spending your time in this way. Visual imagery and yoga can help you stay calm and relax.  Medicines Medicines can help ease symptoms. Medicines for anxiety include: Anti-anxiety drugs. Antidepressants. Medicines are often used as a primary treatment for anxiety disorder. Medicines will be prescribed by a health care provider. When used together, medicines, psychotherapy, and tension reduction techniques may be the most effective treatment. Relationships Relationships can play a big part in helping you recover. Try to spend more time connecting with trusted  friends and family members. Consider going to couples counseling, taking family education classes, or going to family therapy. Therapy can help you and others better understand your condition. How to  recognize changes in your anxiety Everyone responds differently to treatment for anxiety. Recovery from anxiety happens when symptoms decrease and stop interfering with your daily activities at home or work. This may mean that you will start to: Have better concentration and focus. Worry will interfere less in your daily thinking. Sleep better. Be less irritable. Have more energy. Have improved memory. It is important to recognize when your condition is getting worse. Contact your health care provider if your symptoms interfere with home or work and you feel like your condition is not improving. Follow these instructions at home: Activity Exercise. Most adults should do the following: Exercise for at least 150 minutes each week. The exercise should increase your heart rate and make you sweat (moderate-intensity exercise). Strengthening exercises at least twice a week. Get the right amount and quality of sleep. Most adults need 7-9 hours of sleep each night. Lifestyle  Eat a healthy diet that includes plenty of vegetables, fruits, whole grains, low-fat dairy products, and lean protein. Do not eat a lot of foods that are high in solid fats, added sugars, or salt. Make choices that simplify your life. Do not use any products that contain nicotine or tobacco, such as cigarettes, e-cigarettes, and chewing tobacco. If you need help quitting, ask your health care provider. Avoid caffeine, alcohol, and certain over-the-counter cold medicines. These may make you feel worse. Ask your pharmacist which medicines to avoid. General instructions Take over-the-counter and prescription medicines only as told by your health care provider. Keep all follow-up visits as told by your health care provider. This is important. Where to find support You can get help and support from these sources: Self-help groups. Online and [employer]. A trusted spiritual leader. Couples counseling. Family  education classes. Family therapy. Where to find more information You may find that joining a support group helps you deal with your anxiety. The following sources can help you locate counselors or support groups near you: Mental Health America: www.mentalhealthamerica.net Anxiety and Depression Association of [place] (ADAA): ProgramCam.de [employer] on Mental Illness (NAMI): www.nami.org Contact a health care provider if you: Have a hard time staying focused or finishing daily tasks. Spend many hours a day feeling worried about everyday life. Become exhausted by worry. Start to have headaches, feel tense, or have nausea. Urinate more than normal. Have diarrhea. Get help right away if you have: A racing heart and shortness of breath. Thoughts of hurting yourself or others. If you ever feel like you may hurt yourself or others, or have thoughts about taking your own life, get help right away. You can go to your nearest emergency department or call: Your local emergency services (911 in the [place] A suicide crisis helpline, such as the National Suicide Prevention Lifeline at 628 370 5895. This is open 24 hours a day. Summary Taking steps to learn and use tension reduction techniques can help calm you and help prevent triggering an anxiety reaction. When used together, medicines, psychotherapy, and tension reduction techniques may be the most effective treatment. Family, friends, and partners can play a big part in helping you recover from an anxiety disorder. This information is not intended to replace advice given to you by your health care provider. Make sure you discuss any questions you have with your health care provider. Document Revised: 05/23/2018 Document Reviewed:  05/23/2018 Elsevier Patient Education  2022 [employer] Inc.    Sinusitis, Adult Sinusitis is soreness and swelling (inflammation) of your sinuses. Sinuses are hollow spaces in the bones around your face. They  are located: Around your eyes. In the middle of your forehead. Behind your nose. In your cheekbones. Your sinuses and nasal passages are lined with a fluid called mucus. Mucus drains out of your sinuses. Swelling can trap mucus in your sinuses. This lets germs (bacteria, virus, or fungus) grow, which leads to infection. Most of the time, this condition is caused by a virus. What are the causes? This condition is caused by: Allergies. Asthma. Germs. Things that block your nose or sinuses. Growths in the nose (nasal polyps). Chemicals or irritants in the air. Fungus (rare). What increases the risk? You are more likely to develop this condition if: You have a weak body defense system (immune system). You do a lot of swimming or diving. You use nasal sprays too much. You smoke. What are the signs or symptoms? The main symptoms of this condition are pain and a feeling of pressure around the sinuses. Other symptoms include: Stuffy nose (congestion). Runny nose (drainage). Swelling and warmth in the sinuses. Headache. Toothache. A cough that may get worse at night. Mucus that collects in the throat or the back of the nose (postnasal drip). Being unable to smell and taste. Being very tired (fatigue). A fever. Sore throat. Bad breath. How is this diagnosed? This condition is diagnosed based on: Your symptoms. Your medical history. A physical exam. Tests to find out if your condition is short-term (acute) or long-term (chronic). Your doctor may: Check your nose for growths (polyps). Check your sinuses using a tool that has a light (endoscope). Check for allergies or germs. Do imaging tests, such as an MRI or CT scan. How is this treated? Treatment for this condition depends on the cause and whether it is short-term or long-term. If caused by a virus, your symptoms should go away on their own within 10 days. You may be given medicines to relieve symptoms. They include: Medicines  that shrink swollen tissue in the nose. Medicines that treat allergies (antihistamines). A spray that treats swelling of the nostrils.  Rinses that help get rid of thick mucus in your nose (nasal saline washes). If caused by bacteria, your doctor may wait to see if you will get better without treatment. You may be given antibiotic medicine if you have: A very bad infection. A weak body defense system. If caused by growths in the nose, you may need to have surgery. Follow these instructions at home: Medicines Take, use, or apply over-the-counter and prescription medicines only as told by your doctor. These may include nasal sprays. If you were prescribed an antibiotic medicine, take it as told by your doctor. Do not stop taking the antibiotic even if you start to feel better. Hydrate and humidify  Drink enough water to keep your pee (urine) pale yellow. Use a cool mist humidifier to keep the humidity level in your home above 50%. Breathe in steam for 10-15 minutes, 3-4 times a day, or as told by your doctor. You can do this in the bathroom while a hot shower is running. Try not to spend time in cool or dry air. Rest Rest as much as you can. Sleep with your head raised (elevated). Make sure you get enough sleep each night. General instructions  Put a warm, moist washcloth on your face 3-4 times a day, or  as often as told by your doctor. This will help with discomfort. Wash your hands often with soap and water. If there is no soap and water, use hand sanitizer. Do not smoke. Avoid being around people who are smoking (secondhand smoke). Keep all follow-up visits as told by your doctor. This is important. Contact a doctor if: You have a fever. Your symptoms get worse. Your symptoms do not get better within 10 days. Get help right away if: You have a very bad headache. You cannot stop throwing up (vomiting). You have very bad pain or swelling around your face or eyes. You have trouble  seeing. You feel confused. Your neck is stiff. You have trouble breathing. Summary Sinusitis is swelling of your sinuses. Sinuses are hollow spaces in the bones around your face. This condition is caused by tissues in your nose that become inflamed or swollen. This traps germs. These can lead to infection. If you were prescribed an antibiotic medicine, take it as told by your doctor. Do not stop taking it even if you start to feel better. Keep all follow-up visits as told by your doctor. This is important. This information is not intended to replace advice given to you by your health care provider. Make sure you discuss any questions you have with your health care provider. Document Revised: 05/23/2017 Document Reviewed: 05/23/2017 Elsevier Patient Education  2022 [employer].

## 2020-11-06 ENCOUNTER — Other Ambulatory Visit: Payer: Self-pay | Admitting: Nurse Practitioner

## 2020-11-06 DIAGNOSIS — H16223 Keratoconjunctivitis sicca, not specified as Sjogren's, bilateral: Secondary | ICD-10-CM | POA: Diagnosis not present

## 2020-11-06 DIAGNOSIS — H353130 Nonexudative age-related macular degeneration, bilateral, stage unspecified: Secondary | ICD-10-CM | POA: Diagnosis not present

## 2020-11-06 DIAGNOSIS — H26493 Other secondary cataract, bilateral: Secondary | ICD-10-CM | POA: Diagnosis not present

## 2020-11-06 DIAGNOSIS — F411 Generalized anxiety disorder: Secondary | ICD-10-CM

## 2020-11-10 ENCOUNTER — Encounter: Payer: Self-pay | Admitting: Nurse Practitioner

## 2020-11-10 ENCOUNTER — Other Ambulatory Visit: Payer: Self-pay

## 2020-11-10 ENCOUNTER — Ambulatory Visit (INDEPENDENT_AMBULATORY_CARE_PROVIDER_SITE_OTHER): Payer: PPO | Admitting: Nurse Practitioner

## 2020-11-10 VITALS — BP 136/82 | HR 88 | Temp 96.9°F | Ht 66.0 in | Wt 154.0 lb

## 2020-11-10 DIAGNOSIS — F411 Generalized anxiety disorder: Secondary | ICD-10-CM | POA: Diagnosis not present

## 2020-11-10 MED ORDER — LORAZEPAM 0.5 MG PO TABS: ORAL_TABLET | ORAL | 0 refills | Status: DC

## 2020-11-10 MED ORDER — VENLAFAXINE HCL ER 75 MG PO CP24: 75.0000 mg | ORAL_CAPSULE | Freq: Every day | ORAL | 1 refills | Status: DC

## 2020-11-10 NOTE — Patient Instructions (Addendum)
Increase Effexor to 225 mg daily for anxiety Continue Lorazepam as needed for anxiety Keep appointment with Dr Virginia Montoya for 12/17/20 at 9:00 AM   Managing Anxiety, Adult After being diagnosed with anxiety, you may be relieved to know why you have felt or behaved a certain way. You may also feel overwhelmed about the treatment ahead and what it will mean for your life. With care and support, you can manage this condition. How to manage lifestyle changes Managing stress and anxiety Stress is your body's reaction to life changes and events, both good and bad. Most stress will last just a few hours, but stress can be ongoing and can lead to more than just stress. Although stress can play a major role in anxiety, it is not the same as anxiety. Stress is usually caused by something external, such as a deadline, test, or competition. Stress normally passes after the triggering event has ended.  Anxiety is caused by something internal, such as imagining a terrible outcome or worrying that something will go wrong that will devastate you. Anxiety often does not go away even after the triggering event is over, and it can become long-term (chronic) worry. It is important to understand the differences between stress and anxiety and to manage your stress effectively so that it does not lead to an anxious response. Talk with your health care provider or a counselor to learn more about reducing anxiety and stress. He or she may suggest tension reduction techniques, such as: Music therapy. Spend time creating or listening to music that you enjoy and that inspires you. Mindfulness-based meditation. Practice being aware of your normal breaths while not trying to control your breathing. It can be done while sitting or walking. Centering prayer. This involves focusing on a word, phrase, or sacred image that means something to you and brings you peace. Deep breathing. To do this, expand your stomach and inhale slowly through  your nose. Hold your breath for 3-5 seconds. Then exhale slowly, letting your stomach muscles relax. Self-talk. Learn to notice and identify thought patterns that lead to anxiety reactions and change those patterns to thoughts that feel peaceful. Muscle relaxation. Taking time to tense muscles and then relax them. Choose a tension reduction technique that fits your lifestyle and personality. These techniques take time and practice. Set aside 5-15 minutes a day to do them. Therapists can offer counseling and training in these techniques. The training to help with anxiety may be covered by some insurance plans. Other things you can do to manage stress and anxiety include: Keeping a stress diary. This can help you learn what triggers your reaction and then learn ways to manage your response. Thinking about how you react to certain situations. You may not be able to control everything, but you can control your response. Making time for activities that help you relax and not feeling guilty about spending your time in this way. Doing visual imagery. This involves imagining or creating mental pictures to help you relax. Practicing yoga. Through yoga poses, you can lower tension and promote relaxation.  Medicines Medicines can help ease symptoms. Medicines for anxiety include: Antidepressant medicines. These are usually prescribed for long-term daily control. Anti-anxiety medicines. These may be added in severe cases, especially when panic attacks occur. Medicines will be prescribed by a health care provider. When used together, medicines, psychotherapy, and tension reduction techniques may be the most effective treatment. Relationships Relationships can play a big part in helping you recover. Try to spend more  time connecting with trusted friends and family members. Consider going to couples counseling if you have a partner, taking family education classes, or going to family therapy. Therapy can help  you and others better understand your condition. How to recognize changes in your anxiety Everyone responds differently to treatment for anxiety. Recovery from anxiety happens when symptoms decrease and stop interfering with your daily activities at home or work. This may mean that you will start to: Have better concentration and focus. Worry will interfere less in your daily thinking. Sleep better. Be less irritable. Have more energy. Have improved memory. It is also important to recognize when your condition is getting worse. Contact your health care provider if your symptoms interfere with home or work and you feel like your condition is not improving. Follow these instructions at home: Activity Exercise. Adults should do the following: Exercise for at least 150 minutes each week. The exercise should increase your heart rate and make you sweat (moderate-intensity exercise). Strengthening exercises at least twice a week. Get the right amount and quality of sleep. Most adults need 7-9 hours of sleep each night. Lifestyle  Eat a healthy diet that includes plenty of vegetables, fruits, whole grains, low-fat dairy products, and lean protein. Do not eat a lot of foods that are high in fats, added sugars, or salt (sodium). Make choices that simplify your life. Do not use any products that contain nicotine or tobacco. These products include cigarettes, chewing tobacco, and vaping devices, such as e-cigarettes. If you need help quitting, ask your health care provider. Avoid caffeine, alcohol, and certain over-the-counter cold medicines. These may make you feel worse. Ask your pharmacist which medicines to avoid. General instructions Take over-the-counter and prescription medicines only as told by your health care provider. Keep all follow-up visits. This is important. Where to find support You can get help and support from these sources: Self-help groups. Online and [employer]. A  trusted spiritual leader. Couples counseling. Family education classes. Family therapy. Where to find more information You may find that joining a support group helps you deal with your anxiety. The following sources can help you locate counselors or support groups near you: [place]: www.mentalhealthamerica.net Anxiety and Depression Association of [place] (ADAA): https://www.clark.net/ National Alliance on Mental Illness (NAMI): www.nami.org Contact a health care provider if: You have a hard time staying focused or finishing daily tasks. You spend many hours a day feeling worried about everyday life. You become exhausted by worry. You start to have headaches or frequently feel tense. You develop chronic nausea or diarrhea. Get help right away if: You have a racing heart and shortness of breath. You have thoughts of hurting yourself or others. If you ever feel like you may hurt yourself or others, or have thoughts about taking your own life, get help right away. Go to your nearest emergency department or: Call your local emergency services (911 in the [place] Call a suicide crisis helpline, such as the [place] at 757-706-4947 or 988 in the [place]. This is open 24 hours a day in the [place] Text the Crisis Text Line at (314)501-2120 (in the [place].). Summary Taking steps to learn and use tension reduction techniques can help calm you and help prevent triggering an anxiety reaction. When used together, medicines, psychotherapy, and tension reduction techniques may be the most effective treatment. Family, friends, and partners can play a big part in supporting you. This information is not intended to replace advice given to you  by your health care provider. Make sure you discuss any questions you have with your health care provider. Document Revised: 07/16/2020 Document Reviewed: 04/13/2020 Elsevier Patient Education  2022 [employer].

## 2020-11-10 NOTE — Progress Notes (Signed)
Subjective:    Patient ID: Virginia Montoya, female    DOB: 1950/04/28  Age: 70 y.o. MRN: [id number]  Chief Complaint  Patient presents with   Anxiety    HPI  Virginia Montoya is a 70 year old Caucasian female that presents for follow-up of generalized anxiety disorder. She was diagnosed with COVID-19 on 10/01/20 and treated with antiviral medication. She was seen in the office a week later for persistent URI symptoms. She became tearful and crying during office visit on 10/10/20. She disclosed that her adult daughter had terminal metastatic cancer. Virginia Montoya has been trying to care for her daughter. She tells me today that her daughter has been moved to [facility]. Virginia Montoya is her daughter's POA and executor of her estate. She tells me that Effexor 150 mg and Lorazepam has improved generalized anxiety symptoms but she still is experiencing symptoms. She denies side effects of medication such as somnolence or falls.   Anxiety, Follow-up  She was last seen for anxiety 4 weeks ago. Changes made at last visit include Increase effexor to 150 mg daily.   She reports excellent compliance with treatment. She reports good tolerance of treatment. She is not having side effects.   She feels her anxiety is moderate and Improved since last visit.  Symptoms: No chest pain No difficulty concentrating  No dizziness No fatigue  No feelings of losing control No insomnia  No irritable No palpitations  No panic attacks No racing thoughts  No shortness of breath No sweating  No tremors/shakes    GAD-7 Results GAD-7 Generalized Anxiety Disorder Screening Tool 11/10/2020 05/20/2020 08/29/2019  1. Feeling Nervous, Anxious, or on Edge 3 1 3   2. Not Being Able to Stop or Control Worrying 3 1 3   3. Worrying Too Much About Different Things 0 1 3  4. Trouble Relaxing 3 1 3   5. Being So Restless it's Hard To Sit Still 0 0 0  6. Becoming Easily Annoyed or Irritable 0 0 0  7. Feeling Afraid As If Something Awful Might Happen 3  0 3  Total GAD-7 Score 12 4 15   Difficulty At Work, Home, or Getting  Along With Others? Not difficult at all - Somewhat difficult    PHQ-9 Scores PHQ9 SCORE ONLY 09/08/2020 02/21/2020 12/03/2019  PHQ-9 Total Score 0 0 0    Current Outpatient Medications on File Prior to Visit  Medication Sig Dispense Refill   acetaminophen (TYLENOL) 500 MG tablet Take 500 mg every 6 (six) hours as needed by mouth.     amoxicillin-clavulanate (AUGMENTIN) 875-125 MG tablet Take 1 tablet by mouth 2 (two) times daily. 20 tablet 0   cholecalciferol (VITAMIN D3) 25 MCG (1000 UNIT) tablet Take 1,000 Units by mouth 2 (two) times daily.     D-Mannose 500 MG CAPS Take 500 mg by mouth daily.     diclofenac (VOLTAREN) 75 MG EC tablet Take 75 mg by mouth 2 (two) times daily.     famotidine (PEPCID) 20 MG tablet Take 1 tablet (20 mg total) by mouth 2 (two) times daily. 180 tablet 1   fluticasone (FLONASE) 50 MCG/ACT nasal spray Place 2 sprays into both nostrils daily. 16 g 5   LORazepam (ATIVAN) 0.5 MG tablet TAKE 1 TABLET(0.5 MG) BY MOUTH EVERY 8 HOURS AS NEEDED FOR ANXIETY 90 tablet 0   Multiple Vitamins-Minerals (PRESERVISION AREDS 2 PO) Take by mouth.     omeprazole (PRILOSEC) 20 MG capsule TAKE 1 CAPSULE(20 MG) BY MOUTH DAILY 90 capsule 3  promethazine-phenylephrine (PROMETHAZINE VC) 6.25-5 MG/5ML SYRP Take 5 mLs by mouth every 6 (six) hours as needed for congestion. 118 mL 0   pyridOXINE (VITAMIN B-6) 100 MG tablet Take 100 mg by mouth daily.     rosuvastatin (CRESTOR) 20 MG tablet TAKE 1 TABLET(20 MG) BY MOUTH EVERY DAY 90 tablet 1   venlafaxine XR (EFFEXOR-XR) 150 MG 24 hr capsule TAKE 1 CAPSULE(150 MG) BY MOUTH DAILY WITH BREAKFAST 30 capsule 0   vitamin B-12 (CYANOCOBALAMIN) 1000 MCG tablet Take 1,000 mcg by mouth daily.     zolpidem (AMBIEN) 10 MG tablet TAKE 1 TABLET BY MOUTH AT BEDTIME 30 tablet 5   No current facility-administered medications on file prior to visit.   Past Medical History:  Diagnosis  Date   Chronic insomnia    Depression    Gastroesophageal reflux disease    Hyperlipidemia    Osteoporosis    Prediabetes    Past Surgical History:  Procedure Laterality Date   CHOLECYSTECTOMY  1999   CORONARY CALCIUM SCORE/CORONARY CTA  01/2017   Coronary calcium score is 0.  Normal coronary origin with normal right dominance.  No evidence of CAD.  Most likely noncardiac chest pain.   SHOULDER SURGERY  2005   TONSILLECTOMY     TUBAL LIGATION  1977    Family History  Problem Relation Age of Onset   Diabetes Mother    Hyperlipidemia Mother    Emphysema Father    Diabetes Maternal Grandmother    Liver cancer Maternal Grandmother    Social History   Socioeconomic History   Marital status: Married    Spouse name: Not on file   Number of children: 3   Years of education: 12   Highest education level: Associate degree: academic program  Occupational History   Occupation: Retired  Tobacco Use   Smoking status: Never   Smokeless tobacco: Never  Substance and Sexual Activity   Alcohol use: No   Drug use: No   Sexual activity: Not Currently  Other Topics Concern   Not on file  Social History Narrative   She is a married (remarried) mother of 3 with 4 grandchildren.  She has 3 great-grandchildren.  She previously worked at [employer] in the [employer].  She completed high school and beauty school.   She works outside a lot in the yard on gardening.  She does a lot of walking and exercises with walking for at least a half an hour a day 5 days a week.   wears sunscreen, brushes and flosses daily, see's dentist bi-annually, has smoke/carbon monoxide detectors, wears a seatbelt and practices gun safety   Social Determinants of Health   Financial Resource Strain: Not on file  Food Insecurity: No Food Insecurity   Worried About Programme researcher, broadcasting/film/video in the Last Year: Never true   Ran Out of Food in the Last Year: Never true  Transportation Needs: No Transportation Needs   Lack  of Transportation (Medical): No   Lack of Transportation (Non-Medical): No  Physical Activity: Not on file  Stress: Not on file  Social Connections: Not on file    Review of Systems  Constitutional:  Negative for chills, fatigue and fever.  HENT:  Negative for congestion, ear pain, postnasal drip, rhinorrhea, sinus pressure, sinus pain and sore throat.   Eyes: Negative.   Respiratory:  Negative for cough and shortness of breath.   Cardiovascular:  Negative for chest pain.  Gastrointestinal: Negative.   Endocrine: Negative.  Genitourinary: Negative.   Musculoskeletal: Negative.   Skin: Negative.   Allergic/Immunologic: Negative.   Neurological:  Negative for dizziness and headaches.  Hematological: Negative.   Psychiatric/Behavioral:  The patient is nervous/anxious.     Objective:  Pulse 88   Temp (!) 96.9 F (36.1 C)   Ht 5\' 6"  (1.676 m)   Wt 154 lb (69.9 kg)   SpO2 99%   BMI 24.86 kg/m  BP 136/82   Pulse 88   Temp (!) 96.9 F (36.1 C)   Ht 5\' 6"  (1.676 m)   Wt 154 lb (69.9 kg)   SpO2 99%   BMI 24.86 kg/m    BP/Weight 11/10/2020 10/10/2020 10/01/2020  Systolic BP - 122 -  Diastolic BP - 84 -  Wt. (Lbs) 154 153 160  BMI 24.86 24.69 25.82    Physical Exam Vitals reviewed.  Constitutional:      Appearance: Normal appearance.  Skin:    General: Skin is warm and dry.     Capillary Refill: Capillary refill takes less than 2 seconds.  Neurological:     General: No focal deficit present.     Mental Status: She is alert and oriented to person, place, and time.  Psychiatric:        Mood and Affect: Mood normal.        Behavior: Behavior normal.        Lab Results  Component Value Date   WBC 5.6 09/05/2020   HGB 14.3 09/05/2020   HCT 42.1 09/05/2020   PLT 320 09/05/2020   GLUCOSE 118 (H) 09/05/2020   CHOL 143 09/05/2020   TRIG 112 09/05/2020   HDL 58 09/05/2020   LDLCALC 65 09/05/2020   ALT 19 09/05/2020   AST 19 09/05/2020   NA 140 09/05/2020   K  4.5 09/05/2020   CL 103 09/05/2020   CREATININE 0.65 09/05/2020   BUN 12 09/05/2020   CO2 22 09/05/2020   TSH 2.510 03/06/2019   HGBA1C 6.2 (H) 09/05/2020   MICROALBUR 10 05/29/2020      Assessment & Plan:   . 1. GAD (generalized anxiety disorder) - LORazepam (ATIVAN) 0.5 MG tablet; TAKE 1 TABLET(0.5 MG) BY MOUTH EVERY 8 HOURS AS NEEDED FOR ANXIETY  Dispense: 90 tablet; Refill: 0 - venlafaxine XR (EFFEXOR XR) 75 MG 24 hr capsule; Take 1 capsule (75 mg total) by mouth daily with breakfast.  Dispense: 30 capsule; Refill: 1  -Continue Effexor 150 mg for total of 225 mg daily -Notify office immediately of any adverse side effects   Increase Effexor to 225 mg daily for anxiety Continue Lorazepam as needed for anxiety Keep appointment with Dr 11/05/2020 for 12/17/20 at 9:00 AM   Follow-up: 12/17/20 at 9:00 AM with Dr 12/19/20  An After Visit Summary was printed and given to the patient.  I, 12/19/20, NP, have reviewed all documentation for this visit. The documentation on 11/10/20 for the exam, diagnosis, procedures, and orders are all accurate and complete.   Janie Morning, NP [facility] [phone]

## 2020-11-12 ENCOUNTER — Other Ambulatory Visit: Payer: Self-pay

## 2020-11-12 MED ORDER — ROSUVASTATIN CALCIUM 20 MG PO TABS: ORAL_TABLET | ORAL | 0 refills | Status: DC

## 2020-11-13 ENCOUNTER — Other Ambulatory Visit: Payer: Self-pay

## 2020-11-13 MED ORDER — ROSUVASTATIN CALCIUM 20 MG PO TABS: ORAL_TABLET | ORAL | 0 refills | Status: DC

## 2020-11-14 ENCOUNTER — Other Ambulatory Visit: Payer: Self-pay

## 2020-11-14 DIAGNOSIS — K219 Gastro-esophageal reflux disease without esophagitis: Secondary | ICD-10-CM

## 2020-11-14 MED ORDER — FAMOTIDINE 20 MG PO TABS: 20.0000 mg | ORAL_TABLET | Freq: Two times a day (BID) | ORAL | 1 refills | Status: DC

## 2020-11-21 ENCOUNTER — Telehealth: Payer: Self-pay

## 2020-11-21 NOTE — Chronic Care Management (AMB) (Signed)
Chronic Care Management Pharmacy Assistant   Name: Virginia Montoya  MRN: [id number] DOB: February 08, 1950   Reason for Encounter: General Adherence Call     Recent office visits:  11/10/20 Flonnie Hailstone NP. Seen for GAD. Increased  Venlafaxine HCI from 150 mg  to 225 mg daily . D/C Augmentin 875-125 mg 2 times daily. Started on Promethazine-Phenylephrine 6.25-5 mg/5 ml every 6 hours prn due to completed course  10/10/20 Flonnie Hailstone NP. Seen for Covid. Started on Augmentin 875-125 mg 2 times daily. Started on Promethazine-Phenylephrine 6.25-5 mg/5 ml every 6 hours prn.   10/01/20 (Video Visit) Marianne Sofia PA-C Seen for sore throat. Started Molnupiravir 800 mg 2 times daily.   09/05/20 Blane Ohara MD. Seen for HLD, Genella Rife. Changed Famotidine 20 mg from daily to 2 times daily. Completed course of Azithromycin 250 mg.   Recent consult visits:  None   Hospital visits:  None   Medications: Outpatient Encounter Medications as of 11/21/2020  Medication Sig   acetaminophen (TYLENOL) 500 MG tablet Take 500 mg every 6 (six) hours as needed by mouth.   cholecalciferol (VITAMIN D3) 25 MCG (1000 UNIT) tablet Take 1,000 Units by mouth 2 (two) times daily.   D-Mannose 500 MG CAPS Take 500 mg by mouth daily.   famotidine (PEPCID) 20 MG tablet Take 1 tablet (20 mg total) by mouth 2 (two) times daily.   LORazepam (ATIVAN) 0.5 MG tablet TAKE 1 TABLET(0.5 MG) BY MOUTH EVERY 8 HOURS AS NEEDED FOR ANXIETY   Multiple Vitamins-Minerals (PRESERVISION AREDS 2 PO) Take by mouth.   omeprazole (PRILOSEC) 20 MG capsule TAKE 1 CAPSULE(20 MG) BY MOUTH DAILY   pyridOXINE (VITAMIN B-6) 100 MG tablet Take 100 mg by mouth daily.   rosuvastatin (CRESTOR) 20 MG tablet One at night   venlafaxine XR (EFFEXOR XR) 75 MG 24 hr capsule Take 1 capsule (75 mg total) by mouth daily with breakfast.   venlafaxine XR (EFFEXOR-XR) 150 MG 24 hr capsule TAKE 1 CAPSULE(150 MG) BY MOUTH DAILY WITH BREAKFAST   vitamin B-12  (CYANOCOBALAMIN) 1000 MCG tablet Take 1,000 mcg by mouth daily.   zolpidem (AMBIEN) 10 MG tablet TAKE 1 TABLET BY MOUTH AT BEDTIME   No facility-administered encounter medications on file as of 11/21/2020.    Contacted DANICKA HOURIHAN for general disease state and medication adherence call.   Patient is not > 5 days past due for refill on the following medications per chart history:  [facility] Medications: Medication Name/mg Last Fill Days Supply Rosuvastatin 20 mg   11/13/20 90ds   What concerns do you have about your medications? Pt stated she has no concerns in regards to meds  The patient denies side effects with her medications.   How often do you forget or accidentally miss a dose? Never  Do you use a pillbox? Yes  Are you having any problems getting your medications from your pharmacy? No  Has the cost of your medications been a concern? No  Since last visit with CPP, no interventions have been made:   The patient has not had an ED visit since last contact.   The patient denies problems with their health.   Pt stated she just lost her daughter so they have been dealing with that and other than that they stated her and her husband have no concerns and are doing well   Care Gaps: Last annual wellness visit? Scheduled 12/04/20 If applicable: N/A Last eye exam / retinopathy screening? Diabetic foot exam?  Elray Mcgregor, [place] Pharmacist Assistant  [phone]

## 2020-12-04 ENCOUNTER — Ambulatory Visit: Payer: PPO | Admitting: Family Medicine

## 2020-12-04 DIAGNOSIS — H353130 Nonexudative age-related macular degeneration, bilateral, stage unspecified: Secondary | ICD-10-CM | POA: Diagnosis not present

## 2020-12-04 DIAGNOSIS — H16223 Keratoconjunctivitis sicca, not specified as Sjogren's, bilateral: Secondary | ICD-10-CM | POA: Diagnosis not present

## 2020-12-04 DIAGNOSIS — H26493 Other secondary cataract, bilateral: Secondary | ICD-10-CM | POA: Diagnosis not present

## 2020-12-05 ENCOUNTER — Ambulatory Visit (INDEPENDENT_AMBULATORY_CARE_PROVIDER_SITE_OTHER): Payer: PPO | Admitting: Nurse Practitioner

## 2020-12-05 ENCOUNTER — Other Ambulatory Visit: Payer: Self-pay | Admitting: Nurse Practitioner

## 2020-12-05 ENCOUNTER — Encounter: Payer: Self-pay | Admitting: Nurse Practitioner

## 2020-12-05 VITALS — BP 142/62 | HR 90 | Temp 97.9°F | Ht 66.0 in | Wt 154.0 lb

## 2020-12-05 DIAGNOSIS — J101 Influenza due to other identified influenza virus with other respiratory manifestations: Secondary | ICD-10-CM | POA: Diagnosis not present

## 2020-12-05 DIAGNOSIS — F411 Generalized anxiety disorder: Secondary | ICD-10-CM

## 2020-12-05 DIAGNOSIS — R051 Acute cough: Secondary | ICD-10-CM

## 2020-12-05 LAB — POC COVID19 BINAXNOW: SARS Coronavirus 2 Ag: NEGATIVE

## 2020-12-05 LAB — POCT INFLUENZA A/B
Influenza A, POC: POSITIVE — AB
Influenza B, POC: NEGATIVE

## 2020-12-05 MED ORDER — OSELTAMIVIR PHOSPHATE 75 MG PO CAPS
75.0000 mg | ORAL_CAPSULE | Freq: Two times a day (BID) | ORAL | 0 refills | Status: DC
Start: 2020-12-05 — End: 2020-12-22

## 2020-12-05 MED ORDER — VENLAFAXINE HCL ER 150 MG PO CP24: ORAL_CAPSULE | ORAL | 1 refills | Status: DC

## 2020-12-05 NOTE — Progress Notes (Addendum)
Acute Office Visit  Subjective:    Patient ID: Virginia Montoya, female    DOB: 09-05-1950, 70 y.o.   MRN: [id number]  CC cough   HPI Patient is in today for sinus congestion, sore throat, headache, and cough.She is accompanied by her spouse. Onset of symptoms was two days ago. Treatment of symptoms has been Tylenol. She has not been around any ill-contacts knowingly. Unfortunately, she had to bury her adult daughter that recently died from cancer. Pt is tearful and crying intermittently.  Past Medical History:  Diagnosis Date   Chronic insomnia    Depression    Gastroesophageal reflux disease    Hyperlipidemia    Osteoporosis    Prediabetes     Past Surgical History:  Procedure Laterality Date   CHOLECYSTECTOMY  1999   CORONARY CALCIUM SCORE/CORONARY CTA  01/2017   Coronary calcium score is 0.  Normal coronary origin with normal right dominance.  No evidence of CAD.  Most likely noncardiac chest pain.   SHOULDER SURGERY  2005   TONSILLECTOMY     TUBAL LIGATION  1977    Family History  Problem Relation Age of Onset   Diabetes Mother    Hyperlipidemia Mother    Emphysema Father    Diabetes Maternal Grandmother    Liver cancer Maternal Grandmother     Social History   Socioeconomic History   Marital status: Married    Spouse name: Not on file   Number of children: 3   Years of education: 12   Highest education level: Associate degree: academic program  Occupational History   Occupation: Retired  Tobacco Use   Smoking status: Never   Smokeless tobacco: Never  Substance and Sexual Activity   Alcohol use: No   Drug use: No   Sexual activity: Not Currently  Other Topics Concern   Not on file  Social History Narrative   She is a married (remarried) mother of 3 with 4 grandchildren.  She has 3 great-grandchildren.  She previously worked at [employer] in [place].  She completed high school and beauty school.   She works outside a lot in the yard on  gardening.  She does a lot of walking and exercises with walking for at least a half an hour a day 5 days a week.   wears sunscreen, brushes and flosses daily, see's dentist bi-annually, has smoke/carbon monoxide detectors, wears a seatbelt and practices gun safety   Social Determinants of Health   Financial Resource Strain: Not on file  Food Insecurity: No Food Insecurity   Worried About Charity fundraiser in the Last Year: Never true   Ran Out of Food in the Last Year: Never true  Transportation Needs: No Transportation Needs   Lack of Transportation (Medical): No   Lack of Transportation (Non-Medical): No  Physical Activity: Not on file  Stress: Not on file  Social Connections: Not on file  Intimate Partner Violence: Not on file    Outpatient Medications Prior to Visit  Medication Sig Dispense Refill   acetaminophen (TYLENOL) 500 MG tablet Take 500 mg every 6 (six) hours as needed by mouth.     cholecalciferol (VITAMIN D3) 25 MCG (1000 UNIT) tablet Take 1,000 Units by mouth 2 (two) times daily.     D-Mannose 500 MG CAPS Take 500 mg by mouth daily.     famotidine (PEPCID) 20 MG tablet Take 1 tablet (20 mg total) by mouth 2 (two) times daily. 180 tablet 1  LORazepam (ATIVAN) 0.5 MG tablet TAKE 1 TABLET(0.5 MG) BY MOUTH EVERY 8 HOURS AS NEEDED FOR ANXIETY 90 tablet 0   Multiple Vitamins-Minerals (PRESERVISION AREDS 2 PO) Take by mouth.     omeprazole (PRILOSEC) 20 MG capsule TAKE 1 CAPSULE(20 MG) BY MOUTH DAILY 90 capsule 3   pyridOXINE (VITAMIN B-6) 100 MG tablet Take 100 mg by mouth daily.     rosuvastatin (CRESTOR) 20 MG tablet One at night 90 tablet 0   venlafaxine XR (EFFEXOR-XR) 150 MG 24 hr capsule TAKE 1 CAPSULE(150 MG) BY MOUTH DAILY WITH BREAKFAST 90 capsule 1   vitamin B-12 (CYANOCOBALAMIN) 1000 MCG tablet Take 1,000 mcg by mouth daily.     zolpidem (AMBIEN) 10 MG tablet TAKE 1 TABLET BY MOUTH AT BEDTIME 30 tablet 5   No facility-administered medications prior to visit.     Allergies  Allergen Reactions   Cephalexin Nausea And Vomiting    ask   Alendronate     Gi upset   Biaxin [Clarithromycin] Other (See Comments)    CHEST PAIN   Sulfa Antibiotics     Made her feel poorly.     Review of Systems  Constitutional:  Positive for fatigue. Negative for appetite change, chills, fever and unexpected weight change.  HENT:  Positive for congestion, ear pain (left ear), postnasal drip, rhinorrhea, sinus pressure, sinus pain and sore throat. Negative for tinnitus.   Eyes:  Negative for pain.  Respiratory:  Positive for cough. Negative for shortness of breath.   Cardiovascular:  Negative for chest pain, palpitations and leg swelling.  Gastrointestinal:  Negative for abdominal pain, constipation, diarrhea, nausea and vomiting.  Endocrine: Negative for cold intolerance, heat intolerance, polydipsia, polyphagia and polyuria.  Genitourinary:  Negative for dysuria, frequency and hematuria.  Musculoskeletal:  Negative for arthralgias, back pain, joint swelling and myalgias.  Skin:  Negative for rash.  Allergic/Immunologic: Negative for environmental allergies.  Neurological:  Positive for headaches. Negative for dizziness.  Hematological:  Negative for adenopathy.  Psychiatric/Behavioral:  Negative for decreased concentration and sleep disturbance. The patient is not nervous/anxious.       Objective:    Physical Exam Vitals reviewed.  Constitutional:      Appearance: Normal appearance.  HENT:     Head: Normocephalic.     Right Ear: Tympanic membrane normal.     Left Ear: Tympanic membrane normal.     Nose: Congestion and rhinorrhea present.     Mouth/Throat:     Mouth: Mucous membranes are moist.     Pharynx: Posterior oropharyngeal erythema present.  Cardiovascular:     Rate and Rhythm: Normal rate and regular rhythm.     Pulses: Normal pulses.     Heart sounds: Normal heart sounds.  Pulmonary:     Effort: Pulmonary effort is normal.     Breath  sounds: Normal breath sounds.  Abdominal:     General: Bowel sounds are normal.     Palpations: Abdomen is soft.  Musculoskeletal:        General: Normal range of motion.     Cervical back: Neck supple.  Skin:    General: Skin is warm and dry.     Capillary Refill: Capillary refill takes less than 2 seconds.  Neurological:     General: No focal deficit present.     Mental Status: She is alert and oriented to person, place, and time.  Psychiatric:     Comments: Tearful, crying intermittently   BP (!) 142/62 (BP Location: Right  Arm, Patient Position: Sitting)   Pulse 90   Temp 97.9 F (36.6 C) (Temporal)   Ht _0  (1.676 m)   Wt 154 lb (69.9 kg)   SpO2 99%   BMI 24.86 kg/m   Wt Readings from Last 3 Encounters:  11/10/20 154 lb (69.9 kg)  10/10/20 153 lb (69.4 kg)  10/01/20 160 lb (72.6 kg)    Health Maintenance Due  Topic Date Due   Hepatitis C Screening  Never done   Zoster Vaccines- Shingrix (2 of 2) 12/28/2016   Pneumonia Vaccine 68+ Years old (3 - PCV) 10/12/2017   Fecal DNA (Cologuard)  05/27/2019   COVID-19 Vaccine (4 - Booster for Pfizer series) 01/31/2020    Lab Results  Component Value Date   TSH 2.510 03/06/2019   Lab Results  Component Value Date   WBC 5.6 09/05/2020   HGB 14.3 09/05/2020   HCT 42.1 09/05/2020   MCV 88 09/05/2020   PLT 320 09/05/2020   Lab Results  Component Value Date   NA 140 09/05/2020   K 4.5 09/05/2020   CO2 22 09/05/2020   GLUCOSE 118 (H) 09/05/2020   BUN 12 09/05/2020   CREATININE 0.65 09/05/2020   BILITOT 0.5 09/05/2020   ALKPHOS 83 09/05/2020   AST 19 09/05/2020   ALT 19 09/05/2020   PROT 6.9 09/05/2020   ALBUMIN 4.7 09/05/2020   CALCIUM 9.5 09/05/2020   EGFR 94 09/05/2020   Lab Results  Component Value Date   CHOL 143 09/05/2020   Lab Results  Component Value Date   HDL 58 09/05/2020   Lab Results  Component Value Date   LDLCALC 65 09/05/2020   Lab Results  Component Value Date   TRIG 112  09/05/2020   Lab Results  Component Value Date   CHOLHDL 2.5 09/05/2020   Lab Results  Component Value Date   HGBA1C 6.2 (H) 09/05/2020       Assessment & Plan:   1. Influenza A - oseltamivir (TAMIFLU) 75 MG capsule; Take 1 capsule (75 mg total) by mouth 2 (two) times daily.  Dispense: 10 capsule; Refill: 0  2. Acute cough - POC COVID-19 BinaxNow-NEGATIVE - POCT Influenza A/B-INFLUENZA A-POSITIVE, INFLUENZA B-NEGATIVE   Rest and push fluids Continue Tylenol as directed  Take Tamiflu twice daily for 5 days   Follow-up: PRN  I, Rip Harbour, NP, have reviewed all documentation for this visit. The documentation on 12/05/20 for the exam, diagnosis, procedures, and orders are all accurate and complete.   Signed, Rip Harbour, NP

## 2020-12-05 NOTE — Patient Instructions (Signed)
Rest and push fluids Continue Tylenol as directed  Take Tamiflu twice daily for 5 days   Influenza, Adult Influenza is also called "the flu." It is an infection in the lungs, nose, and throat (respiratory tract). It spreads easily from person to person (is contagious). The flu causes symptoms that are like a cold, along with high fever and body aches. What are the causes? This condition is caused by the influenza virus. You can get the virus by: Breathing in droplets that are in the air after a person infected with the flu coughed or sneezed. Touching something that has the virus on it and then touching your mouth, nose, or eyes. What increases the risk? Certain things may make you more likely to get the flu. These include: Not washing your hands often. Having close contact with many people during cold and flu season. Touching your mouth, eyes, or nose without first washing your hands. Not getting a flu shot every year. You may have a higher risk for the flu, and serious problems, such as a lung infection (pneumonia), if you: Are older than 65. Are pregnant. Have a weakened disease-fighting system (immune system) because of a disease or because you are taking certain medicines. Have a long-term (chronic) condition, such as: Heart, kidney, or lung disease. Diabetes. Asthma. Have a liver disorder. Are very overweight (morbidly obese). Have anemia. What are the signs or symptoms? Symptoms usually begin suddenly and last 4-14 days. They may include: Fever and chills. Headaches, body aches, or muscle aches. Sore throat. Cough. Runny or stuffy (congested) nose. Feeling discomfort in your chest. Not wanting to eat as much as normal. Feeling weak or tired. Feeling dizzy. Feeling sick to your stomach or throwing up. How is this treated? If the flu is found early, you can be treated with antiviral medicine. This can help to reduce how bad the illness is and how long it lasts. This may  be given by mouth or through an IV tube. Taking care of yourself at home can help your symptoms get better. Your doctor may want you to: Take over-the-counter medicines. Drink plenty of fluids. The flu often goes away on its own. If you have very bad symptoms or other problems, you may be treated in a hospital. Follow these instructions at home:   Activity Rest as needed. Get plenty of sleep. Stay home from work or school as told by your doctor. Do not leave home until you do not have a fever for 24 hours without taking medicine. Leave home only to go to your doctor. Eating and drinking Take an ORS (oral rehydration solution). This is a drink that is sold at pharmacies and stores. Drink enough fluid to keep your pee pale yellow. Drink clear fluids in small amounts as you are able. Clear fluids include: Water. Ice chips. Fruit juice mixed with water. Low-calorie sports drinks. Eat bland foods that are easy to digest. Eat small amounts as you are able. These foods include: Bananas. Applesauce. Rice. Lean meats. Toast. Crackers. Do not eat or drink: Fluids that have a lot of sugar or caffeine. Alcohol. Spicy or fatty foods. General instructions Take over-the-counter and prescription medicines only as told by your doctor. Use a cool mist humidifier to add moisture to the air in your home. This can make it easier for you to breathe. When using a cool mist humidifier, clean it daily. Empty water and replace with clean water. Cover your mouth and nose when you cough or sneeze. Wash  your hands with soap and water often and for at least 20 seconds. This is also important after you cough or sneeze. If you cannot use soap and water, use alcohol-based hand sanitizer. Keep all follow-up visits. How is this prevented?  Get a flu shot every year. You may get the flu shot in late summer, fall, or winter. Ask your doctor when you should get your flu shot. Avoid contact with people who are  sick during fall and winter. This is cold and flu season. Contact a doctor if: You get new symptoms. You have: Chest pain. Watery poop (diarrhea). A fever. Your cough gets worse. You start to have more mucus. You feel sick to your stomach. You throw up. Get help right away if you: Have shortness of breath. Have trouble breathing. Have skin or nails that turn a bluish color. Have very bad pain or stiffness in your neck. Get a sudden headache. Get sudden pain in your face or ear. Cannot eat or drink without throwing up. These symptoms may represent a serious problem that is an emergency. Get medical help right away. Call your local emergency services (911 in the [place] Do not wait to see if the symptoms will go away. Do not drive yourself to the hospital. Summary Influenza is also called "the flu." It is an infection in the lungs, nose, and throat. It spreads easily from person to person. Take over-the-counter and prescription medicines only as told by your doctor. Getting a flu shot every year is the best way to not get the flu. This information is not intended to replace advice given to you by your health care provider. Make sure you discuss any questions you have with your health care provider. Document Revised: 08/10/2019 Document Reviewed: 08/10/2019 Elsevier Patient Education  2022 [employer].

## 2020-12-17 ENCOUNTER — Other Ambulatory Visit: Payer: Self-pay | Admitting: Nurse Practitioner

## 2020-12-17 ENCOUNTER — Ambulatory Visit: Payer: PPO | Admitting: Family Medicine

## 2020-12-17 DIAGNOSIS — F411 Generalized anxiety disorder: Secondary | ICD-10-CM

## 2020-12-22 ENCOUNTER — Encounter: Payer: Self-pay | Admitting: Physician Assistant

## 2020-12-22 ENCOUNTER — Ambulatory Visit (INDEPENDENT_AMBULATORY_CARE_PROVIDER_SITE_OTHER): Payer: PPO | Admitting: Physician Assistant

## 2020-12-22 VITALS — BP 130/80 | HR 102 | Temp 96.3°F | Ht 66.0 in | Wt 156.8 lb

## 2020-12-22 DIAGNOSIS — I889 Nonspecific lymphadenitis, unspecified: Secondary | ICD-10-CM

## 2020-12-22 DIAGNOSIS — J060 Acute laryngopharyngitis: Secondary | ICD-10-CM | POA: Diagnosis not present

## 2020-12-22 LAB — POCT RAPID STREP A (OFFICE): Rapid Strep A Screen: NEGATIVE

## 2020-12-22 LAB — POCT INFLUENZA A/B
Influenza A, POC: NEGATIVE
Influenza B, POC: NEGATIVE

## 2020-12-22 LAB — POC COVID19 BINAXNOW: SARS Coronavirus 2 Ag: NEGATIVE

## 2020-12-22 MED ORDER — AMOXICILLIN-POT CLAVULANATE 875-125 MG PO TABS: 1.0000 | ORAL_TABLET | Freq: Two times a day (BID) | ORAL | 0 refills | Status: DC

## 2020-12-22 NOTE — Progress Notes (Signed)
Acute Office Visit  Subjective:    Patient ID: Virginia Montoya, female    DOB: 01/16/1950, 70 y.o.   MRN: [id number]  Chief Complaint  Patient presents with   Sore Throat    HPI: Patient is in today for complaints of sore throat and 'swollen glands' for the past few days - she actually took some of her husbands augmentin over the weekend which helped her symptoms Complains of soreness with swallowing Has been exposed to flu but she has not had bodyaches or fever  Past Medical History:  Diagnosis Date   Chronic insomnia    Depression    Gastroesophageal reflux disease    Hyperlipidemia    Osteoporosis    Prediabetes     Past Surgical History:  Procedure Laterality Date   CHOLECYSTECTOMY  1999   CORONARY CALCIUM SCORE/CORONARY CTA  01/2017   Coronary calcium score is 0.  Normal coronary origin with normal right dominance.  No evidence of CAD.  Most likely noncardiac chest pain.   SHOULDER SURGERY  2005   TONSILLECTOMY     TUBAL LIGATION  1977    Family History  Problem Relation Age of Onset   Diabetes Mother    Hyperlipidemia Mother    Emphysema Father    Diabetes Maternal Grandmother    Liver cancer Maternal Grandmother     Social History   Socioeconomic History   Marital status: Married    Spouse name: Not on file   Number of children: 3   Years of education: 12   Highest education level: Associate degree: academic program  Occupational History   Occupation: Retired  Tobacco Use   Smoking status: Never   Smokeless tobacco: Never  Substance and Sexual Activity   Alcohol use: No   Drug use: No   Sexual activity: Not Currently  Other Topics Concern   Not on file  Social History Narrative   She is a married (remarried) mother of 3 with 4 grandchildren.  She has 3 great-grandchildren.  She previously worked at [employer] in [place].  She completed high school and beauty school.   She works outside a lot in the yard on gardening.  She does a lot of  walking and exercises with walking for at least a half an hour a day 5 days a week.   wears sunscreen, brushes and flosses daily, see's dentist bi-annually, has smoke/carbon monoxide detectors, wears a seatbelt and practices gun safety   Social Determinants of Health   Financial Resource Strain: Not on file  Food Insecurity: No Food Insecurity   Worried About Charity fundraiser in the Last Year: Never true   Ran Out of Food in the Last Year: Never true  Transportation Needs: No Transportation Needs   Lack of Transportation (Medical): No   Lack of Transportation (Non-Medical): No  Physical Activity: Not on file  Stress: Not on file  Social Connections: Not on file  Intimate Partner Violence: Not on file    Outpatient Medications Prior to Visit  Medication Sig Dispense Refill   acetaminophen (TYLENOL) 500 MG tablet Take 500 mg every 6 (six) hours as needed by mouth.     cholecalciferol (VITAMIN D3) 25 MCG (1000 UNIT) tablet Take 1,000 Units by mouth 2 (two) times daily.     D-Mannose 500 MG CAPS Take 500 mg by mouth daily.     famotidine (PEPCID) 20 MG tablet Take 1 tablet (20 mg total) by mouth 2 (two) times daily.  180 tablet 1   LORazepam (ATIVAN) 0.5 MG tablet TAKE 1 TABLET(0.5 MG) BY MOUTH EVERY 8 HOURS AS NEEDED FOR ANXIETY 90 tablet 0   Multiple Vitamins-Minerals (PRESERVISION AREDS 2 PO) Take by mouth.     omeprazole (PRILOSEC) 20 MG capsule TAKE 1 CAPSULE(20 MG) BY MOUTH DAILY 90 capsule 3   pyridOXINE (VITAMIN B-6) 100 MG tablet Take 100 mg by mouth daily.     rosuvastatin (CRESTOR) 20 MG tablet One at night 90 tablet 0   venlafaxine XR (EFFEXOR-XR) 150 MG 24 hr capsule TAKE 1 CAPSULE(150 MG) BY MOUTH DAILY WITH BREAKFAST 90 capsule 1   vitamin B-12 (CYANOCOBALAMIN) 1000 MCG tablet Take 1,000 mcg by mouth daily.     zolpidem (AMBIEN) 10 MG tablet TAKE 1 TABLET BY MOUTH AT BEDTIME 30 tablet 5   oseltamivir (TAMIFLU) 75 MG capsule Take 1 capsule (75 mg total) by mouth 2 (two)  times daily. 10 capsule 0   No facility-administered medications prior to visit.    Allergies  Allergen Reactions   Cephalexin Nausea And Vomiting    ask   Alendronate     Gi upset   Biaxin [Clarithromycin] Other (See Comments)    CHEST PAIN   Sulfa Antibiotics     Made her feel poorly.     Review of Systems CONSTITUTIONAL: Negative for chills, fatigue, fever, unintentional weight gain and unintentional weight loss.  E/N/T: see HPI CARDIOVASCULAR: Negative for chest pain, dizziness, palpitations and pedal edema.  RESPIRATORY: Negative for recent cough and dyspnea.  GASTROINTESTINAL: Negative for abdominal pain, acid reflux symptoms, constipation, diarrhea, nausea and vomiting.          Objective:    Physical Exam PHYSICAL EXAM:   VS: BP 130/80 (BP Location: Left Arm, Patient Position: Sitting, Cuff Size: Normal)    Pulse (!) 102    Temp (!) 96.3 F (35.7 C) (Temporal)    Ht $R'5\' 6"'GV$  (1.676 m)    Wt 156 lb 12.8 oz (71.1 kg)    SpO2 97%    BMI 25.31 kg/m   GEN: Well nourished, well developed, in no acute distress  HEENT: normal external ears and nose - normal external auditory canals and TMS -  - Lips, Teeth and Gums - normal  Oropharynx - erythema noted Neck: tender to palpation - mild lymphadenopathy noted on left side of throat and more pronounced on right side of throat Cardiac: RRR; no murmurs, Respiratory:  normal respiratory rate and pattern with no distress - normal breath sounds with no rales, rhonchi, wheezes or rubs  Office Visit on 12/22/2020  Component Date Value Ref Range Status   SARS Coronavirus 2 Ag 12/22/2020 Negative  Negative Final   Influenza A, POC 12/22/2020 Negative  Negative Final   Influenza B, POC 12/22/2020 Negative  Negative Final   Rapid Strep A Screen 12/22/2020 Negative  Negative Final      BP 130/80 (BP Location: Left Arm, Patient Position: Sitting, Cuff Size: Normal)    Pulse (!) 102    Temp (!) 96.3 F (35.7 C) (Temporal)    Ht $R'5\' 6"'ME$   (1.676 m)    Wt 156 lb 12.8 oz (71.1 kg)    SpO2 97%    BMI 25.31 kg/m  Wt Readings from Last 3 Encounters:  12/22/20 156 lb 12.8 oz (71.1 kg)  12/05/20 154 lb (69.9 kg)  11/10/20 154 lb (69.9 kg)    Health Maintenance Due  Topic Date Due   Hepatitis C Screening  Never  done   Zoster Vaccines- Shingrix (2 of 2) 12/28/2016   Pneumonia Vaccine 20+ Years old (3 - PCV) 10/12/2017   Fecal DNA (Cologuard)  05/27/2019   COVID-19 Vaccine (4 - Booster for Pfizer series) 01/31/2020    There are no preventive care reminders to display for this patient.   Lab Results  Component Value Date   TSH 2.510 03/06/2019   Lab Results  Component Value Date   WBC 5.6 09/05/2020   HGB 14.3 09/05/2020   HCT 42.1 09/05/2020   MCV 88 09/05/2020   PLT 320 09/05/2020   Lab Results  Component Value Date   NA 140 09/05/2020   K 4.5 09/05/2020   CO2 22 09/05/2020   GLUCOSE 118 (H) 09/05/2020   BUN 12 09/05/2020   CREATININE 0.65 09/05/2020   BILITOT 0.5 09/05/2020   ALKPHOS 83 09/05/2020   AST 19 09/05/2020   ALT 19 09/05/2020   PROT 6.9 09/05/2020   ALBUMIN 4.7 09/05/2020   CALCIUM 9.5 09/05/2020   EGFR 94 09/05/2020   Lab Results  Component Value Date   CHOL 143 09/05/2020   Lab Results  Component Value Date   HDL 58 09/05/2020   Lab Results  Component Value Date   LDLCALC 65 09/05/2020   Lab Results  Component Value Date   TRIG 112 09/05/2020   Lab Results  Component Value Date   CHOLHDL 2.5 09/05/2020   Lab Results  Component Value Date   HGBA1C 6.2 (H) 09/05/2020       Assessment & Plan:   Problem List Items Addressed This Visit   None Visit Diagnoses     Acute laryngopharyngitis    -  Primary   Relevant Medications   amoxicillin-clavulanate (AUGMENTIN) 875-125 MG tablet   Other Relevant Orders   POC COVID-19 BinaxNow (Completed)   POCT Influenza A/B (Completed)   POCT rapid strep A (Completed)   Lymphadenitis       Relevant Medications    amoxicillin-clavulanate (AUGMENTIN) 875-125 MG tablet      Meds ordered this encounter  Medications   amoxicillin-clavulanate (AUGMENTIN) 875-125 MG tablet    Sig: Take 1 tablet by mouth 2 (two) times daily.    Dispense:  20 tablet    Refill:  0    Order Specific Question:   Supervising Provider    AnswerShelton Silvas    Orders Placed This Encounter  Procedures   POC COVID-19 BinaxNow   POCT Influenza A/B   POCT rapid strep A     Follow-up: Return if symptoms worsen or fail to improve. Recommend advil as needed An After Visit Summary was printed and given to the patient.  Yetta Flock [facility] [phone]

## 2020-12-23 ENCOUNTER — Encounter: Payer: PPO | Admitting: Family Medicine

## 2020-12-25 ENCOUNTER — Other Ambulatory Visit: Payer: Self-pay | Admitting: Family Medicine

## 2021-01-05 ENCOUNTER — Encounter: Payer: Self-pay | Admitting: Family Medicine

## 2021-01-05 DIAGNOSIS — K219 Gastro-esophageal reflux disease without esophagitis: Secondary | ICD-10-CM | POA: Insufficient documentation

## 2021-01-05 NOTE — Progress Notes (Signed)
Subjective:  Patient ID: Virginia Montoya, female    DOB: [birth year]  Age: 71 y.o. MRN: [id number]  No chief complaint on file.   HPI   Current Outpatient Medications on File Prior to Visit  Medication Sig Dispense Refill   acetaminophen (TYLENOL) 500 MG tablet Take 500 mg every 6 (six) hours as needed by mouth.     amoxicillin-clavulanate (AUGMENTIN) 875-125 MG tablet Take 1 tablet by mouth 2 (two) times daily. 20 tablet 0   cholecalciferol (VITAMIN D3) 25 MCG (1000 UNIT) tablet Take 1,000 Units by mouth 2 (two) times daily.     D-Mannose 500 MG CAPS Take 500 mg by mouth daily.     famotidine (PEPCID) 20 MG tablet Take 1 tablet (20 mg total) by mouth 2 (two) times daily. 180 tablet 1   LORazepam (ATIVAN) 0.5 MG tablet TAKE 1 TABLET(0.5 MG) BY MOUTH EVERY 8 HOURS AS NEEDED FOR ANXIETY 90 tablet 0   Multiple Vitamins-Minerals (PRESERVISION AREDS 2 PO) Take by mouth.     omeprazole (PRILOSEC) 20 MG capsule TAKE 1 CAPSULE(20 MG) BY MOUTH DAILY 90 capsule 3   pyridOXINE (VITAMIN B-6) 100 MG tablet Take 100 mg by mouth daily.     rosuvastatin (CRESTOR) 20 MG tablet One at night 90 tablet 0   venlafaxine XR (EFFEXOR-XR) 150 MG 24 hr capsule TAKE 1 CAPSULE(150 MG) BY MOUTH DAILY WITH BREAKFAST 90 capsule 1   vitamin B-12 (CYANOCOBALAMIN) 1000 MCG tablet Take 1,000 mcg by mouth daily.     zolpidem (AMBIEN) 10 MG tablet TAKE 1 TABLET BY MOUTH AT BEDTIME 30 tablet 5   No current facility-administered medications on file prior to visit.   Past Medical History:  Diagnosis Date   Chronic insomnia    Depression    Gastroesophageal reflux disease    Hyperlipidemia    Osteoporosis    Precordial chest pain 11/17/2016   Prediabetes    Past Surgical History:  Procedure Laterality Date   CHOLECYSTECTOMY  1999   CORONARY CALCIUM SCORE/CORONARY CTA  01/2017   Coronary calcium score is 0.  Normal coronary origin with normal right dominance.  No evidence of CAD.  Most likely noncardiac chest pain.    SHOULDER SURGERY  2005   TONSILLECTOMY     TUBAL LIGATION  1977    Family History  Problem Relation Age of Onset   Diabetes Mother    Hyperlipidemia Mother    Emphysema Father    Diabetes Maternal Grandmother    Liver cancer Maternal Grandmother    Social History   Socioeconomic History   Marital status: Married    Spouse name: Not on file   Number of children: 3   Years of education: 12   Highest education level: Associate degree: academic program  Occupational History   Occupation: Retired  Tobacco Use   Smoking status: Never   Smokeless tobacco: Never  Substance and Sexual Activity   Alcohol use: No   Drug use: No   Sexual activity: Not Currently  Other Topics Concern   Not on file  Social History Narrative   She is a married (remarried) mother of 3 with 4 grandchildren.  She has 3 great-grandchildren.  She previously worked at [employer] in the [employer].  She completed high school and beauty school.   She works outside a lot in the yard on gardening.  She does a lot of walking and exercises with walking for at least a half an hour a day 5 days  a week.   wears sunscreen, brushes and flosses daily, see's dentist bi-annually, has smoke/carbon monoxide detectors, wears a seatbelt and practices gun safety   Social Determinants of Health   Financial Resource Strain: Not on file  Food Insecurity: No Food Insecurity   Worried About Programme researcher, broadcasting/film/video in the Last Year: Never true   Ran Out of Food in the Last Year: Never true  Transportation Needs: No Transportation Needs   Lack of Transportation (Medical): No   Lack of Transportation (Non-Medical): No  Physical Activity: Not on file  Stress: Not on file  Social Connections: Not on file    Review of Systems  Constitutional:  Negative for chills, fatigue and fever.  HENT:  Negative for congestion, ear pain and sore throat.   Respiratory:  Negative for cough and shortness of breath.   Cardiovascular:  Negative for  chest pain and palpitations.  Gastrointestinal:  Negative for abdominal pain, constipation, diarrhea, nausea and vomiting.  Endocrine: Negative for polydipsia, polyphagia and polyuria.  Genitourinary:  Negative for difficulty urinating and dysuria.  Musculoskeletal:  Negative for arthralgias, back pain and myalgias.  Skin:  Negative for rash.  Neurological:  Negative for headaches.  Psychiatric/Behavioral:  Negative for dysphoric mood. The patient is not nervous/anxious.     Objective:  There were no vitals taken for this visit.  BP/Weight 12/22/2020 12/05/2020 11/10/2020  Systolic BP 130 142 136  Diastolic BP 80 62 82  Wt. (Lbs) 156.8 154 154  BMI 25.31 24.86 24.86    Physical Exam  Diabetic Foot Exam - Simple   No data filed      Lab Results  Component Value Date   WBC 5.6 09/05/2020   HGB 14.3 09/05/2020   HCT 42.1 09/05/2020   PLT 320 09/05/2020   GLUCOSE 118 (H) 09/05/2020   CHOL 143 09/05/2020   TRIG 112 09/05/2020   HDL 58 09/05/2020   LDLCALC 65 09/05/2020   ALT 19 09/05/2020   AST 19 09/05/2020   NA 140 09/05/2020   K 4.5 09/05/2020   CL 103 09/05/2020   CREATININE 0.65 09/05/2020   BUN 12 09/05/2020   CO2 22 09/05/2020   TSH 2.510 03/06/2019   HGBA1C 6.2 (H) 09/05/2020   MICROALBUR 10 05/29/2020      Assessment & Plan:   Problem List Items Addressed This Visit       Digestive   Gastroesophageal reflux disease     Nervous and Auditory   Idiopathic peripheral neuropathy     Musculoskeletal and Integument   Age-related osteoporosis without current pathological fracture     Other   Mixed hyperlipidemia   Prediabetes - Primary   Moderate recurrent major depression (HCC)   GAD (generalized anxiety disorder)  .  No orders of the defined types were placed in this encounter.   No orders of the defined types were placed in this encounter.    Follow-up: No follow-ups on file.  An After Visit Summary was printed and given to the  patient.  Blane Ohara, MD [facility] [phone]

## 2021-01-06 ENCOUNTER — Other Ambulatory Visit: Payer: Self-pay

## 2021-01-06 ENCOUNTER — Encounter: Payer: Self-pay | Admitting: Family Medicine

## 2021-01-06 ENCOUNTER — Ambulatory Visit (INDEPENDENT_AMBULATORY_CARE_PROVIDER_SITE_OTHER): Payer: PPO | Admitting: Family Medicine

## 2021-01-06 VITALS — BP 130/82 | HR 84 | Temp 97.8°F | Resp 16 | Ht 66.0 in | Wt 159.0 lb

## 2021-01-06 DIAGNOSIS — F331 Major depressive disorder, recurrent, moderate: Secondary | ICD-10-CM | POA: Diagnosis not present

## 2021-01-06 DIAGNOSIS — F411 Generalized anxiety disorder: Secondary | ICD-10-CM

## 2021-01-06 DIAGNOSIS — K219 Gastro-esophageal reflux disease without esophagitis: Secondary | ICD-10-CM | POA: Diagnosis not present

## 2021-01-06 DIAGNOSIS — H353132 Nonexudative age-related macular degeneration, bilateral, intermediate dry stage: Secondary | ICD-10-CM

## 2021-01-06 DIAGNOSIS — E782 Mixed hyperlipidemia: Secondary | ICD-10-CM

## 2021-01-06 DIAGNOSIS — G609 Hereditary and idiopathic neuropathy, unspecified: Secondary | ICD-10-CM

## 2021-01-06 DIAGNOSIS — R7303 Prediabetes: Secondary | ICD-10-CM | POA: Diagnosis not present

## 2021-01-06 DIAGNOSIS — F330 Major depressive disorder, recurrent, mild: Secondary | ICD-10-CM

## 2021-01-06 DIAGNOSIS — M810 Age-related osteoporosis without current pathological fracture: Secondary | ICD-10-CM

## 2021-01-06 DIAGNOSIS — H3530 Unspecified macular degeneration: Secondary | ICD-10-CM | POA: Insufficient documentation

## 2021-01-06 NOTE — Assessment & Plan Note (Signed)
New dx. Scheduled to see [facility]

## 2021-01-06 NOTE — Assessment & Plan Note (Signed)
Recommend continue to work on eating healthy diet and exercise.  

## 2021-01-06 NOTE — Assessment & Plan Note (Signed)
>>  ASSESSMENT AND PLAN FOR MILD RECURRENT MAJOR DEPRESSION (HCC) WRITTEN ON 01/06/2021 10:50 AM BY Javeon Macmurray, MD  The current medical regimen is effective;  continue present plan and medications.

## 2021-01-06 NOTE — Progress Notes (Signed)
Subjective:  Patient ID: Virginia Montoya, female    DOB: 02/21/1950  Age: 71 y.o. MRN: [id number]  Chief Complaint  Patient presents with   Hyperlipidemia   Gastroesophageal Reflux    HPI Occasional flare ups of back pain. Has idiopathic neuropathy.  Requesting handicap placard.  Osteoporosis. Vitamin D 1000 U qd. Pt is eating cheese. Minimal milk except with cereal. Unable to take calcium due to constipation.  GAD/Depression: on effexor xr 150 mg cone daily in am. Lorazepam 1/2 pill in am and 1/2 in afternoon, 1 in evening. Takes ambien at night for sleep.  Prediabetes: Tries to eat healthy. Not exercising.  Hyperlipidemia. On crestor 20 mg daily. Eating healthy. Some exercise.  GERD. On omeprazole 20 mg daily and pepcid 20 mg daily.  New diagnosis of macular degeneration.  Current Outpatient Medications on File Prior to Visit  Medication Sig Dispense Refill   acetaminophen (TYLENOL) 500 MG tablet Take 500 mg every 6 (six) hours as needed by mouth.     cholecalciferol (VITAMIN D3) 25 MCG (1000 UNIT) tablet Take 1,000 Units by mouth 2 (two) times daily.     D-Mannose 500 MG CAPS Take 500 mg by mouth daily.     famotidine (PEPCID) 20 MG tablet Take 1 tablet (20 mg total) by mouth 2 (two) times daily. 180 tablet 1   Multiple Vitamins-Minerals (PRESERVISION AREDS 2 PO) Take by mouth.     omeprazole (PRILOSEC) 20 MG capsule TAKE 1 CAPSULE(20 MG) BY MOUTH DAILY 90 capsule 3   pyridOXINE (VITAMIN B-6) 100 MG tablet Take 100 mg by mouth daily.     rosuvastatin (CRESTOR) 20 MG tablet One at night 90 tablet 0   venlafaxine XR (EFFEXOR-XR) 150 MG 24 hr capsule TAKE 1 CAPSULE(150 MG) BY MOUTH DAILY WITH BREAKFAST 90 capsule 1   vitamin B-12 (CYANOCOBALAMIN) 1000 MCG tablet Take 1,000 mcg by mouth daily.     No current facility-administered medications on file prior to visit.   Past Medical History:  Diagnosis Date   Chronic insomnia    Depression    Gastroesophageal reflux disease     Hyperlipidemia    Osteoporosis    Precordial chest pain 11/17/2016   Prediabetes    Past Surgical History:  Procedure Laterality Date   CHOLECYSTECTOMY  1999   CORONARY CALCIUM SCORE/CORONARY CTA  01/2017   Coronary calcium score is 0.  Normal coronary origin with normal right dominance.  No evidence of CAD.  Most likely noncardiac chest pain.   SHOULDER SURGERY  2005   TONSILLECTOMY     TUBAL LIGATION  1977    Family History  Problem Relation Age of Onset   Diabetes Mother    Hyperlipidemia Mother    Emphysema Father    Diabetes Maternal Grandmother    Liver cancer Maternal Grandmother    Social History   Socioeconomic History   Marital status: Married    Spouse name: Not on file   Number of children: 3   Years of education: 12   Highest education level: Associate degree: academic program  Occupational History   Occupation: Retired  Tobacco Use   Smoking status: Never   Smokeless tobacco: Never  Substance and Sexual Activity   Alcohol use: No   Drug use: No   Sexual activity: Not Currently  Other Topics Concern   Not on file  Social History Narrative   She is a married (remarried) mother of 3 with 4 grandchildren.  She has 3 great-grandchildren.  She previously worked at [employer] in the [employer].  She completed high school and beauty school.   She works outside a lot in the yard on gardening.  She does a lot of walking and exercises with walking for at least a half an hour a day 5 days a week.   wears sunscreen, brushes and flosses daily, see's dentist bi-annually, has smoke/carbon monoxide detectors, wears a seatbelt and practices gun safety   Social Determinants of Health   Financial Resource Strain: Not on file  Food Insecurity: No Food Insecurity   Worried About Programme researcher, broadcasting/film/video in the Last Year: Never true   Ran Out of Food in the Last Year: Never true  Transportation Needs: No Transportation Needs   Lack of Transportation (Medical): No   Lack of  Transportation (Non-Medical): No  Physical Activity: Not on file  Stress: Not on file  Social Connections: Not on file    Review of Systems  Constitutional:  Negative for chills, fatigue and fever.  HENT:  Negative for congestion, rhinorrhea and sore throat.   Respiratory:  Negative for cough and shortness of breath.   Cardiovascular:  Negative for chest pain.  Gastrointestinal:  Negative for abdominal pain, constipation, diarrhea, nausea and vomiting.  Genitourinary:  Negative for dysuria and urgency.  Musculoskeletal:  Positive for back pain. Negative for myalgias.  Neurological:  Positive for headaches. Negative for dizziness, weakness and light-headedness.  Psychiatric/Behavioral:  Positive for dysphoric mood (grieving from recent loss of daughter.). The patient is not nervous/anxious.     Objective:  BP 130/82    Pulse 84    Temp 97.8 F (36.6 C)    Resp 16    Ht 5\' 6"  (1.676 m)    Wt 159 lb (72.1 kg)    BMI 25.66 kg/m   BP/Weight 01/06/2021 12/22/2020 12/05/2020  Systolic BP 130 130 142  Diastolic BP 82 80 62  Wt. (Lbs) 159 156.8 154  BMI 25.66 25.31 24.86    Physical Exam Vitals reviewed.  Constitutional:      Appearance: Normal appearance. She is normal weight.  Neck:     Vascular: No carotid bruit.  Cardiovascular:     Rate and Rhythm: Normal rate and regular rhythm.     Heart sounds: Normal heart sounds.  Pulmonary:     Effort: Pulmonary effort is normal. No respiratory distress.     Breath sounds: Normal breath sounds.  Abdominal:     General: Abdomen is flat. Bowel sounds are normal.     Palpations: Abdomen is soft.     Tenderness: There is no abdominal tenderness.  Neurological:     Mental Status: She is alert and oriented to person, place, and time.  Psychiatric:        Mood and Affect: Mood normal.        Behavior: Behavior normal.     Lab Results  Component Value Date   WBC 5.9 01/06/2021   HGB 13.5 01/06/2021   HCT 39.2 01/06/2021   PLT 326  01/06/2021   GLUCOSE 93 01/06/2021   CHOL 149 01/06/2021   TRIG 154 (H) 01/06/2021   HDL 52 01/06/2021   LDLCALC 71 01/06/2021   ALT 19 01/06/2021   AST 21 01/06/2021   NA 139 01/06/2021   K 4.5 01/06/2021   CL 101 01/06/2021   CREATININE 0.58 01/06/2021   BUN 11 01/06/2021   CO2 25 01/06/2021   TSH 2.380 01/06/2021   HGBA1C 6.4 (H) 01/06/2021  MICROALBUR 10 05/29/2020      Assessment & Plan:   Problem List Items Addressed This Visit       Digestive   Gastroesophageal reflux disease    The current medical regimen is effective;  continue present plan and medications. Unable to wean off PPI.        Nervous and Auditory   Idiopathic peripheral neuropathy    Taking otc b6, b12.        Musculoskeletal and Integument   Age-related osteoporosis without current pathological fracture    Pt agreed to prolia. We will need to have this approved.         Other   Mixed hyperlipidemia    Well controlled.  No changes to medicines.  Continue to work on eating a healthy diet and exercise.  Labs drawn today.        Relevant Orders   Lipid panel (Completed)   TSH (Completed)   Prediabetes - Primary    Recommend continue to work on eating healthy diet and exercise.       Relevant Orders   Comprehensive metabolic panel (Completed)   Hemoglobin A1c (Completed)   CBC with Differential/Platelet (Completed)   Mild recurrent major depression (HCC)    The current medical regimen is effective;  continue present plan and medications.        GAD (generalized anxiety disorder)    Lorazepam       Macular degeneration    New dx. Scheduled to see [facility]      Follow-up: Return in about 6 months (around 07/06/2021) for chronic fasting.  An After Visit Summary was printed and given to the patient.  Blane Ohara, MD [facility] [phone]

## 2021-01-06 NOTE — Assessment & Plan Note (Signed)
The current medical regimen is effective;  continue present plan and medications. Unable to wean off PPI.

## 2021-01-06 NOTE — Assessment & Plan Note (Signed)
Pt agreed to prolia. We will need to have this approved.

## 2021-01-06 NOTE — Assessment & Plan Note (Signed)
Well controlled.  ?No changes to medicines.  ?Continue to work on eating a healthy diet and exercise.  ?Labs drawn today.  ?

## 2021-01-06 NOTE — Assessment & Plan Note (Signed)
The current medical regimen is effective;  continue present plan and medications.  

## 2021-01-06 NOTE — Assessment & Plan Note (Signed)
Taking otc b6, b12.

## 2021-01-06 NOTE — Assessment & Plan Note (Signed)
Lorazepam ° °

## 2021-01-07 LAB — COMPREHENSIVE METABOLIC PANEL
ALT: 19 IU/L (ref 0–32)
AST: 21 IU/L (ref 0–40)
Albumin/Globulin Ratio: 2.1 (ref 1.2–2.2)
Albumin: 4.5 g/dL (ref 3.7–4.7)
Alkaline Phosphatase: 85 IU/L (ref 44–121)
BUN/Creatinine Ratio: 19 (ref 12–28)
BUN: 11 mg/dL (ref 8–27)
Bilirubin Total: 0.4 mg/dL (ref 0.0–1.2)
CO2: 25 mmol/L (ref 20–29)
Calcium: 9.3 mg/dL (ref 8.7–10.3)
Chloride: 101 mmol/L (ref 96–106)
Creatinine, Ser: 0.58 mg/dL (ref 0.57–1.00)
Globulin, Total: 2.1 g/dL (ref 1.5–4.5)
Glucose: 93 mg/dL (ref 70–99)
Potassium: 4.5 mmol/L (ref 3.5–5.2)
Sodium: 139 mmol/L (ref 134–144)
Total Protein: 6.6 g/dL (ref 6.0–8.5)
eGFR: 96 mL/min/{1.73_m2} (ref >59)

## 2021-01-07 LAB — LIPID PANEL
Chol/HDL Ratio: 2.9 ratio (ref 0.0–4.4)
Cholesterol, Total: 149 mg/dL (ref 100–199)
HDL: 52 mg/dL (ref >39)
LDL Chol Calc (NIH): 71 mg/dL (ref 0–99)
Triglycerides: 154 mg/dL — ABNORMAL HIGH (ref 0–149)
VLDL Cholesterol Cal: 26 mg/dL (ref 5–40)

## 2021-01-07 LAB — HEMOGLOBIN A1C
Est. average glucose Bld gHb Est-mCnc: 137 mg/dL
Hgb A1c MFr Bld: 6.4 % — ABNORMAL HIGH (ref 4.8–5.6)

## 2021-01-07 LAB — CBC WITH DIFFERENTIAL/PLATELET
Basophils Absolute: 0 10*3/uL (ref 0.0–0.2)
Basos: 0 %
EOS (ABSOLUTE): 0.1 10*3/uL (ref 0.0–0.4)
Eos: 2 %
Hematocrit: 39.2 % (ref 34.0–46.6)
Hemoglobin: 13.5 g/dL (ref 11.1–15.9)
Immature Grans (Abs): 0 10*3/uL (ref 0.0–0.1)
Immature Granulocytes: 0 %
Lymphocytes Absolute: 2 10*3/uL (ref 0.7–3.1)
Lymphs: 33 %
MCH: 29.5 pg (ref 26.6–33.0)
MCHC: 34.4 g/dL (ref 31.5–35.7)
MCV: 86 fL (ref 79–97)
Monocytes Absolute: 0.4 10*3/uL (ref 0.1–0.9)
Monocytes: 7 %
Neutrophils Absolute: 3.4 10*3/uL (ref 1.4–7.0)
Neutrophils: 58 %
Platelets: 326 10*3/uL (ref 150–450)
RBC: 4.57 x10E6/uL (ref 3.77–5.28)
RDW: 12.7 % (ref 11.7–15.4)
WBC: 5.9 10*3/uL (ref 3.4–10.8)

## 2021-01-07 LAB — TSH: TSH: 2.38 u[IU]/mL (ref 0.450–4.500)

## 2021-01-07 NOTE — Progress Notes (Signed)
Blood count normal.  Liver function normal.  Kidney function normal.  Thyroid function normal.  Cholesterol: HBA1C: 6.4. Tenth of an appt away from being diabetic. Recommend start on metformin 500 mg once daily.

## 2021-01-15 DIAGNOSIS — H353112 Nonexudative age-related macular degeneration, right eye, intermediate dry stage: Secondary | ICD-10-CM | POA: Diagnosis not present

## 2021-01-15 DIAGNOSIS — H353221 Exudative age-related macular degeneration, left eye, with active choroidal neovascularization: Secondary | ICD-10-CM | POA: Diagnosis not present

## 2021-01-18 ENCOUNTER — Other Ambulatory Visit: Payer: Self-pay | Admitting: Nurse Practitioner

## 2021-01-18 ENCOUNTER — Other Ambulatory Visit: Payer: Self-pay | Admitting: Family Medicine

## 2021-01-18 DIAGNOSIS — F411 Generalized anxiety disorder: Secondary | ICD-10-CM

## 2021-01-26 ENCOUNTER — Ambulatory Visit: Payer: PPO | Admitting: Nurse Practitioner

## 2021-02-09 ENCOUNTER — Other Ambulatory Visit: Payer: Self-pay | Admitting: Family Medicine

## 2021-02-18 ENCOUNTER — Other Ambulatory Visit: Payer: Self-pay | Admitting: Nurse Practitioner

## 2021-02-18 DIAGNOSIS — F411 Generalized anxiety disorder: Secondary | ICD-10-CM

## 2021-02-19 DIAGNOSIS — H353221 Exudative age-related macular degeneration, left eye, with active choroidal neovascularization: Secondary | ICD-10-CM | POA: Diagnosis not present

## 2021-03-02 ENCOUNTER — Telehealth: Payer: Self-pay

## 2021-03-02 NOTE — Progress Notes (Signed)
° ° °  Chronic Care Management Pharmacy Assistant   Name: Virginia Montoya  MRN: [id number] DOB: Mar 17, 1950  Reason for Encounter: General Adherence Call    Recent office visits:  01/06/21 Blane Ohara MD. Seen for Prediabetes. No med changes.   12/22/20 Marianne Sofia PA-C. Seen for Sore Throat. Started on Augmentin 875-125mg .   12/05/20 Flonnie Hailstone NP. Seen for Cough and Sore Throat. Started on Oseltamivir Phosphate 75mg   Recent consult visits:  None  Hospital visits:  None  Medications: Outpatient Encounter Medications as of 03/02/2021  Medication Sig   acetaminophen (TYLENOL) 500 MG tablet Take 500 mg every 6 (six) hours as needed by mouth.   cholecalciferol (VITAMIN D3) 25 MCG (1000 UNIT) tablet Take 1,000 Units by mouth 2 (two) times daily.   D-Mannose 500 MG CAPS Take 500 mg by mouth daily.   famotidine (PEPCID) 20 MG tablet Take 1 tablet (20 mg total) by mouth 2 (two) times daily.   LORazepam (ATIVAN) 0.5 MG tablet TAKE 1 TABLET(0.5 MG) BY MOUTH EVERY 8 HOURS AS NEEDED FOR ANXIETY   Multiple Vitamins-Minerals (PRESERVISION AREDS 2 PO) Take by mouth.   omeprazole (PRILOSEC) 20 MG capsule TAKE 1 CAPSULE(20 MG) BY MOUTH DAILY   pyridOXINE (VITAMIN B-6) 100 MG tablet Take 100 mg by mouth daily.   rosuvastatin (CRESTOR) 20 MG tablet TAKE 1 TABLET BY MOUTH AT NIGHT   venlafaxine XR (EFFEXOR-XR) 150 MG 24 hr capsule TAKE 1 CAPSULE(150 MG) BY MOUTH DAILY WITH BREAKFAST   vitamin B-12 (CYANOCOBALAMIN) 1000 MCG tablet Take 1,000 mcg by mouth daily.   zolpidem (AMBIEN) 10 MG tablet TAKE 1 TABLET BY MOUTH AT BEDTIME   No facility-administered encounter medications on file as of 03/02/2021.    Contacted [name] for general disease state and medication adherence call.   Patient is > 5 days past due for refill on the following medications per chart history:  [facility] Medications: Medication Name/mg Last Fill Days Supply Rosuvastatin 20 mg               02/07/21  90ds 11/13/20           90ds   Care Gaps: Last annual wellness visit? 12/03/19 Mammogram: 02/26/19 Dexa Scan: 02/26/19 Last eye exam / retinopathy screening?None noted  Diabetic foot exam?None noted   02/28/19, CMA Clinical Pharmacist Assistant  [phone]  Unable to reach pt to complete this call

## 2021-03-06 ENCOUNTER — Encounter: Payer: Self-pay | Admitting: Family Medicine

## 2021-03-19 DIAGNOSIS — H353221 Exudative age-related macular degeneration, left eye, with active choroidal neovascularization: Secondary | ICD-10-CM | POA: Diagnosis not present

## 2021-03-27 ENCOUNTER — Ambulatory Visit (INDEPENDENT_AMBULATORY_CARE_PROVIDER_SITE_OTHER): Payer: PPO | Admitting: Nurse Practitioner

## 2021-03-27 ENCOUNTER — Encounter: Payer: Self-pay | Admitting: Nurse Practitioner

## 2021-03-27 VITALS — BP 136/82 | HR 86 | Temp 98.8°F | Ht 66.0 in | Wt 159.0 lb

## 2021-03-27 DIAGNOSIS — J018 Other acute sinusitis: Secondary | ICD-10-CM

## 2021-03-27 MED ORDER — AMOXICILLIN 875 MG PO TABS: 875.0000 mg | ORAL_TABLET | Freq: Two times a day (BID) | ORAL | 0 refills | Status: AC

## 2021-03-27 NOTE — Patient Instructions (Addendum)
Rest and push fluids ?Use Flonase nasal spray daily ?Take Amoxicillin twice daily for 10 days ?Notify office if symptoms fail to improve or worsenSinusitis, Adult ?Sinusitis is soreness and swelling (inflammation) of your sinuses. Sinuses are hollow spaces in the bones around your face. They are located: ?Around your eyes. ?In the middle of your forehead. ?Behind your nose. ?In your cheekbones. ?Your sinuses and nasal passages are lined with a fluid called mucus. Mucus drains out of your sinuses. Swelling can trap mucus in your sinuses. This lets germs (bacteria, virus, or fungus) grow, which leads to infection. Most of the time, this condition is caused by a virus. ?What are the causes? ?This condition is caused by: ?Allergies. ?Asthma. ?Germs. ?Things that block your nose or sinuses. ?Growths in the nose (nasal polyps). ?Chemicals or irritants in the air. ?Fungus (rare). ?What increases the risk? ?You are more likely to develop this condition if: ?You have a weak body defense system (immune system). ?You do a lot of swimming or diving. ?You use nasal sprays too much. ?You smoke. ?What are the signs or symptoms? ?The main symptoms of this condition are pain and a feeling of pressure around the sinuses. Other symptoms include: ?Stuffy nose (congestion). ?Runny nose (drainage). ?Swelling and warmth in the sinuses. ?Headache. ?Toothache. ?A cough that may get worse at night. ?Mucus that collects in the throat or the back of the nose (postnasal drip). ?Being unable to smell and taste. ?Being very tired (fatigue). ?A fever. ?Sore throat. ?Bad breath. ?How is this diagnosed? ?This condition is diagnosed based on: ?Your symptoms. ?Your medical history. ?A physical exam. ?Tests to find out if your condition is short-term (acute) or long-term (chronic). Your doctor may: ?Check your nose for growths (polyps). ?Check your sinuses using a tool that has a light (endoscope). ?Check for allergies or germs. ?Do imaging tests, such  as an MRI or CT scan. ?How is this treated? ?Treatment for this condition depends on the cause and whether it is short-term or long-term. ?If caused by a virus, your symptoms should go away on their own within 10 days. You may be given medicines to relieve symptoms. They include: ?Medicines that shrink swollen tissue in the nose. ?Medicines that treat allergies (antihistamines). ?A spray that treats swelling of the nostrils.  ?Rinses that help get rid of thick mucus in your nose (nasal saline washes). ?If caused by bacteria, your doctor may wait to see if you will get better without treatment. You may be given antibiotic medicine if you have: ?A very bad infection. ?A weak body defense system. ?If caused by growths in the nose, you may need to have surgery. ?Follow these instructions at home: ?Medicines ?Take, use, or apply over-the-counter and prescription medicines only as told by your doctor. These may include nasal sprays. ?If you were prescribed an antibiotic medicine, take it as told by your doctor. Do not stop taking the antibiotic even if you start to feel better. ?Hydrate and humidify ? ?Drink enough water to keep your pee (urine) pale yellow. ?Use a cool mist humidifier to keep the humidity level in your home above 50%. ?Breathe in steam for 10-15 minutes, 3-4 times a day, or as told by your doctor. You can do this in the bathroom while a hot shower is running. ?Try not to spend time in cool or dry air. ?Rest ?Rest as much as you can. ?Sleep with your head raised (elevated). ?Make sure you get enough sleep each night. ?General instructions ? ?Put  a warm, moist washcloth on your face 3-4 times a day, or as often as told by your doctor. This will help with discomfort. ?Wash your hands often with soap and water. If there is no soap and water, use hand sanitizer. ?Do not smoke. Avoid being around people who are smoking (secondhand smoke). ?Keep all follow-up visits as told by your doctor. This is  important. ?Contact a doctor if: ?You have a fever. ?Your symptoms get worse. ?Your symptoms do not get better within 10 days. ?Get help right away if: ?You have a very bad headache. ?You cannot stop throwing up (vomiting). ?You have very bad pain or swelling around your face or eyes. ?You have trouble seeing. ?You feel confused. ?Your neck is stiff. ?You have trouble breathing. ?Summary ?Sinusitis is swelling of your sinuses. Sinuses are hollow spaces in the bones around your face. ?This condition is caused by tissues in your nose that become inflamed or swollen. This traps germs. These can lead to infection. ?If you were prescribed an antibiotic medicine, take it as told by your doctor. Do not stop taking it even if you start to feel better. ?Keep all follow-up visits as told by your doctor. This is important. ?This information is not intended to replace advice given to you by your health care provider. Make sure you discuss any questions you have with your health care provider. ?Document Revised: 05/23/2017 Document Reviewed: 05/23/2017 ?Elsevier Patient Education ? 2022 [employer] ? ?

## 2021-03-27 NOTE — Progress Notes (Signed)
? ?Acute Office Visit ? ?Subjective:  ? ? Patient ID: Virginia Montoya, female    DOB: Mar 17, 1950, 72 y.o.   MRN: [id number] ? ?Chief Complaint  ?Patient presents with  ? Cough  ?  Congestion/headache  ? ? ?HPI ? ?Patient is in today for URI symptoms of cough, sinus congestion, rhinorrhea, and post-nasal-drip. Onset of symptoms was three days ago. She denies treatments for symptoms. Home COVID-19 test negative last night. States her spouse has similar symptoms.  ? ?Past Medical History:  ?Diagnosis Date  ? Chronic insomnia   ? Depression   ? Gastroesophageal reflux disease   ? Hyperlipidemia   ? Osteoporosis   ? Precordial chest pain 11/17/2016  ? Prediabetes   ? ? ?Past Surgical History:  ?Procedure Laterality Date  ? CHOLECYSTECTOMY  1999  ? CORONARY CALCIUM SCORE/CORONARY CTA  01/2017  ? Coronary calcium score is 0.  Normal coronary origin with normal right dominance.  No evidence of CAD.  Most likely noncardiac chest pain.  ? SHOULDER SURGERY  2005  ? TONSILLECTOMY    ? TUBAL LIGATION  1977  ? ? ?Family History  ?Problem Relation Age of Onset  ? Diabetes Mother   ? Hyperlipidemia Mother   ? Emphysema Father   ? Diabetes Maternal Grandmother   ? Liver cancer Maternal Grandmother   ? ? ?Social History  ? ?Socioeconomic History  ? Marital status: Married  ?  Spouse name: Not on file  ? Number of children: 3  ? Years of education: 42  ? Highest education level: Associate degree: academic program  ?Occupational History  ? Occupation: Retired  ?Tobacco Use  ? Smoking status: Never  ? Smokeless tobacco: Never  ?Substance and Sexual Activity  ? Alcohol use: No  ? Drug use: No  ? Sexual activity: Not Currently  ?Other Topics Concern  ? Not on file  ?Social History Narrative  ? She is a married (remarried) mother of 3 with 4 grandchildren.  She has 3 great-grandchildren.  She previously worked at [employer] in [place].  She completed high school and beauty school.  ? She works outside a lot in the yard on gardening.   She does a lot of walking and exercises with walking for at least a half an hour a day 5 days a week.  ? wears sunscreen, brushes and flosses daily, see's dentist bi-annually, has smoke/carbon monoxide detectors, wears a seatbelt and practices gun safety  ? ?Social Determinants of Health  ? ?Financial Resource Strain: Not on file  ?Food Insecurity: No Food Insecurity  ? Worried About Charity fundraiser in the Last Year: Never true  ? Ran Out of Food in the Last Year: Never true  ?Transportation Needs: No Transportation Needs  ? Lack of Transportation (Medical): No  ? Lack of Transportation (Non-Medical): No  ?Physical Activity: Not on file  ?Stress: Not on file  ?Social Connections: Not on file  ?Intimate Partner Violence: Not on file  ? ? ?Outpatient Medications Prior to Visit  ?Medication Sig Dispense Refill  ? acetaminophen (TYLENOL) 500 MG tablet Take 500 mg every 6 (six) hours as needed by mouth.    ? cholecalciferol (VITAMIN D3) 25 MCG (1000 UNIT) tablet Take 1,000 Units by mouth 2 (two) times daily.    ? D-Mannose 500 MG CAPS Take 500 mg by mouth daily.    ? LORazepam (ATIVAN) 0.5 MG tablet TAKE 1 TABLET(0.5 MG) BY MOUTH EVERY 8 HOURS AS NEEDED FOR ANXIETY 90  tablet 0  ? Multiple Vitamins-Minerals (PRESERVISION AREDS 2 PO) Take by mouth.    ? omeprazole (PRILOSEC) 20 MG capsule TAKE 1 CAPSULE(20 MG) BY MOUTH DAILY 90 capsule 3  ? pyridOXINE (VITAMIN B-6) 100 MG tablet Take 100 mg by mouth daily.    ? rosuvastatin (CRESTOR) 20 MG tablet TAKE 1 TABLET BY MOUTH AT NIGHT 90 tablet 0  ? venlafaxine XR (EFFEXOR-XR) 150 MG 24 hr capsule TAKE 1 CAPSULE(150 MG) BY MOUTH DAILY WITH BREAKFAST 90 capsule 1  ? vitamin B-12 (CYANOCOBALAMIN) 1000 MCG tablet Take 1,000 mcg by mouth daily.    ? zolpidem (AMBIEN) 10 MG tablet TAKE 1 TABLET BY MOUTH AT BEDTIME 30 tablet 3  ? famotidine (PEPCID) 20 MG tablet Take 1 tablet (20 mg total) by mouth 2 (two) times daily. 180 tablet 1  ? ?No facility-administered medications prior to  visit.  ? ? ?Allergies  ?Allergen Reactions  ? Cephalexin Nausea And Vomiting  ?  ask  ? Alendronate   ?  Gi upset  ? Biaxin [Clarithromycin] Other (See Comments)  ?  CHEST PAIN  ? Sulfa Antibiotics   ?  Made her feel poorly.   ? ? ?Review of Systems  ?Constitutional:  Positive for fatigue. Negative for appetite change and fever.  ?HENT:  Positive for congestion, sinus pressure and sinus pain. Negative for ear pain and sore throat.   ?Respiratory:  Positive for cough. Negative for chest tightness, shortness of breath and wheezing.   ?Cardiovascular:  Negative for chest pain and palpitations.  ?Gastrointestinal:  Negative for abdominal pain, constipation, diarrhea, nausea and vomiting.  ?Genitourinary:  Negative for dysuria and hematuria.  ?Musculoskeletal:  Negative for arthralgias, back pain, joint swelling and myalgias.  ?Skin:  Negative for rash.  ?Allergic/Immunologic: Positive for environmental allergies.  ?Neurological:  Positive for headaches. Negative for dizziness and weakness.  ?Psychiatric/Behavioral:  Negative for dysphoric mood. The patient is not nervous/anxious.   ? ?   ?Objective:  ?  ?Physical Exam ?Vitals reviewed.  ?Constitutional:   ?   Appearance: She is ill-appearing.  ?HENT:  ?   Right Ear: Tympanic membrane normal.  ?   Left Ear: Tympanic membrane normal.  ?   Nose: Congestion and rhinorrhea present.  ?   Mouth/Throat:  ?   Pharynx: Posterior oropharyngeal erythema present.  ?Cardiovascular:  ?   Rate and Rhythm: Normal rate and regular rhythm.  ?   Pulses: Normal pulses.  ?   Heart sounds: Normal heart sounds.  ?Pulmonary:  ?   Effort: Pulmonary effort is normal. No respiratory distress.  ?   Breath sounds: Normal breath sounds.  ?Skin: ?   General: Skin is warm and dry.  ?   Capillary Refill: Capillary refill takes less than 2 seconds.  ?Neurological:  ?   General: No focal deficit present.  ?   Mental Status: She is alert and oriented to person, place, and time.  ?Psychiatric:     ?   Mood  and Affect: Mood normal.     ?   Behavior: Behavior normal.  ? ? ?BP 136/82 (BP Location: Left Arm, Patient Position: Sitting)   Pulse 86   Temp 98.8 ?F (37.1 ?C) (Oral)   Ht $R'5\' 6"'Ky$  (1.676 m)   Wt 159 lb (72.1 kg)   SpO2 97%   BMI 25.66 kg/m?  ?Wt Readings from Last 3 Encounters:  ?03/27/21 159 lb (72.1 kg)  ?01/06/21 159 lb (72.1 kg)  ?12/22/20 156 lb 12.8 oz (  71.1 kg)  ? ? ?Health Maintenance Due  ?Topic Date Due  ? Hepatitis C Screening  Never done  ? Zoster Vaccines- Shingrix (2 of 2) 12/28/2016  ? Pneumonia Vaccine 47+ Years old (2 - PCV) 10/12/2017  ? Fecal DNA (Cologuard)  05/27/2019  ? COVID-19 Vaccine (4 - Booster for Pfizer series) 01/31/2020  ? MAMMOGRAM  02/25/2021  ? ? ? ? ? ?Lab Results  ?Component Value Date  ? TSH 2.380 01/06/2021  ? ?Lab Results  ?Component Value Date  ? WBC 5.9 01/06/2021  ? HGB 13.5 01/06/2021  ? HCT 39.2 01/06/2021  ? MCV 86 01/06/2021  ? PLT 326 01/06/2021  ? ?Lab Results  ?Component Value Date  ? NA 139 01/06/2021  ? K 4.5 01/06/2021  ? CO2 25 01/06/2021  ? GLUCOSE 93 01/06/2021  ? BUN 11 01/06/2021  ? CREATININE 0.58 01/06/2021  ? BILITOT 0.4 01/06/2021  ? ALKPHOS 85 01/06/2021  ? AST 21 01/06/2021  ? ALT 19 01/06/2021  ? PROT 6.6 01/06/2021  ? ALBUMIN 4.5 01/06/2021  ? CALCIUM 9.3 01/06/2021  ? EGFR 96 01/06/2021  ? ?Lab Results  ?Component Value Date  ? CHOL 149 01/06/2021  ? ?Lab Results  ?Component Value Date  ? HDL 52 01/06/2021  ? ?Lab Results  ?Component Value Date  ? [place] 71 01/06/2021  ? ?Lab Results  ?Component Value Date  ? TRIG 154 (H) 01/06/2021  ? ?Lab Results  ?Component Value Date  ? CHOLHDL 2.9 01/06/2021  ? ?Lab Results  ?Component Value Date  ? HGBA1C 6.4 (H) 01/06/2021  ? ? ? ? ?   ?Assessment & Plan:  ? ?1. Acute non-recurrent sinusitis of other sinus ?- amoxicillin (AMOXIL) 875 MG tablet; Take 1 tablet (875 mg total) by mouth 2 (two) times daily for 10 days.  Dispense: 20 tablet; Refill: 0 ?  ?Rest and push fluids ?Use Flonase nasal spray  daily ?Take Amoxicillin twice daily for 10 days ?Notify office if symptoms fail to improve or worsen ? ? ?I,Lauren M Auman,acting as a Education administrator for [employer], NP.,have documented all relevant documentation o

## 2021-04-16 DIAGNOSIS — H353221 Exudative age-related macular degeneration, left eye, with active choroidal neovascularization: Secondary | ICD-10-CM | POA: Diagnosis not present

## 2021-04-18 ENCOUNTER — Other Ambulatory Visit: Payer: Self-pay | Admitting: Family Medicine

## 2021-04-19 ENCOUNTER — Other Ambulatory Visit: Payer: Self-pay | Admitting: Nurse Practitioner

## 2021-04-19 DIAGNOSIS — F411 Generalized anxiety disorder: Secondary | ICD-10-CM

## 2021-04-20 DIAGNOSIS — Z1211 Encounter for screening for malignant neoplasm of colon: Secondary | ICD-10-CM | POA: Diagnosis not present

## 2021-04-21 ENCOUNTER — Encounter: Payer: Self-pay | Admitting: Physician Assistant

## 2021-04-21 ENCOUNTER — Ambulatory Visit (INDEPENDENT_AMBULATORY_CARE_PROVIDER_SITE_OTHER): Payer: PPO | Admitting: Physician Assistant

## 2021-04-21 VITALS — BP 126/82 | HR 89 | Temp 98.3°F | Ht 66.0 in | Wt 160.8 lb

## 2021-04-21 DIAGNOSIS — M25561 Pain in right knee: Secondary | ICD-10-CM

## 2021-04-21 MED ORDER — MELOXICAM 15 MG PO TABS: 15.0000 mg | ORAL_TABLET | Freq: Every day | ORAL | 0 refills | Status: DC

## 2021-04-21 MED ORDER — PREDNISONE 20 MG PO TABS: ORAL_TABLET | ORAL | 0 refills | Status: DC

## 2021-04-21 NOTE — Progress Notes (Signed)
? ?Acute Office Visit ? ?Subjective:  ? ? Patient ID: Virginia Montoya, female    DOB: 1950-05-24, 71 y.o.   MRN: [id number] ? ?Chief Complaint  ?Patient presents with  ? Knee Pain  ? Leg Pain  ? ? ?HPI: ?Patient is in today for complaints of right knee pain and swelling for the past few weeks.  It started after she slipped and missed a step going down into a living room .  Says she has pain behind her knee and also some swelling around her patella - denies any warmth or erythema. ?Says she has been using her cane for walking because putting pressure on leg and walking is when she has the most pain.  To bend or move knee around while sitting only causes minimal discomfort ?She is taking ibuprofen which helps some ? ?Past Medical History:  ?Diagnosis Date  ? Chronic insomnia   ? Depression   ? Gastroesophageal reflux disease   ? Hyperlipidemia   ? Osteoporosis   ? Precordial chest pain 11/17/2016  ? Prediabetes   ? ? ?Past Surgical History:  ?Procedure Laterality Date  ? CHOLECYSTECTOMY  1999  ? CORONARY CALCIUM SCORE/CORONARY CTA  01/2017  ? Coronary calcium score is 0.  Normal coronary origin with normal right dominance.  No evidence of CAD.  Most likely noncardiac chest pain.  ? SHOULDER SURGERY  2005  ? TONSILLECTOMY    ? TUBAL LIGATION  1977  ? ? ?Family History  ?Problem Relation Age of Onset  ? Diabetes Mother   ? Hyperlipidemia Mother   ? Emphysema Father   ? Diabetes Maternal Grandmother   ? Liver cancer Maternal Grandmother   ? ? ?Social History  ? ?Socioeconomic History  ? Marital status: Married  ?  Spouse name: Not on file  ? Number of children: 3  ? Years of education: 26  ? Highest education level: Associate degree: academic program  ?Occupational History  ? Occupation: Retired  ?Tobacco Use  ? Smoking status: Never  ? Smokeless tobacco: Never  ?Substance and Sexual Activity  ? Alcohol use: No  ? Drug use: No  ? Sexual activity: Not Currently  ?Other Topics Concern  ? Not on file  ?Social History Narrative   ? She is a married (remarried) mother of 3 with 4 grandchildren.  She has 3 great-grandchildren.  She previously worked at [employer] in [place].  She completed high school and beauty school.  ? She works outside a lot in the yard on gardening.  She does a lot of walking and exercises with walking for at least a half an hour a day 5 days a week.  ? wears sunscreen, brushes and flosses daily, see's dentist bi-annually, has smoke/carbon monoxide detectors, wears a seatbelt and practices gun safety  ? ?Social Determinants of Health  ? ?Financial Resource Strain: Not on file  ?Food Insecurity: No Food Insecurity  ? Worried About Charity fundraiser in the Last Year: Never true  ? Ran Out of Food in the Last Year: Never true  ?Transportation Needs: No Transportation Needs  ? Lack of Transportation (Medical): No  ? Lack of Transportation (Non-Medical): No  ?Physical Activity: Not on file  ?Stress: Not on file  ?Social Connections: Not on file  ?Intimate Partner Violence: Not on file  ? ? ?Outpatient Medications Prior to Visit  ?Medication Sig Dispense Refill  ? acetaminophen (TYLENOL) 500 MG tablet Take 500 mg every 6 (six) hours as needed by  mouth.    ? cholecalciferol (VITAMIN D3) 25 MCG (1000 UNIT) tablet Take 1,000 Units by mouth 2 (two) times daily.    ? LORazepam (ATIVAN) 0.5 MG tablet TAKE 1 TABLET(0.5 MG) BY MOUTH EVERY 8 HOURS AS NEEDED FOR ANXIETY 90 tablet 0  ? Multiple Vitamins-Minerals (PRESERVISION AREDS 2 PO) Take by mouth.    ? omeprazole (PRILOSEC) 20 MG capsule TAKE 1 CAPSULE(20 MG) BY MOUTH DAILY 90 capsule 3  ? pyridOXINE (VITAMIN B-6) 100 MG tablet Take 100 mg by mouth daily.    ? rosuvastatin (CRESTOR) 20 MG tablet TAKE 1 TABLET BY MOUTH AT NIGHT 90 tablet 0  ? venlafaxine XR (EFFEXOR-XR) 150 MG 24 hr capsule TAKE 1 CAPSULE(150 MG) BY MOUTH DAILY WITH BREAKFAST 90 capsule 1  ? vitamin B-12 (CYANOCOBALAMIN) 1000 MCG tablet Take 1,000 mcg by mouth daily.    ? zolpidem (AMBIEN) 10 MG tablet  TAKE 1 TABLET BY MOUTH AT BEDTIME 30 tablet 3  ? D-Mannose 500 MG CAPS Take 500 mg by mouth daily.    ? famotidine (PEPCID) 20 MG tablet Take 1 tablet (20 mg total) by mouth 2 (two) times daily. 180 tablet 1  ? ?No facility-administered medications prior to visit.  ? ? ?Allergies  ?Allergen Reactions  ? Cephalexin Nausea And Vomiting  ?  ask  ? Alendronate   ?  Gi upset  ? Biaxin [Clarithromycin] Other (See Comments)  ?  CHEST PAIN  ? Sulfa Antibiotics   ?  Made her feel poorly.   ? ? ?Review of Systems ?CONSTITUTIONAL: Negative for chills, fatigue, fever, unintentional weight gain and unintentional weight loss.  ? ?CARDIOVASCULAR: Negative for chest pain, dizziness, palpitations and pedal edema.  ?RESPIRATORY: Negative for recent cough and dyspnea.  ?MSK: see HPI ?INTEGUMENTARY: Negative for rash.  ? ? ?   ?Objective:  ? PHYSICAL EXAM:  ? ?VS: BP 126/82   Pulse 89   Temp 98.3 ?F (36.8 ?C)   Ht $R'5\' 6"'bp$  (1.676 m)   Wt 160 lb 12.8 oz (72.9 kg)   SpO2 98%   BMI 25.95 kg/m?  ? ?GEN: Well nourished, well developed, in no acute distress  ?Cardiac: RRR; no murmurs, rubs, or gallops,no edema - no significant varicosities ?Respiratory:  normal respiratory rate and pattern with no distress - normal breath sounds with no rales, rhonchi, wheezes or rubs ?MS: mild swelling noted above right patella - no tenderness to palpation - no edema or erythema of leg -  ?Skin: warm and dry, no rash  ? ?Health Maintenance Due  ?Topic Date Due  ? Hepatitis C Screening  Never done  ? Zoster Vaccines- Shingrix (2 of 2) 12/28/2016  ? Pneumonia Vaccine 5+ Years old (2 - PCV) 10/12/2017  ? Fecal DNA (Cologuard)  05/27/2019  ? COVID-19 Vaccine (4 - Booster for Pfizer series) 01/31/2020  ? MAMMOGRAM  02/25/2021  ? ? ?There are no preventive care reminders to display for this patient. ? ? ?Lab Results  ?Component Value Date  ? TSH 2.380 01/06/2021  ? ?Lab Results  ?Component Value Date  ? WBC 5.9 01/06/2021  ? HGB 13.5 01/06/2021  ? HCT 39.2  01/06/2021  ? MCV 86 01/06/2021  ? PLT 326 01/06/2021  ? ?Lab Results  ?Component Value Date  ? NA 139 01/06/2021  ? K 4.5 01/06/2021  ? CO2 25 01/06/2021  ? GLUCOSE 93 01/06/2021  ? BUN 11 01/06/2021  ? CREATININE 0.58 01/06/2021  ? BILITOT 0.4 01/06/2021  ? ALKPHOS 85  01/06/2021  ? AST 21 01/06/2021  ? ALT 19 01/06/2021  ? PROT 6.6 01/06/2021  ? ALBUMIN 4.5 01/06/2021  ? CALCIUM 9.3 01/06/2021  ? EGFR 96 01/06/2021  ? ?Lab Results  ?Component Value Date  ? CHOL 149 01/06/2021  ? ?Lab Results  ?Component Value Date  ? HDL 52 01/06/2021  ? ?Lab Results  ?Component Value Date  ? [place] 71 01/06/2021  ? ?Lab Results  ?Component Value Date  ? TRIG 154 (H) 01/06/2021  ? ?Lab Results  ?Component Value Date  ? CHOLHDL 2.9 01/06/2021  ? ?Lab Results  ?Component Value Date  ? HGBA1C 6.4 (H) 01/06/2021  ? ? ?   ?Assessment & Plan:  ? ?Problem List Items Addressed This Visit   ?None ?Visit Diagnoses   ? ? Acute pain of right knee    -  Primary -  knee sprain  ? Relevant Medications  ? meloxicam (MOBIC) 15 MG tablet  ? predniSONE (DELTASONE) 20 MG tablet ?Recommend ice and elevation  ? ?  ? ?Meds ordered this encounter  ?Medications  ? meloxicam (MOBIC) 15 MG tablet  ?  Sig: Take 1 tablet (15 mg total) by mouth daily.  ?  Dispense:  30 tablet  ?  Refill:  0  ?  Order Specific Question:   Supervising Provider  ?  AnswerRochel Brome [[id number]]  ? predniSONE (DELTASONE) 20 MG tablet  ?  Sig: Take 3 tablets (60 mg total) by mouth daily with breakfast for 3 days, THEN 2 tablets (40 mg total) daily with breakfast for 3 days, THEN 1 tablet (20 mg total) daily with breakfast for 3 days.  ?  Dispense:  18 tablet  ?  Refill:  0  ?  Order Specific Question:   Supervising Provider  ?  AnswerRochel Brome [[id number]]  ? ? ?No orders of the defined types were placed in this encounter. ?  ? ?Follow-up: Return if symptoms worsen or fail to improve. ? ?An After Visit Summary was printed and given to the patient. ? ?SARA R Rease Wence, PA-C ?[place] ?([phone]

## 2021-04-30 ENCOUNTER — Ambulatory Visit (INDEPENDENT_AMBULATORY_CARE_PROVIDER_SITE_OTHER): Payer: PPO | Admitting: Family Medicine

## 2021-04-30 ENCOUNTER — Encounter: Payer: Self-pay | Admitting: Family Medicine

## 2021-04-30 VITALS — BP 122/64 | HR 70 | Resp 18 | Ht 66.0 in | Wt 159.0 lb

## 2021-04-30 DIAGNOSIS — M25561 Pain in right knee: Secondary | ICD-10-CM | POA: Diagnosis not present

## 2021-04-30 LAB — COLOGUARD: COLOGUARD: NEGATIVE

## 2021-04-30 MED ORDER — TRIAMCINOLONE ACETONIDE 40 MG/ML IJ SUSP
80.0000 mg | Freq: Once | INTRAMUSCULAR | Status: AC
  Administered 2021-04-30: 80 mg via INTRA_ARTICULAR

## 2021-04-30 NOTE — Progress Notes (Signed)
? ?Acute Office Visit ? ?Subjective:  ? ? Patient ID: Virginia Montoya, female    DOB: January 21, 1950, 71 y.o.   MRN: [id number] ? ?Chief Complaint  ?Patient presents with  ? Knee pain  ? ? ?HPI: ?Patient is in today for right knee pain. Was seen 4/18 and given prednisone. Took three days but began coughing w/ hoarseness and stopped taking medication. Knee is worse since visit on 4/18. Rates pain 7/10. Has been taking acetaminophen 500 mg once or twice daily. Ibuprofen 200 mg 3 twice daily. Pain starts at knee and radiates down to ankle. Foot at times will become warm and tingle.  ? ?Past Medical History:  ?Diagnosis Date  ? Chronic insomnia   ? Depression   ? Gastroesophageal reflux disease   ? Hyperlipidemia   ? Osteoporosis   ? Precordial chest pain 11/17/2016  ? Prediabetes   ? ? ?Past Surgical History:  ?Procedure Laterality Date  ? CHOLECYSTECTOMY  1999  ? CORONARY CALCIUM SCORE/CORONARY CTA  01/2017  ? Coronary calcium score is 0.  Normal coronary origin with normal right dominance.  No evidence of CAD.  Most likely noncardiac chest pain.  ? SHOULDER SURGERY  2005  ? TONSILLECTOMY    ? TUBAL LIGATION  1977  ? ? ?Family History  ?Problem Relation Age of Onset  ? Diabetes Mother   ? Hyperlipidemia Mother   ? Emphysema Father   ? Diabetes Maternal Grandmother   ? Liver cancer Maternal Grandmother   ? ? ?Social History  ? ?Socioeconomic History  ? Marital status: Married  ?  Spouse name: Not on file  ? Number of children: 3  ? Years of education: 57  ? Highest education level: Associate degree: academic program  ?Occupational History  ? Occupation: Retired  ?Tobacco Use  ? Smoking status: Never  ? Smokeless tobacco: Never  ?Substance and Sexual Activity  ? Alcohol use: No  ? Drug use: No  ? Sexual activity: Not Currently  ?Other Topics Concern  ? Not on file  ?Social History Narrative  ? She is a married (remarried) mother of 3 with 4 grandchildren.  She has 3 great-grandchildren.  She previously worked at [employer] in [place].  She completed high school and beauty school.  ? She works outside a lot in the yard on gardening.  She does a lot of walking and exercises with walking for at least a half an hour a day 5 days a week.  ? wears sunscreen, brushes and flosses daily, see's dentist bi-annually, has smoke/carbon monoxide detectors, wears a seatbelt and practices gun safety  ? ?Social Determinants of Health  ? ?Financial Resource Strain: Not on file  ?Food Insecurity: No Food Insecurity  ? Worried About Charity fundraiser in the Last Year: Never true  ? Ran Out of Food in the Last Year: Never true  ?Transportation Needs: No Transportation Needs  ? Lack of Transportation (Medical): No  ? Lack of Transportation (Non-Medical): No  ?Physical Activity: Not on file  ?Stress: Not on file  ?Social Connections: Not on file  ?Intimate Partner Violence: Not on file  ? ? ?Outpatient Medications Prior to Visit  ?Medication Sig Dispense Refill  ? acetaminophen (TYLENOL) 500 MG tablet Take 500 mg every 6 (six) hours as needed by mouth.    ? cholecalciferol (VITAMIN D3) 25 MCG (1000 UNIT) tablet Take 1,000 Units by mouth 2 (two) times daily.    ? LORazepam (ATIVAN) 0.5 MG tablet TAKE  1 TABLET(0.5 MG) BY MOUTH EVERY 8 HOURS AS NEEDED FOR ANXIETY 90 tablet 0  ? meloxicam (MOBIC) 15 MG tablet Take 1 tablet (15 mg total) by mouth daily. 30 tablet 0  ? Multiple Vitamins-Minerals (PRESERVISION AREDS 2 PO) Take by mouth.    ? omeprazole (PRILOSEC) 20 MG capsule TAKE 1 CAPSULE(20 MG) BY MOUTH DAILY 90 capsule 3  ? pyridOXINE (VITAMIN B-6) 100 MG tablet Take 100 mg by mouth daily.    ? rosuvastatin (CRESTOR) 20 MG tablet TAKE 1 TABLET BY MOUTH AT NIGHT 90 tablet 0  ? venlafaxine XR (EFFEXOR-XR) 150 MG 24 hr capsule TAKE 1 CAPSULE(150 MG) BY MOUTH DAILY WITH BREAKFAST 90 capsule 1  ? vitamin B-12 (CYANOCOBALAMIN) 1000 MCG tablet Take 1,000 mcg by mouth daily.    ? zolpidem (AMBIEN) 10 MG tablet TAKE 1 TABLET BY MOUTH AT BEDTIME 30 tablet 3  ?  predniSONE (DELTASONE) 20 MG tablet Take 3 tablets (60 mg total) by mouth daily with breakfast for 3 days, THEN 2 tablets (40 mg total) daily with breakfast for 3 days, THEN 1 tablet (20 mg total) daily with breakfast for 3 days. 18 tablet 0  ? ?No facility-administered medications prior to visit.  ? ? ?Allergies  ?Allergen Reactions  ? Cephalexin Nausea And Vomiting  ?  ask  ? Alendronate   ?  Gi upset  ? Biaxin [Clarithromycin] Other (See Comments)  ?  CHEST PAIN  ? Sulfa Antibiotics   ?  Made her feel poorly.   ? ? ?Review of Systems  ?Constitutional:  Negative for appetite change, fatigue and fever.  ?HENT:  Negative for congestion, ear pain, sinus pressure and sore throat.   ?Eyes:  Negative for pain.  ?Respiratory:  Negative for cough, shortness of breath and wheezing.   ?Cardiovascular:  Negative for chest pain and palpitations.  ?Gastrointestinal:  Negative for abdominal pain, constipation, diarrhea, nausea and vomiting.  ?Genitourinary:  Negative for dysuria and frequency.  ?Musculoskeletal:  Negative for arthralgias, back pain, joint swelling and myalgias.  ?Skin:  Negative for rash.  ?Neurological:  Negative for dizziness, weakness and headaches.  ?Psychiatric/Behavioral:  Negative for dysphoric mood. The patient is not nervous/anxious.   ? ?   ?Objective:  ?  ?Physical Exam ?Vitals reviewed.  ?Constitutional:   ?   Appearance: Normal appearance.  ?Musculoskeletal:  ?   Comments: RIGHT KNEE EXAM ?Tender: posteriorly. ?Patellar apprehension: negative. ?Lateral ligament laxity: negative ?McMurray's signs: positive with medial movement.  ?Anterior drawer movement/Lachmans: negative ?Posterior drawer movement: negative ? ?LEFT KNEE EXAM ?Tender: negative ?Patellar apprehension: negative. ?Lateral ligament laxity: negative ?McMurray's signs: negative ?Anterior drawer movement/Lachmans: negative ?Posterior drawer movement: negative   ?Neurological:  ?   Mental Status: She is alert.  ? ?BP 122/64   Pulse 70    Resp 18   Ht $R'5\' 6"'Ru$  (1.676 m)   Wt 159 lb (72.1 kg)   BMI 25.66 kg/m?  ?Wt Readings from Last 3 Encounters:  ?04/30/21 159 lb (72.1 kg)  ?04/21/21 160 lb 12.8 oz (72.9 kg)  ?03/27/21 159 lb (72.1 kg)  ? ? ?Health Maintenance Due  ?Topic Date Due  ? Hepatitis C Screening  Never done  ? Zoster Vaccines- Shingrix (2 of 2) 12/28/2016  ? Pneumonia Vaccine 83+ Years old (2 - PCV) 10/12/2017  ? COVID-19 Vaccine (4 - Booster for Pfizer series) 01/31/2020  ? MAMMOGRAM  02/25/2021  ? ? ?There are no preventive care reminders to display for this patient. ? ? ?Lab  Results  ?Component Value Date  ? TSH 2.380 01/06/2021  ? ?Lab Results  ?Component Value Date  ? WBC 5.9 01/06/2021  ? HGB 13.5 01/06/2021  ? HCT 39.2 01/06/2021  ? MCV 86 01/06/2021  ? PLT 326 01/06/2021  ? ?Lab Results  ?Component Value Date  ? NA 139 01/06/2021  ? K 4.5 01/06/2021  ? CO2 25 01/06/2021  ? GLUCOSE 93 01/06/2021  ? BUN 11 01/06/2021  ? CREATININE 0.58 01/06/2021  ? BILITOT 0.4 01/06/2021  ? ALKPHOS 85 01/06/2021  ? AST 21 01/06/2021  ? ALT 19 01/06/2021  ? PROT 6.6 01/06/2021  ? ALBUMIN 4.5 01/06/2021  ? CALCIUM 9.3 01/06/2021  ? EGFR 96 01/06/2021  ? ?Lab Results  ?Component Value Date  ? CHOL 149 01/06/2021  ? ?Lab Results  ?Component Value Date  ? HDL 52 01/06/2021  ? ?Lab Results  ?Component Value Date  ? [place] 71 01/06/2021  ? ?Lab Results  ?Component Value Date  ? TRIG 154 (H) 01/06/2021  ? ?Lab Results  ?Component Value Date  ? CHOLHDL 2.9 01/06/2021  ? ?Lab Results  ?Component Value Date  ? HGBA1C 6.4 (H) 01/06/2021  ? ? ?   ?Assessment & Plan:  ? ?Problem List Items Addressed This Visit   ? ?  ? Other  ? Acute pain of right knee - Primary  ?  Risks were discussed including bleeding, infection, increase in sugars if diabetic, atrophy at site of injection, and increased pain.  After consent was obtained, using sterile technique the knee was prepped with alcohol.  The joint was entered and Kenalog 80 mg and 5 ml plain Lidocaine was then  injected and the needle withdrawn.  The procedure was well tolerated.   ?The patient is asked to continue to rest the joint for a few more days before resuming regular activities.  It may be more painful for th

## 2021-05-03 DIAGNOSIS — G8929 Other chronic pain: Secondary | ICD-10-CM | POA: Insufficient documentation

## 2021-05-03 DIAGNOSIS — M25561 Pain in right knee: Secondary | ICD-10-CM | POA: Insufficient documentation

## 2021-05-03 NOTE — Assessment & Plan Note (Addendum)
Risks were discussed including bleeding, infection, increase in sugars if diabetic, atrophy at site of injection, and increased pain.  After consent was obtained, using sterile technique the knee was prepped with alcohol.  The joint was entered and Kenalog 80 mg and 5 ml plain Lidocaine was then injected and the needle withdrawn.  The procedure was well tolerated.   ?The patient is asked to continue to rest the joint for a few more days before resuming regular activities.  It may be more painful for the first 1-2 days.  Watch for fever, or increased swelling or persistent pain in the joint. Call or return to clinic prn if such symptoms occur or there is failure to improve as anticipated. ? ?Get knee xray. ?

## 2021-05-04 ENCOUNTER — Telehealth: Payer: Self-pay

## 2021-05-04 NOTE — Telephone Encounter (Signed)
Left VM for patient to return call.  ? ?Was calling to give Knee xray results.  ?Results: "Small amount of fluid in knee jt. No OA. See if steroid inj helped. If not, I would recommend MRI knee. I am concerned about torn cartilage." ? ?Virginia Montoya 05/04/21 5:11 PM ? ? ?

## 2021-05-05 ENCOUNTER — Other Ambulatory Visit: Payer: Self-pay

## 2021-05-05 DIAGNOSIS — M25561 Pain in right knee: Secondary | ICD-10-CM

## 2021-05-07 ENCOUNTER — Ambulatory Visit (INDEPENDENT_AMBULATORY_CARE_PROVIDER_SITE_OTHER): Payer: PPO

## 2021-05-07 ENCOUNTER — Telehealth: Payer: Self-pay

## 2021-05-07 DIAGNOSIS — Z1231 Encounter for screening mammogram for malignant neoplasm of breast: Secondary | ICD-10-CM | POA: Diagnosis not present

## 2021-05-07 DIAGNOSIS — Z0000 Encounter for general adult medical examination without abnormal findings: Secondary | ICD-10-CM

## 2021-05-07 DIAGNOSIS — Z13820 Encounter for screening for osteoporosis: Secondary | ICD-10-CM | POA: Diagnosis not present

## 2021-05-07 NOTE — Telephone Encounter (Signed)
Patient wanted to know about starting prolia, I seen in chart were she had dexa scan in 2021, I order mammogram and dexa scan at her medicare wellness appointment today. Please advise about medication. ?

## 2021-05-07 NOTE — Progress Notes (Signed)
? ?Subjective:  ? Virginia Montoya is a 71 y.o. female who presents for Medicare Annual (Subsequent) preventive examination. ? ?I connected with  Virginia GalaRita Y Kruger on 05/07/21 by a audio enabled telemedicine application and verified that I am speaking with the correct person using two identifiers. ? ?Patient Location: Home ? ?Provider Location: Home Office ? ?I discussed the limitations of evaluation and management by telemedicine. The patient expressed understanding and agreed to proceed.  ? ?Review of Systems    ? ?  ?Patient is due for mammogram and dexa scan, both was order at today visit. ?   ?Objective:  ?  ?There were no vitals filed for this visit. ?There is no height or weight on file to calculate BMI. ? ? ?  12/03/2019  ? 10:39 AM 04/30/2019  ?  3:44 PM 03/06/2019  ? 11:42 AM  ?Advanced Directives  ?Does Patient Have a Medical Advance Directive? Yes Yes Yes  ?Type of Advance Directive Living will;Healthcare Power of State Street Corporationttorney Healthcare Power of [place];Living will Healthcare Power of [place];Living will  ? ? ?Current Medications (verified) ?Outpatient Encounter Medications as of 05/07/2021  ?Medication Sig  ? acetaminophen (TYLENOL) 500 MG tablet Take 500 mg every 6 (six) hours as needed by mouth.  ? cholecalciferol (VITAMIN D3) 25 MCG (1000 UNIT) tablet Take 1,000 Units by mouth 2 (two) times daily.  ? LORazepam (ATIVAN) 0.5 MG tablet TAKE 1 TABLET(0.5 MG) BY MOUTH EVERY 8 HOURS AS NEEDED FOR ANXIETY  ? meloxicam (MOBIC) 15 MG tablet Take 1 tablet (15 mg total) by mouth daily.  ? Multiple Vitamins-Minerals (PRESERVISION AREDS 2 PO) Take by mouth.  ? omeprazole (PRILOSEC) 20 MG capsule TAKE 1 CAPSULE(20 MG) BY MOUTH DAILY  ? pyridOXINE (VITAMIN B-6) 100 MG tablet Take 100 mg by mouth daily.  ? rosuvastatin (CRESTOR) 20 MG tablet TAKE 1 TABLET BY MOUTH AT NIGHT  ? venlafaxine XR (EFFEXOR-XR) 150 MG 24 hr capsule TAKE 1 CAPSULE(150 MG) BY MOUTH DAILY WITH BREAKFAST  ? vitamin B-12 (CYANOCOBALAMIN) 1000 MCG tablet  Take 1,000 mcg by mouth daily.  ? zolpidem (AMBIEN) 10 MG tablet TAKE 1 TABLET BY MOUTH AT BEDTIME  ? ?No facility-administered encounter medications on file as of 05/07/2021.  ? ? ?Allergies (verified) ?Cephalexin, Alendronate, Biaxin [clarithromycin], and Sulfa antibiotics  ? ?History: ?Past Medical History:  ?Diagnosis Date  ? Chronic insomnia   ? Depression   ? Gastroesophageal reflux disease   ? Hyperlipidemia   ? Osteoporosis   ? Precordial chest pain 11/17/2016  ? Prediabetes   ? ?Past Surgical History:  ?Procedure Laterality Date  ? CHOLECYSTECTOMY  1999  ? CORONARY CALCIUM SCORE/CORONARY CTA  01/2017  ? Coronary calcium score is 0.  Normal coronary origin with normal right dominance.  No evidence of CAD.  Most likely noncardiac chest pain.  ? SHOULDER SURGERY  2005  ? TONSILLECTOMY    ? TUBAL LIGATION  1977  ? ?Family History  ?Problem Relation Age of Onset  ? Diabetes Mother   ? Hyperlipidemia Mother   ? Emphysema Father   ? Diabetes Maternal Grandmother   ? Liver cancer Maternal Grandmother   ? ?Social History  ? ?Socioeconomic History  ? Marital status: Married  ?  Spouse name: Not on file  ? Number of children: 3  ? Years of education: 4012  ? Highest education level: Associate degree: academic program  ?Occupational History  ? Occupation: Retired  ?Tobacco Use  ? Smoking status: Never  ? Smokeless tobacco:  Never  ?Substance and Sexual Activity  ? Alcohol use: No  ? Drug use: No  ? Sexual activity: Not Currently  ?Other Topics Concern  ? Not on file  ?Social History Narrative  ? She is a married (remarried) mother of 3 with 4 grandchildren.  She has 3 great-grandchildren.  She previously worked at [employer] in the [employer].  She completed high school and beauty school.  ? She works outside a lot in the yard on gardening.  She does a lot of walking and exercises with walking for at least a half an hour a day 5 days a week.  ? wears sunscreen, brushes and flosses daily, see's dentist bi-annually, has  smoke/carbon monoxide detectors, wears a seatbelt and practices gun safety  ? ?Social Determinants of Health  ? ?Financial Resource Strain: Not on file  ?Food Insecurity: No Food Insecurity  ? Worried About Programme researcher, broadcasting/film/video in the Last Year: Never true  ? Ran Out of Food in the Last Year: Never true  ?Transportation Needs: No Transportation Needs  ? Lack of Transportation (Medical): No  ? Lack of Transportation (Non-Medical): No  ?Physical Activity: Not on file  ?Stress: Not on file  ?Social Connections: Not on file  ? ? ?Tobacco Counseling ?Counseling given: Not Answered ? ? ?Clinical Intake: ? ?  ? ?  ? ?  ? ?  ? ?  ? ?Diabetic?No ? ?  ? ?  ? ? ?Activities of Daily Living ?   ? View : No data to display.  ?  ?  ?  ? ? ? ?Patient Care Team: ?CoxFritzi Mandes, MD as PCP - General (Family Medicine) ?Marykay Lex, MD as PCP - Cardiology (Cardiology) ?Earvin Hansen, RPH (Inactive) as Pharmacist (Pharmacist) ? ?Indicate any recent Medical Services you may have received from other than [facility] providers in the past year (date may be approximate). ? ?   ?Assessment:  ? This is a routine wellness examination for Kadia. ? ?Hearing/Vision screen ?No results found. ? ?Dietary issues and exercise activities discussed: ?  ? ? Goals Addressed   ?None ?  ?Depression Screen ? ?  04/21/2021  ? 10:29 AM 09/08/2020  ?  4:59 PM 02/21/2020  ? 11:04 AM 12/03/2019  ? 11:05 AM 12/03/2019  ? 10:40 AM 08/29/2019  ?  3:54 PM 05/30/2019  ? 11:13 AM  ?PHQ 2/9 Scores  ?PHQ - 2 Score 0 0 0 0 0 5 2  ?PHQ- 9 Score  0    9 2  ?  ?Fall Risk ? ?  04/21/2021  ? 10:29 AM 11/10/2020  ? 11:27 AM 05/29/2020  ?  8:27 AM 02/21/2020  ? 11:04 AM 02/21/2020  ? 11:03 AM  ?Fall Risk   ?Falls in the past year? 0 0 1 0 0  ?Number falls in past yr: 0 0 0 0 0  ?Injury with Fall? 0 0 0 0 0  ?Risk for fall due to :  Impaired balance/gait     ?Follow up  Falls evaluation completed     ? ? ?FALL RISK PREVENTION PERTAINING TO THE HOME: ? ?Any stairs in or around the home? No  ?If  so, are there any without handrails? No  ?Home free of loose throw rugs in walkways, pet beds, electrical cords, etc? Yes  ?Adequate lighting in your home to reduce risk of falls? Yes  ? ?ASSISTIVE DEVICES UTILIZED TO PREVENT FALLS: ? ?Life alert? No  ?Use of a cane,  walker or w/c? Yes  ?Grab bars in the bathroom? No  ?Shower chair or bench in shower? No  ?Elevated toilet seat or a handicapped toilet? No  ? ?TIMED UP AND GO: ? ?Was the test performed? No .  ?Length of time to ambulate 10 feet: N/A sec.  ? ? ? ?Cognitive Function: ?  ?  ? ?  03/06/2019  ? 12:30 PM  ?6CIT Screen  ?What Year? 0 points  ?What month? 0 points  ?What time? 0 points  ?Count back from 20 0 points  ?Months in reverse 0 points  ?Repeat phrase 0 points  ?Total Score 0 points  ? ? ?Immunizations ?Immunization History  ?Administered Date(s) Administered  ? Fluad Quad(high Dose 65+) 10/08/2019, 09/05/2020  ? Influenza, High Dose Seasonal PF 09/06/2014, 11/14/2015  ? Influenza,inj,Quad PF,6+ Mos 10/02/2013, 10/12/2016, 10/21/2016  ? Influenza-Unspecified 11/02/2010, 11/01/2011, 09/19/2012, 10/11/2017  ? PFIZER(Purple Top)SARS-COV-2 Vaccination 05/01/2019, 05/24/2019, 12/06/2019  ? Pneumococcal Polysaccharide-23 11/14/2015, 10/12/2016  ? Td 10/23/2016  ? Zoster Recombinat (Shingrix) 11/02/2016  ? Zoster, Live 11/10/2013  ? ? ?TDAP status: Up to date ? ?Flu Vaccine status: Up to date ? ?Pneumococcal vaccine status: Up to date ? ?Covid-19 vaccine status: Completed vaccines ? ?Qualifies for Shingles Vaccine? Yes   ?Zostavax completed No   ?Shingrix Completed?: No.    Education has been provided regarding the importance of this vaccine. Patient has been advised to call insurance company to determine out of pocket expense if they have not yet received this vaccine. Advised may also receive vaccine at local pharmacy or Health Dept. Verbalized acceptance and understanding. ? ?Screening Tests ?Health Maintenance  ?Topic Date Due  ? Hepatitis C Screening   Never done  ? Zoster Vaccines- Shingrix (2 of 2) 12/28/2016  ? Pneumonia Vaccine 80+ Years old (2 - PCV) 10/12/2017  ? COVID-19 Vaccine (4 - Booster for Pfizer series) 01/31/2020  ? MAMMOGRAM  02/25/2021  ? INF

## 2021-05-12 NOTE — Telephone Encounter (Signed)
Spoke with patient, verbalized understanding and had no questions at this time.  

## 2021-05-13 ENCOUNTER — Encounter: Payer: Self-pay | Admitting: Family Medicine

## 2021-05-14 DIAGNOSIS — H353221 Exudative age-related macular degeneration, left eye, with active choroidal neovascularization: Secondary | ICD-10-CM | POA: Diagnosis not present

## 2021-05-15 ENCOUNTER — Other Ambulatory Visit: Payer: Self-pay | Admitting: Family Medicine

## 2021-05-15 DIAGNOSIS — K219 Gastro-esophageal reflux disease without esophagitis: Secondary | ICD-10-CM

## 2021-05-18 ENCOUNTER — Other Ambulatory Visit: Payer: Self-pay | Admitting: Physician Assistant

## 2021-05-18 DIAGNOSIS — M25561 Pain in right knee: Secondary | ICD-10-CM

## 2021-05-31 ENCOUNTER — Other Ambulatory Visit: Payer: Self-pay | Admitting: Nurse Practitioner

## 2021-05-31 DIAGNOSIS — F411 Generalized anxiety disorder: Secondary | ICD-10-CM

## 2021-06-02 ENCOUNTER — Ambulatory Visit (INDEPENDENT_AMBULATORY_CARE_PROVIDER_SITE_OTHER): Payer: PPO | Admitting: Physician Assistant

## 2021-06-02 ENCOUNTER — Encounter: Payer: Self-pay | Admitting: Physician Assistant

## 2021-06-02 VITALS — BP 142/82 | HR 83 | Temp 97.3°F | Ht 66.0 in | Wt 161.0 lb

## 2021-06-02 DIAGNOSIS — M7989 Other specified soft tissue disorders: Secondary | ICD-10-CM

## 2021-06-02 DIAGNOSIS — T63461A Toxic effect of venom of wasps, accidental (unintentional), initial encounter: Secondary | ICD-10-CM | POA: Diagnosis not present

## 2021-06-02 MED ORDER — TRIAMCINOLONE ACETONIDE 40 MG/ML IJ SUSP
60.0000 mg | Freq: Once | INTRAMUSCULAR | Status: AC
  Administered 2021-06-02: 60 mg via INTRAMUSCULAR

## 2021-06-02 MED ORDER — AZITHROMYCIN 250 MG PO TABS: ORAL_TABLET | ORAL | 0 refills | Status: AC

## 2021-06-02 NOTE — Progress Notes (Signed)
Acute Office Visit  Subjective:    Patient ID: Virginia Montoya, female    DOB: May 14, 1950, 71 y.o.   MRN: [id number]  Chief Complaint  Patient presents with   Bee sting    Right thigh/ happened 5/20    HPI: Patient is in today for complaints of leg swelling after getting stung by yellow jacket 10 days ago - states leg is itching and has moderate erythema surrounding where stinger was left in leg (husband did remove) Denies fever/malaise  Past Medical History:  Diagnosis Date   Chronic insomnia    Depression    Gastroesophageal reflux disease    Hyperlipidemia    Osteoporosis    Precordial chest pain 11/17/2016   Prediabetes     Past Surgical History:  Procedure Laterality Date   CHOLECYSTECTOMY  1999   CORONARY CALCIUM SCORE/CORONARY CTA  01/2017   Coronary calcium score is 0.  Normal coronary origin with normal right dominance.  No evidence of CAD.  Most likely noncardiac chest pain.   SHOULDER SURGERY  2005   TONSILLECTOMY     TUBAL LIGATION  1977    Family History  Problem Relation Age of Onset   Diabetes Mother    Hyperlipidemia Mother    Emphysema Father    Diabetes Maternal Grandmother    Liver cancer Maternal Grandmother     Social History   Socioeconomic History   Marital status: Married    Spouse name: Not on file   Number of children: 3   Years of education: 12   Highest education level: Associate degree: academic program  Occupational History   Occupation: Retired  Tobacco Use   Smoking status: Never   Smokeless tobacco: Never  Substance and Sexual Activity   Alcohol use: No   Drug use: No   Sexual activity: Not Currently  Other Topics Concern   Not on file  Social History Narrative   She is a married (remarried) mother of 3 with 4 grandchildren.  She has 3 great-grandchildren.  She previously worked at [employer] in [place].  She completed high school and beauty school.   She works outside a lot in the yard on gardening.  She  does a lot of walking and exercises with walking for at least a half an hour a day 5 days a week.   wears sunscreen, brushes and flosses daily, see's dentist bi-annually, has smoke/carbon monoxide detectors, wears a seatbelt and practices gun safety   Social Determinants of Health   Financial Resource Strain: Low Risk    Difficulty of Paying Living Expenses: Not hard at all  Food Insecurity: No Food Insecurity   Worried About Charity fundraiser in the Last Year: Never true   [place] in the Last Year: Never true  Transportation Needs: No Transportation Needs   Lack of Transportation (Medical): No   Lack of Transportation (Non-Medical): No  Physical Activity: Insufficiently Active   Days of Exercise per Week: 5 days   Minutes of Exercise per Session: 20 min  Stress: No Stress Concern Present   Feeling of Stress : Only a little  Social Connections: Moderately Integrated   Frequency of Communication with Friends and Family: More than three times a week   Frequency of Social Gatherings with Friends and Family: Three times a week   Attends Religious Services: More than 4 times per year   Active Member of Clubs or Organizations: No   Attends Archivist Meetings:  Never   Marital Status: Married  Human resources officer Violence: Not At Risk   Fear of Current or Ex-Partner: No   Emotionally Abused: No   Physically Abused: No   Sexually Abused: No    Outpatient Medications Prior to Visit  Medication Sig Dispense Refill   acetaminophen (TYLENOL) 500 MG tablet Take 500 mg every 6 (six) hours as needed by mouth.     cholecalciferol (VITAMIN D3) 25 MCG (1000 UNIT) tablet Take 1,000 Units by mouth 2 (two) times daily.     LORazepam (ATIVAN) 0.5 MG tablet TAKE 1 TABLET(0.5 MG) BY MOUTH EVERY 8 HOURS AS NEEDED FOR ANXIETY 90 tablet 0   meloxicam (MOBIC) 15 MG tablet TAKE 1 TABLET(15 MG) BY MOUTH DAILY 30 tablet 0   Multiple Vitamins-Minerals (PRESERVISION AREDS 2 PO) Take by mouth.      omeprazole (PRILOSEC) 20 MG capsule TAKE 1 CAPSULE(20 MG) BY MOUTH DAILY 90 capsule 3   pyridOXINE (VITAMIN B-6) 100 MG tablet Take 100 mg by mouth daily.     rosuvastatin (CRESTOR) 20 MG tablet TAKE 1 TABLET BY MOUTH AT NIGHT 90 tablet 0   venlafaxine XR (EFFEXOR-XR) 150 MG 24 hr capsule TAKE 1 CAPSULE(150 MG) BY MOUTH DAILY WITH BREAKFAST 90 capsule 1   vitamin B-12 (CYANOCOBALAMIN) 1000 MCG tablet Take 1,000 mcg by mouth daily.     zolpidem (AMBIEN) 10 MG tablet TAKE 1 TABLET BY MOUTH AT BEDTIME 30 tablet 3   No facility-administered medications prior to visit.    Allergies  Allergen Reactions   Cephalexin Nausea And Vomiting    ask   Alendronate     Gi upset   Biaxin [Clarithromycin] Other (See Comments)    CHEST PAIN   Sulfa Antibiotics     Made her feel poorly.     Review of Systems CONSTITUTIONAL: Negative for chills, fatigue, fever, E/N/T: no trouble swallowing CARDIOVASCULAR: Negative for chest pain,  RESPIRATORY: Negative for recent cough and dyspnea.  INTEGUMENTARY: see HPI      Objective:   PHYSICAL EXAM:   VS: BP (!) 142/82 (BP Location: Left Arm, Patient Position: Sitting)   Pulse 83   Temp (!) 97.3 F (36.3 C) (Temporal)   Ht _0  (1.676 m)   Wt 161 lb (73 kg)   SpO2 97%   BMI 25.99 kg/m   GEN: Well nourished, well developed, in no acute distress  Cardiac: RRR; no murmurs, Respiratory:  normal respiratory rate and pattern with no distress - GI: normal bowel sounds, no masses or tenderness Skin: inner right leg with noted yellow jacket sting - wound noted -- leg with erythema and mild edema extending from lesion     Health Maintenance Due  Topic Date Due   Hepatitis C Screening  Never done   Zoster Vaccines- Shingrix (2 of 2) 12/28/2016   Pneumonia Vaccine 61+ Years old (2 - PCV) 10/12/2017   COVID-19 Vaccine (4 - Booster for Pfizer series) 01/31/2020   MAMMOGRAM  02/25/2021    There are no preventive care reminders to display for this  patient.   Lab Results  Component Value Date   TSH 2.380 01/06/2021   Lab Results  Component Value Date   WBC 5.9 01/06/2021   HGB 13.5 01/06/2021   HCT 39.2 01/06/2021   MCV 86 01/06/2021   PLT 326 01/06/2021   Lab Results  Component Value Date   NA 139 01/06/2021   K 4.5 01/06/2021   CO2 25 01/06/2021   GLUCOSE 93  01/06/2021   BUN 11 01/06/2021   CREATININE 0.58 01/06/2021   BILITOT 0.4 01/06/2021   ALKPHOS 85 01/06/2021   AST 21 01/06/2021   ALT 19 01/06/2021   PROT 6.6 01/06/2021   ALBUMIN 4.5 01/06/2021   CALCIUM 9.3 01/06/2021   EGFR 96 01/06/2021   Lab Results  Component Value Date   CHOL 149 01/06/2021   Lab Results  Component Value Date   HDL 52 01/06/2021   Lab Results  Component Value Date   LDLCALC 71 01/06/2021   Lab Results  Component Value Date   TRIG 154 (H) 01/06/2021   Lab Results  Component Value Date   CHOLHDL 2.9 01/06/2021   Lab Results  Component Value Date   HGBA1C 6.4 (H) 01/06/2021       Assessment & Plan:   Problem List Items Addressed This Visit   None Visit Diagnoses     Yellow jacket sting, accidental or unintentional, initial encounter    -  Primary   Relevant Medications   triamcinolone acetonide (KENALOG-40) injection 60 mg (Start on 06/02/2021 10:45 AM)   azithromycin (ZITHROMAX) 250 MG tablet Ice therapy   Right leg swelling       Relevant Medications   triamcinolone acetonide (KENALOG-40) injection 60 mg (Start on 06/02/2021 10:45 AM)   azithromycin (ZITHROMAX) 250 MG tablet      Meds ordered this encounter  Medications   triamcinolone acetonide (KENALOG-40) injection 60 mg   azithromycin (ZITHROMAX) 250 MG tablet    Sig: Take 2 tablets on day 1, then 1 tablet daily on days 2 through 5    Dispense:  6 tablet    Refill:  0    Order Specific Question:   Supervising Provider    Answer:   Rochel Brome [[id number]]    No orders of the defined types were placed in this encounter.    Follow-up: Return if  symptoms worsen or fail to improve.  An After Visit Summary was printed and given to the patient.  [facility] Flock [facility] [phone]

## 2021-06-11 DIAGNOSIS — H353221 Exudative age-related macular degeneration, left eye, with active choroidal neovascularization: Secondary | ICD-10-CM | POA: Diagnosis not present

## 2021-06-15 ENCOUNTER — Other Ambulatory Visit: Payer: Self-pay | Admitting: Physician Assistant

## 2021-06-15 DIAGNOSIS — M25561 Pain in right knee: Secondary | ICD-10-CM

## 2021-06-18 ENCOUNTER — Other Ambulatory Visit: Payer: Self-pay | Admitting: Nurse Practitioner

## 2021-06-18 DIAGNOSIS — F411 Generalized anxiety disorder: Secondary | ICD-10-CM

## 2021-06-18 DIAGNOSIS — Z1231 Encounter for screening mammogram for malignant neoplasm of breast: Secondary | ICD-10-CM | POA: Diagnosis not present

## 2021-06-18 DIAGNOSIS — M810 Age-related osteoporosis without current pathological fracture: Secondary | ICD-10-CM | POA: Diagnosis not present

## 2021-06-29 ENCOUNTER — Other Ambulatory Visit: Payer: Self-pay

## 2021-06-29 DIAGNOSIS — R928 Other abnormal and inconclusive findings on diagnostic imaging of breast: Secondary | ICD-10-CM

## 2021-06-30 ENCOUNTER — Telehealth: Payer: Self-pay | Admitting: Family Medicine

## 2021-06-30 ENCOUNTER — Other Ambulatory Visit: Payer: Self-pay

## 2021-06-30 DIAGNOSIS — Z0000 Encounter for general adult medical examination without abnormal findings: Secondary | ICD-10-CM

## 2021-06-30 DIAGNOSIS — Z1231 Encounter for screening mammogram for malignant neoplasm of breast: Secondary | ICD-10-CM

## 2021-07-10 NOTE — Progress Notes (Unsigned)
Subjective:  Patient ID: Virginia Montoya, female    DOB: [birth year]  Age: 71 y.o. MRN: [id number]  Chief Complaint  Patient presents with   Hyperlipidemia   Depression   HPI Osteoporosis. Vitamin D 1000 U qd. Patient eats cheese but very little milk.  Intolerant to calcium due to constipation.  We are trying to get Evenity approved for the patient.  Her recent bone density worsened on her T-scores greater than -3. GAD/Depression: on effexor xr 150 mg cone daily in am. Lorazepam 0.5 mg half pill to 1 pill as needed during the day and patient takes 1 pill in evening. Takes ambien at night for sleep.  Prediabetes: Tries to eat healthy.  Walking for exercise.  Hyperlipidemia. On crestor 20 mg daily. GERD. On omeprazole 20 mg daily and pepcid 20 mg daily.  Well-controlled. Macular degeneration: sees eye doctor.  Hyperlipidemia: on crestor 20 mg before bed.  Eats fairly healthy.  Current Outpatient Medications on File Prior to Visit  Medication Sig Dispense Refill   acetaminophen (TYLENOL) 500 MG tablet Take 500 mg every 6 (six) hours as needed by mouth.     cholecalciferol (VITAMIN D3) 25 MCG (1000 UNIT) tablet Take 1,000 Units by mouth 2 (two) times daily.     LORazepam (ATIVAN) 0.5 MG tablet TAKE 1 TABLET(0.5 MG) BY MOUTH EVERY 8 HOURS AS NEEDED FOR ANXIETY 90 tablet 0   meloxicam (MOBIC) 15 MG tablet TAKE 1 TABLET(15 MG) BY MOUTH DAILY 30 tablet 0   Multiple Vitamins-Minerals (PRESERVISION AREDS 2 PO) Take by mouth.     omeprazole (PRILOSEC) 20 MG capsule TAKE 1 CAPSULE(20 MG) BY MOUTH DAILY 90 capsule 3   pyridOXINE (VITAMIN B-6) 100 MG tablet Take 100 mg by mouth daily.     rosuvastatin (CRESTOR) 20 MG tablet TAKE 1 TABLET BY MOUTH AT NIGHT 90 tablet 0   venlafaxine XR (EFFEXOR-XR) 150 MG 24 hr capsule TAKE 1 CAPSULE(150 MG) BY MOUTH DAILY WITH BREAKFAST 90 capsule 1   vitamin B-12 (CYANOCOBALAMIN) 1000 MCG tablet Take 1,000 mcg by mouth daily.     zolpidem (AMBIEN) 10 MG tablet TAKE 1  TABLET BY MOUTH AT BEDTIME 30 tablet 3   No current facility-administered medications on file prior to visit.   Past Medical History:  Diagnosis Date   Chronic insomnia    Depression    Gastroesophageal reflux disease    Hyperlipidemia    Osteoporosis    Precordial chest pain 11/17/2016   Prediabetes    Past Surgical History:  Procedure Laterality Date   CHOLECYSTECTOMY  1999   CORONARY CALCIUM SCORE/CORONARY CTA  01/2017   Coronary calcium score is 0.  Normal coronary origin with normal right dominance.  No evidence of CAD.  Most likely noncardiac chest pain.   SHOULDER SURGERY  2005   TONSILLECTOMY     TUBAL LIGATION  1977    Family History  Problem Relation Age of Onset   Diabetes Mother    Hyperlipidemia Mother    Emphysema Father    Diabetes Maternal Grandmother    Liver cancer Maternal Grandmother    Social History   Socioeconomic History   Marital status: Married    Spouse name: Not on file   Number of children: 3   Years of education: 12   Highest education level: Associate degree: academic program  Occupational History   Occupation: Retired  Tobacco Use   Smoking status: Never   Smokeless tobacco: Never  Substance and Sexual Activity  Alcohol use: No   Drug use: No   Sexual activity: Not Currently  Other Topics Concern   Not on file  Social History Narrative   She is a married (remarried) mother of 3 with 4 grandchildren.  She has 3 great-grandchildren.  She previously worked at [employer] in the [employer].  She completed high school and beauty school.   She works outside a lot in the yard on gardening.  She does a lot of walking and exercises with walking for at least a half an hour a day 5 days a week.   wears sunscreen, brushes and flosses daily, see's dentist bi-annually, has smoke/carbon monoxide detectors, wears a seatbelt and practices gun safety   Social Determinants of Health   Financial Resource Strain: Low Risk  (05/07/2021)   Overall  Financial Resource Strain (CARDIA)    Difficulty of Paying Living Expenses: Not hard at all  Food Insecurity: No Food Insecurity (05/07/2021)   Hunger Vital Sign    Worried About Running Out of Food in the Last Year: Never true    Ran Out of Food in the Last Year: Never true  Transportation Needs: No Transportation Needs (05/07/2021)   PRAPARE - Administrator, Civil Service (Medical): No    Lack of Transportation (Non-Medical): No  Physical Activity: Insufficiently Active (05/07/2021)   Exercise Vital Sign    Days of Exercise per Week: 5 days    Minutes of Exercise per Session: 20 min  Stress: No Stress Concern Present (05/07/2021)   [employer] of Occupational Health - Occupational Stress Questionnaire    Feeling of Stress : Only a little  Social Connections: Moderately Integrated (05/07/2021)   Social Connection and Isolation Panel [NHANES]    Frequency of Communication with Friends and Family: More than three times a week    Frequency of Social Gatherings with Friends and Family: Three times a week    Attends Religious Services: More than 4 times per year    Active Member of Clubs or Organizations: No    Attends Banker Meetings: Never    Marital Status: Married    Review of Systems  Constitutional:  Negative for appetite change, fatigue and fever.  HENT:  Negative for congestion, ear pain, sinus pressure and sore throat.   Respiratory:  Negative for cough, chest tightness, shortness of breath and wheezing.   Cardiovascular:  Negative for chest pain and palpitations.  Gastrointestinal:  Negative for abdominal pain, constipation, diarrhea, nausea and vomiting.  Genitourinary:  Negative for dysuria and hematuria.  Musculoskeletal:  Negative for arthralgias, back pain, joint swelling and myalgias.  Skin:  Negative for rash.  Neurological:  Negative for dizziness, weakness and headaches.  Psychiatric/Behavioral:  Negative for dysphoric mood. The patient is not  nervous/anxious.      Objective:  BP (!) 152/82   Pulse 88   Temp 97.8 F (36.6 C) (Oral)   Ht 5\' 6"  (1.676 m)   Wt 160 lb (72.6 kg)   SpO2 98%   BMI 25.82 kg/m      07/13/2021    9:53 AM 07/13/2021    9:14 AM 06/02/2021   10:25 AM  BP/Weight  Systolic BP 152 158 142  Diastolic BP 82 76 82  Wt. (Lbs)  160 161  BMI  25.82 kg/m2 25.99 kg/m2    Physical Exam Vitals reviewed.  Constitutional:      Appearance: Normal appearance. She is normal weight.  Cardiovascular:     Rate  and Rhythm: Normal rate and regular rhythm.     Heart sounds: Normal heart sounds.  Pulmonary:     Effort: Pulmonary effort is normal.     Breath sounds: Normal breath sounds.  Abdominal:     General: Abdomen is flat. Bowel sounds are normal.     Palpations: Abdomen is soft.  Neurological:     Mental Status: She is alert and oriented to person, place, and time.  Psychiatric:        Mood and Affect: Mood normal.        Behavior: Behavior normal.     Diabetic Foot Exam - Simple   No data filed      Lab Results  Component Value Date   WBC 5.9 01/06/2021   HGB 13.5 01/06/2021   HCT 39.2 01/06/2021   PLT 326 01/06/2021   GLUCOSE 93 01/06/2021   CHOL 149 01/06/2021   TRIG 154 (H) 01/06/2021   HDL 52 01/06/2021   LDLCALC 71 01/06/2021   ALT 19 01/06/2021   AST 21 01/06/2021   NA 139 01/06/2021   K 4.5 01/06/2021   CL 101 01/06/2021   CREATININE 0.58 01/06/2021   BUN 11 01/06/2021   CO2 25 01/06/2021   TSH 2.380 01/06/2021   HGBA1C 6.4 (H) 01/06/2021   MICROALBUR 10 05/29/2020      Assessment & Plan:   Problem List Items Addressed This Visit       Digestive   Gastroesophageal reflux disease    Continue omeprazole 20 mg daily         Nervous and Auditory   Idiopathic peripheral neuropathy    Continue B12 and B6.        Musculoskeletal and Integument   Age-related osteoporosis without current pathological fracture    Sent message to Selena Batten to determine if evenity has  been approved.         Other   Mixed hyperlipidemia    Well controlled.  No changes to medicines. Continue crestor 20 mg before bed.  Continue to work on eating a healthy diet and exercise.  Labs drawn today.        Relevant Orders   CBC With Diff/Platelet   Comprehensive metabolic panel   Lipid panel   Prediabetes - Primary    Recommend continue to work on eating healthy diet and exercise.       Relevant Orders   Hemoglobin A1c   Mild recurrent major depression (HCC)    The current medical regimen is effective;  continue present plan and medications.       GAD (generalized anxiety disorder)    The current medical regimen is effective;  continue present plan and medications. Continue effexor xr 150 mg qam.  Using lorazepam 0.5 mg 1/2 daily as needed and one at night.       Macular degeneration    Continue to see [facility].       Other Visit Diagnoses     Moderate recurrent major depression (HCC)       Age-related osteoporosis with current pathological fracture, initial encounter       Relevant Orders   VITAMIN D 25 Hydroxy (Vit-D Deficiency, Fractures)      Follow-up: Return in about 3 months (around 10/13/2021) for chronic fasting, awv after 12/09/2021.  An After Visit Summary was printed and given to the patient.  I,Lauren M Auman,acting as a scribe for Blane Ohara, MD.,have documented all relevant documentation on the behalf of Blane Ohara, MD,as  directed by  Blane Ohara, MD while in the presence of Blane Ohara, MD.   Blane Ohara, MD [facility] [phone]

## 2021-07-13 ENCOUNTER — Encounter: Payer: Self-pay | Admitting: Family Medicine

## 2021-07-13 ENCOUNTER — Other Ambulatory Visit: Payer: Self-pay

## 2021-07-13 ENCOUNTER — Ambulatory Visit (INDEPENDENT_AMBULATORY_CARE_PROVIDER_SITE_OTHER): Payer: PPO | Admitting: Family Medicine

## 2021-07-13 VITALS — BP 152/82 | HR 88 | Temp 97.8°F | Ht 66.0 in | Wt 160.0 lb

## 2021-07-13 DIAGNOSIS — M810 Age-related osteoporosis without current pathological fracture: Secondary | ICD-10-CM

## 2021-07-13 DIAGNOSIS — R7303 Prediabetes: Secondary | ICD-10-CM

## 2021-07-13 DIAGNOSIS — G609 Hereditary and idiopathic neuropathy, unspecified: Secondary | ICD-10-CM

## 2021-07-13 DIAGNOSIS — F330 Major depressive disorder, recurrent, mild: Secondary | ICD-10-CM | POA: Diagnosis not present

## 2021-07-13 DIAGNOSIS — N6082 Other benign mammary dysplasias of left breast: Secondary | ICD-10-CM | POA: Diagnosis not present

## 2021-07-13 DIAGNOSIS — F411 Generalized anxiety disorder: Secondary | ICD-10-CM

## 2021-07-13 DIAGNOSIS — M8000XA Age-related osteoporosis with current pathological fracture, unspecified site, initial encounter for fracture: Secondary | ICD-10-CM

## 2021-07-13 DIAGNOSIS — H353132 Nonexudative age-related macular degeneration, bilateral, intermediate dry stage: Secondary | ICD-10-CM | POA: Diagnosis not present

## 2021-07-13 DIAGNOSIS — E782 Mixed hyperlipidemia: Secondary | ICD-10-CM | POA: Diagnosis not present

## 2021-07-13 DIAGNOSIS — F331 Major depressive disorder, recurrent, moderate: Secondary | ICD-10-CM

## 2021-07-13 DIAGNOSIS — K219 Gastro-esophageal reflux disease without esophagitis: Secondary | ICD-10-CM | POA: Diagnosis not present

## 2021-07-13 DIAGNOSIS — N6002 Solitary cyst of left breast: Secondary | ICD-10-CM | POA: Diagnosis not present

## 2021-07-13 DIAGNOSIS — R928 Other abnormal and inconclusive findings on diagnostic imaging of breast: Secondary | ICD-10-CM | POA: Diagnosis not present

## 2021-07-13 NOTE — Assessment & Plan Note (Signed)
The current medical regimen is effective;  continue present plan and medications. Continue effexor xr 150 mg qam.  Using lorazepam 0.5 mg 1/2 daily as needed and one at night.

## 2021-07-13 NOTE — Assessment & Plan Note (Signed)
Continue to see [facility].

## 2021-07-13 NOTE — Assessment & Plan Note (Signed)
-  Continue omeprazole 20 mg daily

## 2021-07-13 NOTE — Assessment & Plan Note (Signed)
Sent message to Selena Batten to determine if evenity has been approved.

## 2021-07-13 NOTE — Assessment & Plan Note (Signed)
Continue B12 and B6.

## 2021-07-13 NOTE — Assessment & Plan Note (Signed)
The current medical regimen is effective;  continue present plan and medications.  

## 2021-07-13 NOTE — Assessment & Plan Note (Signed)
>>  ASSESSMENT AND PLAN FOR MILD RECURRENT MAJOR DEPRESSION (HCC) WRITTEN ON 07/13/2021  9:08 PM BY Conita Amenta, MD  The current medical regimen is effective;  continue present plan and medications.

## 2021-07-13 NOTE — Assessment & Plan Note (Signed)
Well controlled.  No changes to medicines. Continue crestor 20 mg before bed.  Continue to work on eating a healthy diet and exercise.  Labs drawn today.   

## 2021-07-13 NOTE — Assessment & Plan Note (Signed)
Recommend continue to work on eating healthy diet and exercise.  

## 2021-07-14 LAB — HEMOGLOBIN A1C
Est. average glucose Bld gHb Est-mCnc: 128 mg/dL
Hgb A1c MFr Bld: 6.1 % — ABNORMAL HIGH (ref 4.8–5.6)

## 2021-07-14 LAB — CBC WITH DIFF/PLATELET
Basophils Absolute: 0 10*3/uL (ref 0.0–0.2)
Basos: 0 %
EOS (ABSOLUTE): 0.1 10*3/uL (ref 0.0–0.4)
Eos: 1 %
Hematocrit: 44.6 % (ref 34.0–46.6)
Hemoglobin: 14.8 g/dL (ref 11.1–15.9)
Immature Grans (Abs): 0 10*3/uL (ref 0.0–0.1)
Immature Granulocytes: 1 %
Lymphocytes Absolute: 1.9 10*3/uL (ref 0.7–3.1)
Lymphs: 29 %
MCH: 29.8 pg (ref 26.6–33.0)
MCHC: 33.2 g/dL (ref 31.5–35.7)
MCV: 90 fL (ref 79–97)
Monocytes Absolute: 0.5 10*3/uL (ref 0.1–0.9)
Monocytes: 7 %
Neutrophils Absolute: 4 10*3/uL (ref 1.4–7.0)
Neutrophils: 62 %
Platelets: 340 10*3/uL (ref 150–450)
RBC: 4.97 x10E6/uL (ref 3.77–5.28)
RDW: 13.7 % (ref 11.7–15.4)
WBC: 6.4 10*3/uL (ref 3.4–10.8)

## 2021-07-14 LAB — COMPREHENSIVE METABOLIC PANEL
ALT: 26 IU/L (ref 0–32)
AST: 25 IU/L (ref 0–40)
Albumin/Globulin Ratio: 1.8 (ref 1.2–2.2)
Albumin: 4.4 g/dL (ref 3.8–4.8)
Alkaline Phosphatase: 85 IU/L (ref 44–121)
BUN/Creatinine Ratio: 20 (ref 12–28)
BUN: 11 mg/dL (ref 8–27)
Bilirubin Total: 0.5 mg/dL (ref 0.0–1.2)
CO2: 24 mmol/L (ref 20–29)
Calcium: 9.4 mg/dL (ref 8.7–10.3)
Chloride: 99 mmol/L (ref 96–106)
Creatinine, Ser: 0.54 mg/dL — ABNORMAL LOW (ref 0.57–1.00)
Globulin, Total: 2.4 g/dL (ref 1.5–4.5)
Glucose: 103 mg/dL — ABNORMAL HIGH (ref 70–99)
Potassium: 4.3 mmol/L (ref 3.5–5.2)
Sodium: 138 mmol/L (ref 134–144)
Total Protein: 6.8 g/dL (ref 6.0–8.5)
eGFR: 98 mL/min/{1.73_m2} (ref >59)

## 2021-07-14 LAB — CARDIOVASCULAR RISK ASSESSMENT

## 2021-07-14 LAB — LIPID PANEL
Chol/HDL Ratio: 2.1 ratio (ref 0.0–4.4)
Cholesterol, Total: 151 mg/dL (ref 100–199)
HDL: 72 mg/dL (ref >39)
LDL Chol Calc (NIH): 63 mg/dL (ref 0–99)
Triglycerides: 87 mg/dL (ref 0–149)
VLDL Cholesterol Cal: 16 mg/dL (ref 5–40)

## 2021-07-14 LAB — VITAMIN D 25 HYDROXY (VIT D DEFICIENCY, FRACTURES): Vit D, 25-Hydroxy: 48.2 ng/mL (ref 30.0–100.0)

## 2021-07-16 ENCOUNTER — Encounter: Payer: Self-pay | Admitting: Family Medicine

## 2021-07-16 ENCOUNTER — Other Ambulatory Visit: Payer: Self-pay | Admitting: Family Medicine

## 2021-07-16 DIAGNOSIS — H353221 Exudative age-related macular degeneration, left eye, with active choroidal neovascularization: Secondary | ICD-10-CM | POA: Diagnosis not present

## 2021-07-21 ENCOUNTER — Telehealth: Payer: Self-pay

## 2021-07-21 NOTE — Telephone Encounter (Signed)
Per [employer] benefit verification out of pocket cost for Evenity will be apx $450 per month.  OK per Dr Sedalia Muta to check Prolia benefits.  Prolia benefit verification shows patient will be responsible for 20% + $25.00 admin fee.  Patient aware and agrees to start.

## 2021-07-27 ENCOUNTER — Ambulatory Visit: Payer: PPO

## 2021-07-27 NOTE — Progress Notes (Signed)
Virginia Montoya comes in for recheck of her blood pressure. She denies headaches or dizzines.Marland Kitchen

## 2021-07-30 ENCOUNTER — Telehealth: Payer: Self-pay

## 2021-07-30 NOTE — Progress Notes (Signed)
Chronic Care Management Pharmacy Assistant   Name: Virginia Montoya  MRN: [id number] DOB: [birth year]   Reason for Encounter: General Adherence Call    Recent office visits:  07/13/21 Blane Ohara MD. Seen for routine visit. No med changes.  06/02/21 Marianne Sofia PA-C. Seen for Bee Sting. Started on Azithromycin.   05/07/21 Gwynneth Aliment CMA. Medicare Annual Wellness. No med changes.  04/30/21 Blane Ohara MD. Seen for knee pain. No med changes.  04/21/21 Marianne Sofia PA-C. Seen for knee pain. Started on Meloxicam 15mg  and Prednisone 20mg .   03/27/21 NP. Seen for cough. Started on Amoxicillin 875mg  for 10 days.  Recent consult visits:  None  Hospital visits:  None  Medications: Outpatient Encounter Medications as of 07/30/2021  Medication Sig   acetaminophen (TYLENOL) 500 MG tablet Take 500 mg every 6 (six) hours as needed by mouth.   cholecalciferol (VITAMIN D3) 25 MCG (1000 UNIT) tablet Take 1,000 Units by mouth 2 (two) times daily.   LORazepam (ATIVAN) 0.5 MG tablet TAKE 1 TABLET(0.5 MG) BY MOUTH EVERY 8 HOURS AS NEEDED FOR ANXIETY   meloxicam (MOBIC) 15 MG tablet TAKE 1 TABLET(15 MG) BY MOUTH DAILY   Multiple Vitamins-Minerals (PRESERVISION AREDS 2 PO) Take by mouth.   omeprazole (PRILOSEC) 20 MG capsule TAKE 1 CAPSULE(20 MG) BY MOUTH DAILY   pyridOXINE (VITAMIN B-6) 100 MG tablet Take 100 mg by mouth daily.   rosuvastatin (CRESTOR) 20 MG tablet TAKE 1 TABLET BY MOUTH AT NIGHT   venlafaxine XR (EFFEXOR-XR) 150 MG 24 hr capsule TAKE 1 CAPSULE(150 MG) BY MOUTH DAILY WITH BREAKFAST   vitamin B-12 (CYANOCOBALAMIN) 1000 MCG tablet Take 1,000 mcg by mouth daily.   zolpidem (AMBIEN) 10 MG tablet TAKE 1 TABLET BY MOUTH AT BEDTIME   No facility-administered encounter medications on file as of 07/30/2021.    Contacted for General Review Call   Chart Review:  Have there been any documented new, changed, or discontinued medications since last visit? Yes   Has there been any documented recent hospitalizations or ED visits since last visit with Clinical Pharmacist? No Brief Summary (including medication and/or Diagnosis changes):   Adherence Review:  Does the Clinical Pharmacist Assistant have access to adherence rates? Yes Adherence rates for STAR metric medications (List medication(s)/day supply/ last 2 fill dates). Rosuvastatin 05/09/21-02/07/21 90ds Adherence rates for medications indicated for disease state being reviewed (List medication(s)/day supply/ last 2 fill dates). Does the patient have >5 day gap between last estimated fill dates for any of the above medications or other medication gaps? No Reason for medication gaps.   Disease State Questions:  Able to connect with Patient? Yes Did patient have any problems with their health recently? No Note problems and Concerns: Have you had any admissions or emergency room visits or worsening of your condition(s) since last visit? No Details of ED visit, hospital visit and/or worsening condition(s): Have you had any visits with new specialists or providers since your last visit? No Explain: Have you had any new health care problem(s) since your last visit? No New problem(s) reported: Have you run out of any of your medications since you last spoke with clinical pharmacist? No What caused you to run out of your medications? Are there any medications you are not taking as prescribed? No What kept you from taking your medications as prescribed? Are you having any issues or side effects with your medications? No Note of issues or side effects: Do you have  any other health concerns or questions you want to discuss with your Clinical Pharmacist before your next visit? No Note additional concerns and questions from Patient. Are there any health concerns that you feel we can do a better job addressing? No Note Patient's response. Are you having any problems with any of the following since the  last visit: (select all that apply)  None  Details: 12. Any falls since last visit? No  Details: 13. Any increased or uncontrolled pain since last visit? No  Details: 14. Next visit Type: office       Visit with:Dr. [facility]        Date:10/29/21        Time:9:00am  15. Additional Details? No    Roxana Hires, [facility] Programme researcher, broadcasting/film/video  [phone]

## 2021-08-07 ENCOUNTER — Other Ambulatory Visit: Payer: Self-pay | Admitting: Family Medicine

## 2021-08-13 ENCOUNTER — Ambulatory Visit (INDEPENDENT_AMBULATORY_CARE_PROVIDER_SITE_OTHER): Payer: PPO

## 2021-08-13 DIAGNOSIS — H353221 Exudative age-related macular degeneration, left eye, with active choroidal neovascularization: Secondary | ICD-10-CM | POA: Diagnosis not present

## 2021-08-13 DIAGNOSIS — M810 Age-related osteoporosis without current pathological fracture: Secondary | ICD-10-CM

## 2021-08-13 MED ORDER — DENOSUMAB 60 MG/ML ~~LOC~~ SOSY
60.0000 mg | PREFILLED_SYRINGE | Freq: Once | SUBCUTANEOUS | Status: AC
  Administered 2021-08-13: 60 mg via SUBCUTANEOUS

## 2021-09-10 ENCOUNTER — Ambulatory Visit (INDEPENDENT_AMBULATORY_CARE_PROVIDER_SITE_OTHER): Payer: PPO | Admitting: Nurse Practitioner

## 2021-09-10 ENCOUNTER — Telehealth: Payer: Self-pay | Admitting: Family Medicine

## 2021-09-10 ENCOUNTER — Encounter: Payer: Self-pay | Admitting: Nurse Practitioner

## 2021-09-10 VITALS — BP 136/72 | HR 81 | Temp 97.7°F | Ht 66.0 in | Wt 162.0 lb

## 2021-09-10 DIAGNOSIS — R4182 Altered mental status, unspecified: Secondary | ICD-10-CM | POA: Diagnosis not present

## 2021-09-10 DIAGNOSIS — R4781 Slurred speech: Secondary | ICD-10-CM

## 2021-09-10 NOTE — Patient Instructions (Addendum)
We will call you with head CT appointment  Call 911, seek emergency medical assistance if symptoms return    Transient Ischemic Attack A transient ischemic attack (TIA) causes the same symptoms as a stroke, but the symptoms go away quickly. A TIA happens when blood flow to the brain is blocked. Having a TIA means you may be at risk for a stroke. A TIA is a medical emergency. What are the causes? A TIA is caused by a blocked artery in the head or neck. This means the brain does not get the blood supply it needs. A blockage can be caused by: Fatty buildup in an artery in the head or neck. A blood clot. A tear in an artery. Irritation and swelling (inflammation) of an artery. Sometimes the cause is not known. What increases the risk? Certain things may make you more likely to have a TIA. Some of these are things that you can change, such as: Being very overweight. Using products that have nicotine or tobacco. Taking birth control pills. Not being active. Drinking too much alcohol. Using drugs. Health conditions that may increase your risk include: High blood pressure. High cholesterol. Diabetes. Heart disease. A heartbeat that is not regular (atrial fibrillation). Sickle cell disease. Sleep problems (sleep apnea). Long-term diseases that cause irritation and swelling. Problems with blood clotting. Other risk factors include: Being over the age of 60. Being female. Having a family history of stroke. Having had blood clots, stroke, TIA, or heart attack in the past. Having a history of high blood pressure when pregnant (preeclampsia). Very bad headaches (migraines). What are the signs or symptoms? The symptoms of a TIA are like those of a stroke. They can include: Weakness or loss of feeling in your face, arm, or leg. This often happens on one side of your body. Trouble walking. Trouble moving your arms or legs. Trouble talking or understanding what people are saying. Problems  with how you see. Feeling dizzy. Feeling confused. Loss of balance or coordination. Feeling like you may vomit (nausea) or you vomit. Having a very bad headache. If you can, note what time you started to have symptoms. Tell your doctor. How is this treated? The goal of treatment is to lower the risk for a stroke. This may include: Changes to diet and lifestyle, such as getting regular exercise and stopping smoking. Taking medicines to: Thin the blood. Lower blood pressure. Lower cholesterol. Treating other health conditions, such as diabetes. If testing shows that an artery in your brain is narrow, your doctor may recommend a procedure to: Take the blockage out of your artery (carotid endarterectomy). Open or widen an artery in your neck (carotid angioplasty and stenting). Follow these instructions at home: Medicines Take over-the-counter and prescription medicines only as told by your doctor. If you were told to take aspirin or another medicine to thin your blood, take it exactly as told by your doctor. Taking too much of the medicine can cause bleeding. Taking too little of the medicine may not work to treat the problem. Eating and drinking  Eat 5 or more servings of fruits and vegetables each day. Follow instructions from your doctor about your diet. You may need to follow a certain diet to help lower your risk of a stroke. You may need to: Eat a diet that is low in fat and salt. Eat foods with a lot of fiber. Limit carbohydrates and sugar. If you drink alcohol: Limit how much you have to: 0-1 drink a day for  women who are not pregnant. 0-2 drinks a day for men. Know how much alcohol is in a drink. In the [place] one drink equals one 12 oz bottle of beer ( ), one 5 oz glass of wine ( ), or one 1 oz glass of hard liquor (80mL). General instructions Keep a healthy weight. Try to get at least 30 minutes of exercise on most days. Get treatment if you have sleep  problems. Do not smoke or use any products that contain nicotine or tobacco. If you need help quitting, ask your doctor. Do not use drugs. Keep all follow-up visits. Where to find more information American Stroke Association: www.stroke.org Get help right away if: You have chest pain. You have a heartbeat that is not regular. You have any signs of a stroke. "BE FAST" is an easy way to remember the main warning signs: B - Balance. Dizziness, sudden trouble walking, or loss of balance. E - Eyes. Trouble seeing or a change in how you see. F - Face. Sudden weakness or loss of feeling of the face. The face or eyelid may droop on one side. A - Arms. Weakness or loss of feeling in an arm. This happens all of a sudden and most often on one side of the body. S - Speech. Sudden trouble speaking, slurred speech, or trouble understanding what people say. T - Time. Time to call emergency services. Write down what time symptoms started. You have other signs of a stroke, such as: A sudden, very bad headache with no known cause. Feeling like you may vomit. Vomiting. A seizure. These symptoms may be an emergency. Get help right away. Call your local emergency services (911 in the [place] Do not wait to see if the symptoms will go away. Do not drive yourself to the hospital. Summary A transient ischemic attack (TIA) happens when an artery in the head or neck is blocked. This causes the same symptoms as a stroke. The symptoms go away quickly. A TIA is a medical emergency. Get help right away, even if your symptoms go away. Having a TIA means that you may be at risk for a stroke. Taking medicines and making diet and lifestyle changes can help to prevent a stroke. This information is not intended to replace advice given to you by your health care provider. Make sure you discuss any questions you have with your health care provider. Document Revised: 04/22/2021 Document Reviewed: 07/17/2019 Elsevier Patient  Education  2023 [employer].

## 2021-09-10 NOTE — Progress Notes (Signed)
Acute Office Visit  Subjective:    Patient ID: Virginia Montoya, female    DOB: 19-Oct-1950, 71 y.o.   MRN: [id number]  Chief Complaint  Patient presents with   Slurred speech/headache        HPI Patient is in today for evaluation of slurred speech, dizziness, unsteady gait, tingling around bilateral arms/head, and numbness around head. Episode occurred four days ago. Pt had difficulty forming words. States episode lasted 5 minutes. Pt's spouse states episode lasted 30 minutes. All symptoms have subsided. Pt had residual headache and fatigue after episode. Pt took Ibuprofen 800 mg for treatment. Pt and spouse deny facial drooping, upper extremity weakness, or LOC. Pt did not seek emergency medical care. Medical history of anxiety, prediabetes, and hyperlipidemia.  Past Medical History:  Diagnosis Date   Chronic insomnia    Depression    Gastroesophageal reflux disease    Hyperlipidemia    Osteoporosis    Precordial chest pain 11/17/2016   Prediabetes     Past Surgical History:  Procedure Laterality Date   CHOLECYSTECTOMY  1999   CORONARY CALCIUM SCORE/CORONARY CTA  01/2017   Coronary calcium score is 0.  Normal coronary origin with normal right dominance.  No evidence of CAD.  Most likely noncardiac chest pain.   SHOULDER SURGERY  2005   TONSILLECTOMY     TUBAL LIGATION  1977    Family History  Problem Relation Age of Onset   Diabetes Mother    Hyperlipidemia Mother    Emphysema Father    Diabetes Maternal Grandmother    Liver cancer Maternal Grandmother     Social History   Socioeconomic History   Marital status: Married    Spouse name: Not on file   Number of children: 3   Years of education: 12   Highest education level: Associate degree: academic program  Occupational History   Occupation: Retired  Tobacco Use   Smoking status: Never   Smokeless tobacco: Never  Substance and Sexual Activity   Alcohol use: No   Drug use: No   Sexual activity: Not  Currently  Other Topics Concern   Not on file  Social History Narrative   She is a married (remarried) mother of 3 with 4 grandchildren.  She has 3 great-grandchildren.  She previously worked at [employer] in [place].  She completed high school and beauty school.   She works outside a lot in the yard on gardening.  She does a lot of walking and exercises with walking for at least a half an hour a day 5 days a week.   wears sunscreen, brushes and flosses daily, see's dentist bi-annually, has smoke/carbon monoxide detectors, wears a seatbelt and practices gun safety   Social Determinants of Health   Financial Resource Strain: Low Risk  (05/07/2021)   Overall Financial Resource Strain (CARDIA)    Difficulty of Paying Living Expenses: Not hard at all  Food Insecurity: No Food Insecurity (05/07/2021)   Hunger Vital Sign    Worried About Running Out of Food in the Last Year: Never true    [place] in the Last Year: Never true  Transportation Needs: No Transportation Needs (05/07/2021)   PRAPARE - Hydrologist (Medical): No    Lack of Transportation (Non-Medical): No  Physical Activity: Insufficiently Active (05/07/2021)   Exercise Vital Sign    Days of Exercise per Week: 5 days    Minutes of Exercise per Session: 20  min  Stress: No Stress Concern Present (05/07/2021)   [employer] of Occupational Health - Occupational Stress Questionnaire    Feeling of Stress : Only a little  Social Connections: Moderately Integrated (05/07/2021)   Social Connection and Isolation Panel [NHANES]    Frequency of Communication with Friends and Family: More than three times a week    Frequency of Social Gatherings with Friends and Family: Three times a week    Attends Religious Services: More than 4 times per year    Active Member of Clubs or Organizations: No    Attends Banker Meetings: Never    Marital Status: Married  Catering manager Violence: Not At  Risk (05/07/2021)   Humiliation, Afraid, Rape, and Kick questionnaire    Fear of Current or Ex-Partner: No    Emotionally Abused: No    Physically Abused: No    Sexually Abused: No    Outpatient Medications Prior to Visit  Medication Sig Dispense Refill   acetaminophen (TYLENOL) 500 MG tablet Take 500 mg every 6 (six) hours as needed by mouth.     cholecalciferol (VITAMIN D3) 25 MCG (1000 UNIT) tablet Take 1,000 Units by mouth 2 (two) times daily.     LORazepam (ATIVAN) 0.5 MG tablet TAKE 1 TABLET(0.5 MG) BY MOUTH EVERY 8 HOURS AS NEEDED FOR ANXIETY 90 tablet 0   meloxicam (MOBIC) 15 MG tablet TAKE 1 TABLET(15 MG) BY MOUTH DAILY 30 tablet 0   Multiple Vitamins-Minerals (PRESERVISION AREDS 2 PO) Take by mouth.     omeprazole (PRILOSEC) 20 MG capsule TAKE 1 CAPSULE(20 MG) BY MOUTH DAILY 90 capsule 3   pyridOXINE (VITAMIN B-6) 100 MG tablet Take 100 mg by mouth daily.     rosuvastatin (CRESTOR) 20 MG tablet TAKE 1 TABLET BY MOUTH AT NIGHT 90 tablet 0   venlafaxine XR (EFFEXOR-XR) 150 MG 24 hr capsule TAKE 1 CAPSULE(150 MG) BY MOUTH DAILY WITH BREAKFAST 90 capsule 1   vitamin B-12 (CYANOCOBALAMIN) 1000 MCG tablet Take 1,000 mcg by mouth daily.     zolpidem (AMBIEN) 10 MG tablet TAKE 1 TABLET BY MOUTH AT BEDTIME 30 tablet 3   No facility-administered medications prior to visit.    Allergies  Allergen Reactions   Cephalexin Nausea And Vomiting    ask   Alendronate     Gi upset   Biaxin [Clarithromycin] Other (See Comments)    CHEST PAIN   Sulfa Antibiotics     Made her feel poorly.     Review of Systems  Constitutional:  Negative for appetite change, fatigue and fever.  HENT:  Negative for congestion, ear pain, sinus pressure and sore throat.   Respiratory:  Negative for cough, chest tightness, shortness of breath and wheezing.   Cardiovascular:  Negative for chest pain and palpitations.  Gastrointestinal:  Negative for abdominal pain, constipation, diarrhea, nausea and vomiting.   Genitourinary:  Negative for dysuria and hematuria.  Musculoskeletal:  Negative for arthralgias, back pain, joint swelling and myalgias.  Skin:  Negative for rash.  Neurological:  Positive for speech difficulty (with episode on sunday) and headaches. Negative for dizziness and weakness.  Psychiatric/Behavioral:  Negative for dysphoric mood. The patient is not nervous/anxious.        Objective:    Physical Exam Vitals reviewed.  Constitutional:      Appearance: Normal appearance.  HENT:     Right Ear: Tympanic membrane normal.     Left Ear: Tympanic membrane normal.     Mouth/Throat:  Mouth: Mucous membranes are moist.  Eyes:     Pupils: Pupils are equal, round, and reactive to light.  Cardiovascular:     Rate and Rhythm: Normal rate and regular rhythm.     Heart sounds: Normal heart sounds.  Pulmonary:     Effort: Pulmonary effort is normal.     Breath sounds: Normal breath sounds.  Abdominal:     General: Bowel sounds are normal.     Palpations: Abdomen is soft.  Musculoskeletal:        General: Normal range of motion.     Cervical back: Neck supple.  Skin:    General: Skin is warm and dry.     Capillary Refill: Capillary refill takes less than 2 seconds.  Neurological:     General: No focal deficit present.     Mental Status: She is alert and oriented to person, place, and time.     Sensory: Sensation is intact.     Motor: No seizure activity or pronator drift.     Coordination: Romberg sign negative. Coordination normal. Finger-Nose-Finger Test and Heel to [facility] Test normal. Rapid alternating movements normal.     Gait: Gait is intact.  Psychiatric:        Mood and Affect: Mood normal.        Behavior: Behavior normal.     BP 136/72 (BP Location: Left Arm, Patient Position: Sitting)   Pulse 81   Temp 97.7 F (36.5 C) (Temporal)   Ht $R'5\' 6"'PN$  (1.676 m)   Wt 162 lb (73.5 kg)   SpO2 97%   BMI 26.15 kg/m  Wt Readings from Last 3 Encounters:  09/10/21 162 lb  (73.5 kg)  07/13/21 160 lb (72.6 kg)  06/02/21 161 lb (73 kg)    Health Maintenance Due  Topic Date Due   Hepatitis C Screening  Never done   Zoster Vaccines- Shingrix (2 of 2) 12/28/2016   Pneumonia Vaccine 59+ Years old (2 - PCV) 10/12/2017   COVID-19 Vaccine (4 - Pfizer risk series) 01/31/2020   INFLUENZA VACCINE  08/04/2021    Lab Results  Component Value Date   TSH 2.380 01/06/2021   Lab Results  Component Value Date   WBC 6.4 07/13/2021   HGB 14.8 07/13/2021   HCT 44.6 07/13/2021   MCV 90 07/13/2021   PLT 340 07/13/2021   Lab Results  Component Value Date   NA 138 07/13/2021   K 4.3 07/13/2021   CO2 24 07/13/2021   GLUCOSE 103 (H) 07/13/2021   BUN 11 07/13/2021   CREATININE 0.54 (L) 07/13/2021   BILITOT 0.5 07/13/2021   ALKPHOS 85 07/13/2021   AST 25 07/13/2021   ALT 26 07/13/2021   PROT 6.8 07/13/2021   ALBUMIN 4.4 07/13/2021   CALCIUM 9.4 07/13/2021   EGFR 98 07/13/2021   Lab Results  Component Value Date   CHOL 151 07/13/2021   Lab Results  Component Value Date   HDL 72 07/13/2021   Lab Results  Component Value Date   LDLCALC 63 07/13/2021   Lab Results  Component Value Date   TRIG 87 07/13/2021   Lab Results  Component Value Date   CHOLHDL 2.1 07/13/2021   Lab Results  Component Value Date   HGBA1C 6.1 (H) 07/13/2021         Assessment & Plan:   1. Slurred speech - CT HEAD WO CONTRAST (5MM); Future  2. Altered mental status, unspecified altered mental status type - CT HEAD WO CONTRAST (  5MM); Future   We will call you with head CT appointment  Call 911, seek emergency medical assistance if symptoms return   I,Lauren M Auman,acting as a scribe for [employer], NP.,have documented all relevant documentation on the behalf of Rip Harbour, NP,as directed by  Rip Harbour, NP while in the presence of Rip Harbour, NP.   Oleta Mouse, CMA  I, Rip Harbour, NP, have reviewed all documentation for this  visit. The documentation on 09/10/21 for the exam, diagnosis, procedures, and orders are all accurate and complete.   SignedJerrell Belfast, DNP 09/10/21 at 1:30 pm

## 2021-09-10 NOTE — Telephone Encounter (Signed)
   Virginia Montoya has been scheduled for the following appointment:  WHAT: HEAD CT WHERE: [place] DATE: 09/11/21 TIME: 10:00 AM CHECK-IN  Patient has been made aware.

## 2021-09-11 DIAGNOSIS — I672 Cerebral atherosclerosis: Secondary | ICD-10-CM | POA: Diagnosis not present

## 2021-09-11 DIAGNOSIS — G459 Transient cerebral ischemic attack, unspecified: Secondary | ICD-10-CM | POA: Diagnosis not present

## 2021-09-15 ENCOUNTER — Other Ambulatory Visit: Payer: Self-pay | Admitting: Family Medicine

## 2021-09-18 ENCOUNTER — Ambulatory Visit (INDEPENDENT_AMBULATORY_CARE_PROVIDER_SITE_OTHER): Payer: PPO | Admitting: Family Medicine

## 2021-09-18 VITALS — Temp 100.9°F | Wt 163.0 lb

## 2021-09-18 DIAGNOSIS — J069 Acute upper respiratory infection, unspecified: Secondary | ICD-10-CM

## 2021-09-18 DIAGNOSIS — U071 COVID-19: Secondary | ICD-10-CM

## 2021-09-18 MED ORDER — MOLNUPIRAVIR EUA 200MG CAPSULE: 4.0000 | ORAL_CAPSULE | Freq: Two times a day (BID) | ORAL | 0 refills | Status: AC

## 2021-09-18 NOTE — Progress Notes (Signed)
Acute Office Visit  Subjective:    Patient ID: Virginia Montoya, female    DOB: September 23, 1950, 71 y.o.   MRN: [id number]  Chief Complaint  Patient presents with   Fever   Cough   Nasal Congestion    HPI: Patient is in today for runny nose, fever and cough.  She reports that her symptoms started on Wednesday with runny nose and scratchy sore throat. Her symptoms have progressed with headache, weakness and increased cough but no shortness of breath or chest pain reported.    Past Medical History:  Diagnosis Date   Chronic insomnia    Depression    Gastroesophageal reflux disease    Hyperlipidemia    Osteoporosis    Precordial chest pain 11/17/2016   Prediabetes     Past Surgical History:  Procedure Laterality Date   CHOLECYSTECTOMY  1999   CORONARY CALCIUM SCORE/CORONARY CTA  01/2017   Coronary calcium score is 0.  Normal coronary origin with normal right dominance.  No evidence of CAD.  Most likely noncardiac chest pain.   SHOULDER SURGERY  2005   TONSILLECTOMY     TUBAL LIGATION  1977    Family History  Problem Relation Age of Onset   Diabetes Mother    Hyperlipidemia Mother    Emphysema Father    Diabetes Maternal Grandmother    Liver cancer Maternal Grandmother     Social History   Socioeconomic History   Marital status: Married    Spouse name: Not on file   Number of children: 3   Years of education: 12   Highest education level: Associate degree: academic program  Occupational History   Occupation: Retired  Tobacco Use   Smoking status: Never   Smokeless tobacco: Never  Substance and Sexual Activity   Alcohol use: No   Drug use: No   Sexual activity: Not Currently  Other Topics Concern   Not on file  Social History Narrative   She is a married (remarried) mother of 3 with 4 grandchildren.  She has 3 great-grandchildren.  She previously worked at [employer] in [place]  She completed high school and beauty school.   She works outside a lot in  the yard on gardening.  She does a lot of walking and exercises with walking for at least a half an hour a day 5 days a week.   wears sunscreen, brushes and flosses daily, see's dentist bi-annually, has smoke/carbon monoxide detectors, wears a seatbelt and practices gun safety   Social Determinants of Health   Financial Resource Strain: Low Risk  (05/07/2021)   Overall Financial Resource Strain (CARDIA)    Difficulty of Paying Living Expenses: Not hard at all  Food Insecurity: No Food Insecurity (05/07/2021)   Hunger Vital Sign    Worried About Running Out of Food in the Last Year: Never true    [place] in the Last Year: Never true  Transportation Needs: No Transportation Needs (05/07/2021)   PRAPARE - Hydrologist (Medical): No    Lack of Transportation (Non-Medical): No  Physical Activity: Insufficiently Active (05/07/2021)   Exercise Vital Sign    Days of Exercise per Week: 5 days    Minutes of Exercise per Session: 20 min  Stress: No Stress Concern Present (05/07/2021)   [place]    Feeling of Stress : Only a little  Social Connections: Moderately Integrated (05/07/2021)  Social Connection and Isolation Panel [NHANES]    Frequency of Communication with Friends and Family: More than three times a week    Frequency of Social Gatherings with Friends and Family: Three times a week    Attends Religious Services: More than 4 times per year    Active Member of Clubs or Organizations: No    Attends Banker Meetings: Never    Marital Status: Married  Catering manager Violence: Not At Risk (05/07/2021)   Humiliation, Afraid, Rape, and Kick questionnaire    Fear of Current or Ex-Partner: No    Emotionally Abused: No    Physically Abused: No    Sexually Abused: No    Outpatient Medications Prior to Visit  Medication Sig Dispense Refill   acetaminophen (TYLENOL) 500 MG tablet Take  500 mg every 6 (six) hours as needed by mouth.     cholecalciferol (VITAMIN D3) 25 MCG (1000 UNIT) tablet Take 1,000 Units by mouth 2 (two) times daily.     meloxicam (MOBIC) 15 MG tablet TAKE 1 TABLET(15 MG) BY MOUTH DAILY 30 tablet 0   Multiple Vitamins-Minerals (PRESERVISION AREDS 2 PO) Take by mouth.     omeprazole (PRILOSEC) 20 MG capsule TAKE 1 CAPSULE(20 MG) BY MOUTH DAILY 90 capsule 3   pyridOXINE (VITAMIN B-6) 100 MG tablet Take 100 mg by mouth daily.     rosuvastatin (CRESTOR) 20 MG tablet TAKE 1 TABLET BY MOUTH AT NIGHT 90 tablet 0   venlafaxine XR (EFFEXOR-XR) 150 MG 24 hr capsule TAKE 1 CAPSULE(150 MG) BY MOUTH DAILY WITH BREAKFAST 90 capsule 1   vitamin B-12 (CYANOCOBALAMIN) 1000 MCG tablet Take 1,000 mcg by mouth daily.     zolpidem (AMBIEN) 10 MG tablet TAKE 1 TABLET BY MOUTH AT BEDTIME 30 tablet 0   LORazepam (ATIVAN) 0.5 MG tablet TAKE 1 TABLET(0.5 MG) BY MOUTH EVERY 8 HOURS AS NEEDED FOR ANXIETY 90 tablet 0   No facility-administered medications prior to visit.    Allergies  Allergen Reactions   Cephalexin Nausea And Vomiting    ask   Alendronate     Gi upset   Biaxin [Clarithromycin] Other (See Comments)    CHEST PAIN   Sulfa Antibiotics     Made her feel poorly.     Review of Systems  Constitutional:  Positive for fatigue and fever. Negative for chills.  HENT:  Positive for congestion and ear pain.   Respiratory:  Positive for shortness of breath.   Cardiovascular:  Negative for chest pain.  Gastrointestinal:  Negative for abdominal pain and nausea.  Neurological:  Positive for weakness and headaches.       Objective:   Exam done at car window.   Physical Exam Vitals reviewed.  Constitutional:      Appearance: Normal appearance.  Cardiovascular:     Rate and Rhythm: Normal rate and regular rhythm.     Heart sounds: Normal heart sounds.  Pulmonary:     Effort: Pulmonary effort is normal.     Breath sounds: Normal breath sounds.  Neurological:      Mental Status: She is alert.     Temp (!) 100.9 F (38.3 C)   Wt 163 lb (73.9 kg)   BMI 26.31 kg/m  Wt Readings from Last 3 Encounters:  09/18/21 163 lb (73.9 kg)  09/10/21 162 lb (73.5 kg)  07/13/21 160 lb (72.6 kg)    Health Maintenance Due  Topic Date Due   Hepatitis C Screening  Never done  Zoster Vaccines- Shingrix (2 of 2) 12/28/2016   Pneumonia Vaccine 81+ Years old (2 - PCV) 10/12/2017   COVID-19 Vaccine (4 - Pfizer risk series) 01/31/2020   INFLUENZA VACCINE  08/04/2021    There are no preventive care reminders to display for this patient.   Lab Results  Component Value Date   TSH 2.380 01/06/2021   Lab Results  Component Value Date   WBC 6.4 07/13/2021   HGB 14.8 07/13/2021   HCT 44.6 07/13/2021   MCV 90 07/13/2021   PLT 340 07/13/2021   Lab Results  Component Value Date   NA 138 07/13/2021   K 4.3 07/13/2021   CO2 24 07/13/2021   GLUCOSE 103 (H) 07/13/2021   BUN 11 07/13/2021   CREATININE 0.54 (L) 07/13/2021   BILITOT 0.5 07/13/2021   ALKPHOS 85 07/13/2021   AST 25 07/13/2021   ALT 26 07/13/2021   PROT 6.8 07/13/2021   ALBUMIN 4.4 07/13/2021   CALCIUM 9.4 07/13/2021   EGFR 98 07/13/2021   Lab Results  Component Value Date   CHOL 151 07/13/2021   Lab Results  Component Value Date   HDL 72 07/13/2021   Lab Results  Component Value Date   LDLCALC 63 07/13/2021   Lab Results  Component Value Date   TRIG 87 07/13/2021   Lab Results  Component Value Date   CHOLHDL 2.1 07/13/2021   Lab Results  Component Value Date   HGBA1C 6.1 (H) 07/13/2021       Assessment & Plan:   Problem List Items Addressed This Visit       Respiratory   Upper respiratory tract infection due to COVID-19 virus - Primary    Your COVID test is positive. You should remain isolated and quarantine for at least 5 days from start of symptoms. You must be feeling better and be fever free without any fever reducers for at least 24 hours as well. You should  wear a mask at all times when out of your home or around others for 5 days after leaving isolation.  Your household contacts should be tested as well as work contacts. If you feel worse or have increasing shortness of breath, you should be seen in person at urgent care or the emergency room.  COVID-19 home recommendations: Recommend plenty of rest. Recommend drink plenty of fluids. Recommend eat 3 meals a day. Recommend over-the-counter fever medications. Recommend over-the-counter cough and congestion medications.       Relevant Orders   POC COVID-19 BinaxNow (Completed)   Meds ordered this encounter  Medications   molnupiravir EUA (LAGEVRIO) 200 mg CAPS capsule    Sig: Take 4 capsules (800 mg total) by mouth 2 (two) times daily for 5 days.    Dispense:  40 capsule    Refill:  0    Orders Placed This Encounter  Procedures   POC COVID-19 BinaxNow     Follow-up: No follow-ups on file.  An After Visit Summary was printed and given to the patient.  Rochel Brome, MD [facility] [phone]

## 2021-09-25 ENCOUNTER — Other Ambulatory Visit: Payer: Self-pay | Admitting: Nurse Practitioner

## 2021-09-25 DIAGNOSIS — F411 Generalized anxiety disorder: Secondary | ICD-10-CM

## 2021-09-27 ENCOUNTER — Encounter: Payer: Self-pay | Admitting: Family Medicine

## 2021-09-27 DIAGNOSIS — J069 Acute upper respiratory infection, unspecified: Secondary | ICD-10-CM | POA: Insufficient documentation

## 2021-09-27 DIAGNOSIS — U071 COVID-19: Secondary | ICD-10-CM | POA: Insufficient documentation

## 2021-09-27 LAB — POC COVID19 BINAXNOW: SARS Coronavirus 2 Ag: NEGATIVE

## 2021-09-27 NOTE — Assessment & Plan Note (Signed)
Your COVID test is positive. You should remain isolated and quarantine for at least 5 days from start of symptoms. You must be feeling better and be fever free without any fever reducers for at least 24 hours as well. You should wear a mask at all times when out of your home or around others for 5 days after leaving isolation.  Your household contacts should be tested as well as work contacts. If you feel worse or have increasing shortness of breath, you should be seen in person at urgent care or the emergency room.  COVID-19 home recommendations: Recommend plenty of rest. Recommend drink plenty of fluids. Recommend eat 3 meals a day. Recommend over-the-counter fever medications. Recommend over-the-counter cough and congestion medications.

## 2021-09-29 ENCOUNTER — Ambulatory Visit (INDEPENDENT_AMBULATORY_CARE_PROVIDER_SITE_OTHER): Payer: PPO | Admitting: Nurse Practitioner

## 2021-09-29 ENCOUNTER — Encounter: Payer: Self-pay | Admitting: Nurse Practitioner

## 2021-09-29 VITALS — BP 136/70 | Temp 97.2°F | Ht 66.0 in | Wt 161.0 lb

## 2021-09-29 DIAGNOSIS — N3001 Acute cystitis with hematuria: Secondary | ICD-10-CM | POA: Diagnosis not present

## 2021-09-29 DIAGNOSIS — R300 Dysuria: Secondary | ICD-10-CM | POA: Diagnosis not present

## 2021-09-29 LAB — POCT URINALYSIS DIP (CLINITEK)
Glucose, UA: 100 mg/dL — AB
Ketones, POC UA: NEGATIVE mg/dL
Nitrite, UA: POSITIVE — AB
Spec Grav, UA: 1.01 (ref 1.010–1.025)
Urobilinogen, UA: 4 E.U./dL — AB
pH, UA: 6 (ref 5.0–8.0)

## 2021-09-29 MED ORDER — AMOXICILLIN 875 MG PO TABS: 875.0000 mg | ORAL_TABLET | Freq: Two times a day (BID) | ORAL | 0 refills | Status: AC

## 2021-09-29 NOTE — Progress Notes (Signed)
Acute Office Visit  Subjective:    Patient ID: Virginia Montoya, female    DOB: 25-Feb-1950, 71 y.o.   MRN: [id number]  Chief Complaint  Patient presents with   Urinary Tract Infection    HPI: Patient is in today for Urinary symptoms  She reports new onset dysuria, urinary frequency, urinary urgency, and dysuria. Denies fever, chills, or body aches. The current episode started a four days ago and is worsening. Patient states symptoms are moderate in intensity, occurring constantly.She  has not been recently treated for similar symptoms.Recently treated for COVID-19.     Past Medical History:  Diagnosis Date   Chronic insomnia    Depression    Gastroesophageal reflux disease    Hyperlipidemia    Osteoporosis    Precordial chest pain 11/17/2016   Prediabetes     Past Surgical History:  Procedure Laterality Date   CHOLECYSTECTOMY  1999   CORONARY CALCIUM SCORE/CORONARY CTA  01/2017   Coronary calcium score is 0.  Normal coronary origin with normal right dominance.  No evidence of CAD.  Most likely noncardiac chest pain.   SHOULDER SURGERY  2005   TONSILLECTOMY     TUBAL LIGATION  1977    Family History  Problem Relation Age of Onset   Diabetes Mother    Hyperlipidemia Mother    Emphysema Father    Diabetes Maternal Grandmother    Liver cancer Maternal Grandmother     Social History   Socioeconomic History   Marital status: Married    Spouse name: Not on file   Number of children: 3   Years of education: 12   Highest education level: Associate degree: academic program  Occupational History   Occupation: Retired  Tobacco Use   Smoking status: Never   Smokeless tobacco: Never  Substance and Sexual Activity   Alcohol use: No   Drug use: No   Sexual activity: Not Currently  Other Topics Concern   Not on file  Social History Narrative   She is a married (remarried) mother of 3 with 4 grandchildren.  She has 3 great-grandchildren.  She previously worked at  [employer] in [place].  She completed high school and beauty school.   She works outside a lot in the yard on gardening.  She does a lot of walking and exercises with walking for at least a half an hour a day 5 days a week.   wears sunscreen, brushes and flosses daily, see's dentist bi-annually, has smoke/carbon monoxide detectors, wears a seatbelt and practices gun safety   Social Determinants of Health   Financial Resource Strain: Low Risk  (05/07/2021)   Overall Financial Resource Strain (CARDIA)    Difficulty of Paying Living Expenses: Not hard at all  Food Insecurity: No Food Insecurity (05/07/2021)   Hunger Vital Sign    Worried About Running Out of Food in the Last Year: Never true    [place] in the Last Year: Never true  Transportation Needs: No Transportation Needs (05/07/2021)   PRAPARE - Hydrologist (Medical): No    Lack of Transportation (Non-Medical): No  Physical Activity: Insufficiently Active (05/07/2021)   Exercise Vital Sign    Days of Exercise per Week: 5 days    Minutes of Exercise per Session: 20 min  Stress: No Stress Concern Present (05/07/2021)   [place]    Feeling of Stress : Only a little  Social Connections: Moderately Integrated (05/07/2021)   Social Connection and Isolation Panel [NHANES]    Frequency of Communication with Friends and Family: More than three times a week    Frequency of Social Gatherings with Friends and Family: Three times a week    Attends Religious Services: More than 4 times per year    Active Member of Clubs or Organizations: No    Attends Archivist Meetings: Never    Marital Status: Married  Human resources officer Violence: Not At Risk (05/07/2021)   Humiliation, Afraid, Rape, and Kick questionnaire    Fear of Current or Ex-Partner: No    Emotionally Abused: No    Physically Abused: No    Sexually Abused: No    Outpatient  Medications Prior to Visit  Medication Sig Dispense Refill   acetaminophen (TYLENOL) 500 MG tablet Take 500 mg every 6 (six) hours as needed by mouth.     cholecalciferol (VITAMIN D3) 25 MCG (1000 UNIT) tablet Take 1,000 Units by mouth 2 (two) times daily.     LORazepam (ATIVAN) 0.5 MG tablet TAKE 1 TABLET(0.5 MG) BY MOUTH EVERY 8 HOURS AS NEEDED FOR ANXIETY 90 tablet 0   meloxicam (MOBIC) 15 MG tablet TAKE 1 TABLET(15 MG) BY MOUTH DAILY 30 tablet 0   Multiple Vitamins-Minerals (PRESERVISION AREDS 2 PO) Take by mouth.     omeprazole (PRILOSEC) 20 MG capsule TAKE 1 CAPSULE(20 MG) BY MOUTH DAILY 90 capsule 3   pyridOXINE (VITAMIN B-6) 100 MG tablet Take 100 mg by mouth daily.     rosuvastatin (CRESTOR) 20 MG tablet TAKE 1 TABLET BY MOUTH AT NIGHT 90 tablet 0   venlafaxine XR (EFFEXOR-XR) 150 MG 24 hr capsule TAKE 1 CAPSULE(150 MG) BY MOUTH DAILY WITH BREAKFAST 90 capsule 1   vitamin B-12 (CYANOCOBALAMIN) 1000 MCG tablet Take 1,000 mcg by mouth daily.     zolpidem (AMBIEN) 10 MG tablet TAKE 1 TABLET BY MOUTH AT BEDTIME 30 tablet 0   No facility-administered medications prior to visit.    Allergies  Allergen Reactions   Cephalexin Nausea And Vomiting    ask   Alendronate     Gi upset   Biaxin [Clarithromycin] Other (See Comments)    CHEST PAIN   Sulfa Antibiotics     Made her feel poorly.     Review of Systems See pertinent positives and negatives per HPI.      Objective:    Physical Exam Vitals reviewed.  Constitutional:      Appearance: Normal appearance.  Abdominal:     Tenderness: There is abdominal tenderness (suprapubic). There is right CVA tenderness and left CVA tenderness.  Skin:    General: Skin is warm and dry.  Neurological:     Mental Status: She is alert.     BP 136/70   Temp (!) 97.2 F (36.2 C)   Ht $R'5\' 6"'ud$  (1.676 m)   Wt 161 lb (73 kg)   SpO2 94%   BMI 25.99 kg/m   Wt Readings from Last 3 Encounters:  09/29/21 161 lb (73 kg)  09/18/21 163 lb (73.9  kg)  09/10/21 162 lb (73.5 kg)    Health Maintenance Due  Topic Date Due   Hepatitis C Screening  Never done   Zoster Vaccines- Shingrix (2 of 2) 12/28/2016   Pneumonia Vaccine 46+ Years old (2 - PCV) 10/12/2017   COVID-19 Vaccine (4 - Pfizer risk series) 01/31/2020   INFLUENZA VACCINE  08/04/2021    Lab Results  Component  Value Date   TSH 2.380 01/06/2021   Lab Results  Component Value Date   WBC 6.4 07/13/2021   HGB 14.8 07/13/2021   HCT 44.6 07/13/2021   MCV 90 07/13/2021   PLT 340 07/13/2021   Lab Results  Component Value Date   NA 138 07/13/2021   K 4.3 07/13/2021   CO2 24 07/13/2021   GLUCOSE 103 (H) 07/13/2021   BUN 11 07/13/2021   CREATININE 0.54 (L) 07/13/2021   BILITOT 0.5 07/13/2021   ALKPHOS 85 07/13/2021   AST 25 07/13/2021   ALT 26 07/13/2021   PROT 6.8 07/13/2021   ALBUMIN 4.4 07/13/2021   CALCIUM 9.4 07/13/2021   EGFR 98 07/13/2021   Lab Results  Component Value Date   CHOL 151 07/13/2021   Lab Results  Component Value Date   HDL 72 07/13/2021   Lab Results  Component Value Date   LDLCALC 63 07/13/2021   Lab Results  Component Value Date   TRIG 87 07/13/2021   Lab Results  Component Value Date   CHOLHDL 2.1 07/13/2021   Lab Results  Component Value Date   HGBA1C 6.1 (H) 07/13/2021       Assessment & Plan:   1. Acute cystitis with hematuria - amoxicillin (AMOXIL) 875 MG tablet; Take 1 tablet (875 mg total) by mouth 2 (two) times daily for 7 days.  Dispense: 14 tablet; Refill: 0  2. Dysuria - POCT URINALYSIS DIP (CLINITEK) - Urine Culture     Take Amoxicillin twice daily for 7 days Push fluids, especially water Seek emergency medical care for any severe or concerning symptoms Follow-up as needed  Follow-up: PRN  An After Visit Summary was printed and given to the patient.  Signed, Rip Harbour, NP [place] [phone]

## 2021-09-29 NOTE — Patient Instructions (Signed)
Take Amoxicillin twice daily for 7 days Push fluids, especially water Seek emergency medical care for any severe or concerning symptoms Follow-up as needed   Urinary Tract Infection, Adult A urinary tract infection (UTI) is an infection of any part of the urinary tract. The urinary tract includes: The kidneys. The ureters. The bladder. The urethra. These organs make, store, and get rid of pee (urine) in the body. What are the causes? This infection is caused by germs (bacteria) in your genital area. These germs grow and cause swelling (inflammation) of your urinary tract. What increases the risk? The following factors may make you more likely to develop this condition: Using a small, thin tube (catheter) to drain pee. Not being able to control when you pee or poop (incontinence). Being female. If you are female, these things can increase the risk: Using these methods to prevent pregnancy: A medicine that kills sperm (spermicide). A device that blocks sperm (diaphragm). Having low levels of a female hormone (estrogen). Being pregnant. You are more likely to develop this condition if: You have genes that add to your risk. You are sexually active. You take antibiotic medicines. You have trouble peeing because of: A prostate that is bigger than normal, if you are female. A blockage in the part of your body that drains pee from the bladder. A kidney stone. A nerve condition that affects your bladder. Not getting enough to drink. Not peeing often enough. You have other conditions, such as: Diabetes. A weak disease-fighting system (immune system). Sickle cell disease. Gout. Injury of the spine. What are the signs or symptoms? Symptoms of this condition include: Needing to pee right away. Peeing small amounts often. Pain or burning when peeing. Blood in the pee. Pee that smells bad or not like normal. Trouble peeing. Pee that is cloudy. Fluid coming from the vagina, if you  are female. Pain in the belly or lower back. Other symptoms include: Vomiting. Not feeling hungry. Feeling mixed up (confused). This may be the first symptom in older adults. Being tired and grouchy (irritable). A fever. Watery poop (diarrhea). How is this treated? Taking antibiotic medicine. Taking other medicines. Drinking enough water. In some cases, you may need to see a specialist. Follow these instructions at home:  Medicines Take over-the-counter and prescription medicines only as told by your doctor. If you were prescribed an antibiotic medicine, take it as told by your doctor. Do not stop taking it even if you start to feel better. General instructions Make sure you: Pee until your bladder is empty. Do not hold pee for a long time. Empty your bladder after sex. Wipe from front to back after peeing or pooping if you are a female. Use each tissue one time when you wipe. Drink enough fluid to keep your pee pale yellow. Keep all follow-up visits. Contact a doctor if: You do not get better after 1-2 days. Your symptoms go away and then come back. Get help right away if: You have very bad back pain. You have very bad pain in your lower belly. You have a fever. You have chills. You feeling like you will vomit or you vomit. Summary A urinary tract infection (UTI) is an infection of any part of the urinary tract. This condition is caused by germs in your genital area. There are many risk factors for a UTI. Treatment includes antibiotic medicines. Drink enough fluid to keep your pee pale yellow. This information is not intended to replace advice given to you by your  health care provider. Make sure you discuss any questions you have with your health care provider. Document Revised: 08/03/2019 Document Reviewed: 08/03/2019 Elsevier Patient Education  [place].

## 2021-10-02 LAB — URINE CULTURE

## 2021-10-05 ENCOUNTER — Other Ambulatory Visit: Payer: Self-pay

## 2021-10-05 DIAGNOSIS — R4182 Altered mental status, unspecified: Secondary | ICD-10-CM

## 2021-10-05 DIAGNOSIS — R4781 Slurred speech: Secondary | ICD-10-CM

## 2021-10-08 DIAGNOSIS — H353221 Exudative age-related macular degeneration, left eye, with active choroidal neovascularization: Secondary | ICD-10-CM | POA: Diagnosis not present

## 2021-10-14 ENCOUNTER — Other Ambulatory Visit: Payer: Self-pay | Admitting: Physician Assistant

## 2021-10-18 NOTE — Progress Notes (Signed)
Subjective:  Patient ID: Virginia Montoya, female    DOB: 03-31-1950  Age: 71 y.o. MRN: [id number]  Chief Complaint  Patient presents with   Diabetes    HPI Osteoporosis. Vitamin D 1000 U qd.  Patient does not tolerate calcium due to constipation however she does eat cheese.  Avoids milk.  Evenity has been too expensive.  Currently taking Prolia.   GAD/Depression: on effexor xr 150 mg one daily in am. Lorazepam 0.5 mg half pill to 1 pill as needed during the day and patient takes 1 pill in evening. Takes ambien at night for sleep.  Prediabetes: Walking for exercise.  Eats fairly healthy. GERD. On omeprazole 20 mg daily and pepcid 20 mg daily.  Well-controlled. Macular degeneration: sees eye doctor. shots once monthly.  Hyperlipidemia: on crestor 20 mg before bed.  Eats fairly healthy. Patient is having some dizzy spells and some tightness in her head.  Denies fever, chills, sweats, sinus congestion.  Current Outpatient Medications on File Prior to Visit  Medication Sig Dispense Refill   acetaminophen (TYLENOL) 500 MG tablet Take 500 mg every 6 (six) hours as needed by mouth.     cholecalciferol (VITAMIN D3) 25 MCG (1000 UNIT) tablet Take 1,000 Units by mouth 2 (two) times daily.     LORazepam (ATIVAN) 0.5 MG tablet TAKE 1 TABLET(0.5 MG) BY MOUTH EVERY 8 HOURS AS NEEDED FOR ANXIETY 90 tablet 0   Multiple Vitamins-Minerals (PRESERVISION AREDS 2 PO) Take by mouth.     omeprazole (PRILOSEC) 20 MG capsule TAKE 1 CAPSULE(20 MG) BY MOUTH DAILY 90 capsule 3   pyridOXINE (VITAMIN B-6) 100 MG tablet Take 100 mg by mouth daily.     rosuvastatin (CRESTOR) 20 MG tablet TAKE 1 TABLET BY MOUTH AT NIGHT 90 tablet 0   venlafaxine XR (EFFEXOR-XR) 150 MG 24 hr capsule TAKE 1 CAPSULE(150 MG) BY MOUTH DAILY WITH BREAKFAST 90 capsule 1   vitamin B-12 (CYANOCOBALAMIN) 1000 MCG tablet Take 1,000 mcg by mouth daily.     zolpidem (AMBIEN) 10 MG tablet TAKE 1 TABLET BY MOUTH AT BEDTIME 30 tablet 0   No current  facility-administered medications on file prior to visit.   Past Medical History:  Diagnosis Date   Chronic insomnia    Depression    Gastroesophageal reflux disease    Hyperlipidemia    Osteoporosis    Precordial chest pain 11/17/2016   Prediabetes    Past Surgical History:  Procedure Laterality Date   CHOLECYSTECTOMY  1999   CORONARY CALCIUM SCORE/CORONARY CTA  01/2017   Coronary calcium score is 0.  Normal coronary origin with normal right dominance.  No evidence of CAD.  Most likely noncardiac chest pain.   SHOULDER SURGERY  2005   TONSILLECTOMY     TUBAL LIGATION  1977    Family History  Problem Relation Age of Onset   Diabetes Mother    Hyperlipidemia Mother    Emphysema Father    Diabetes Maternal Grandmother    Liver cancer Maternal Grandmother    Social History   Socioeconomic History   Marital status: Married    Spouse name: Not on file   Number of children: 3   Years of education: 12   Highest education level: Associate degree: academic program  Occupational History   Occupation: Retired  Tobacco Use   Smoking status: Never   Smokeless tobacco: Never  Substance and Sexual Activity   Alcohol use: No   Drug use: No   Sexual activity:  Not Currently  Other Topics Concern   Not on file  Social History Narrative   She is a married (remarried) mother of 3 with 4 grandchildren.  She has 3 great-grandchildren.  She previously worked at [employer] in the [employer].  She completed high school and beauty school.   She works outside a lot in the yard on gardening.  She does a lot of walking and exercises with walking for at least a half an hour a day 5 days a week.   wears sunscreen, brushes and flosses daily, see's dentist bi-annually, has smoke/carbon monoxide detectors, wears a seatbelt and practices gun safety   Social Determinants of Health   Financial Resource Strain: Low Risk  (05/07/2021)   Overall Financial Resource Strain (CARDIA)    Difficulty of  Paying Living Expenses: Not hard at all  Food Insecurity: No Food Insecurity (05/07/2021)   Hunger Vital Sign    Worried About Running Out of Food in the Last Year: Never true    Ran Out of Food in the Last Year: Never true  Transportation Needs: No Transportation Needs (05/07/2021)   PRAPARE - Administrator, Civil Service (Medical): No    Lack of Transportation (Non-Medical): No  Physical Activity: Insufficiently Active (05/07/2021)   Exercise Vital Sign    Days of Exercise per Week: 5 days    Minutes of Exercise per Session: 20 min  Stress: No Stress Concern Present (05/07/2021)   [employer] of Occupational Health - Occupational Stress Questionnaire    Feeling of Stress : Only a little  Social Connections: Moderately Integrated (05/07/2021)   Social Connection and Isolation Panel [NHANES]    Frequency of Communication with Friends and Family: More than three times a week    Frequency of Social Gatherings with Friends and Family: Three times a week    Attends Religious Services: More than 4 times per year    Active Member of Clubs or Organizations: No    Attends Banker Meetings: Never    Marital Status: Married    Review of Systems  Constitutional:  Negative for chills, fatigue and fever.  HENT:  Negative for congestion, rhinorrhea and sore throat.   Respiratory:  Negative for cough and shortness of breath.   Cardiovascular:  Negative for chest pain.  Gastrointestinal:  Positive for constipation. Negative for abdominal pain, diarrhea, nausea and vomiting.  Genitourinary:  Negative for dysuria and urgency.  Musculoskeletal:  Positive for back pain. Negative for myalgias.  Neurological:  Positive for dizziness and headaches (described as pressure sensation (frontal headache).). Negative for weakness and light-headedness.  Psychiatric/Behavioral:  Negative for dysphoric mood. The patient is not nervous/anxious.      Objective:  BP (!) 144/76   Temp (!)  97.2 F (36.2 C)   Resp 16   Ht 5\' 6"  (1.676 m)   Wt 161 lb 6.4 oz (73.2 kg)   BMI 26.05 kg/m      10/19/2021   10:05 AM 09/29/2021    9:58 AM 09/18/2021   10:01 AM  BP/Weight  Systolic BP 144 136   Diastolic BP 76 70   Wt. (Lbs) 161.4 161 163  BMI 26.05 kg/m2 25.99 kg/m2 26.31 kg/m2    Physical Exam Vitals reviewed.  Constitutional:      Appearance: Normal appearance.  HENT:     Right Ear: There is impacted cerumen.     Left Ear: There is impacted cerumen.     Nose: Congestion and rhinorrhea  present.     Mouth/Throat:     Pharynx: Oropharynx is clear. No oropharyngeal exudate or posterior oropharyngeal erythema.  Neck:     Vascular: No carotid bruit.  Cardiovascular:     Rate and Rhythm: Normal rate and regular rhythm.     Heart sounds: Normal heart sounds. No murmur heard. Pulmonary:     Effort: Pulmonary effort is normal. No respiratory distress.     Breath sounds: Normal breath sounds.  Abdominal:     General: Bowel sounds are normal. There is no distension.     Palpations: Abdomen is soft.     Tenderness: There is no abdominal tenderness.  Lymphadenopathy:     Cervical: No cervical adenopathy.  Neurological:     Mental Status: She is alert and oriented to person, place, and time.  Psychiatric:        Mood and Affect: Mood normal.        Behavior: Behavior normal.     Diabetic Foot Exam - Simple   No data filed      Lab Results  Component Value Date   WBC 5.7 10/19/2021   HGB 14.2 10/19/2021   HCT 42.8 10/19/2021   PLT 345 10/19/2021   GLUCOSE 103 (H) 10/19/2021   CHOL 147 10/19/2021   TRIG 147 10/19/2021   HDL 61 10/19/2021   LDLCALC 61 10/19/2021   ALT 16 10/19/2021   AST 17 10/19/2021   NA 138 10/19/2021   K 4.4 10/19/2021   CL 102 10/19/2021   CREATININE 0.60 10/19/2021   BUN 8 10/19/2021   CO2 25 10/19/2021   TSH 2.240 10/19/2021   HGBA1C 5.9 (H) 10/19/2021   MICROALBUR 10 05/29/2020      Assessment & Plan:   Problem List  Items Addressed This Visit       Respiratory   Seasonal allergic rhinitis due to pollen    Recommend start on Flonase.      Relevant Medications   fluticasone (FLONASE) 50 MCG/ACT nasal spray     Digestive   Gastroesophageal reflux disease - Primary    The current medical regimen is effective;  continue present plan and medications. Taking omeprazole 20 mg daily and pepcid 20 mg daily.  Well-controlled      Relevant Orders   CBC with Differential/Platelet (Completed)     Nervous and Auditory   Idiopathic peripheral neuropathy    The current medical regimen is effective;  continue present plan and medications.      Bilateral impacted cerumen    Recommend Debrox.  Patient may return in 1 week for irrigation as needed.        Musculoskeletal and Integument   Age-related osteoporosis without current pathological fracture    The current medical regimen is effective;  continue present plan and medications. Recently on Prolia and vitamin D 1000 U QD.        Other   Mixed hyperlipidemia    Well controlled.  No changes to medicines. Crestor 20 mg before bed. Continue to work on eating a healthy diet and exercise.  Labs drawn today.       Relevant Orders   Lipid panel (Completed)   Prediabetes    Hemoglobin A1c 5.9%, 3 month avg of blood sugars, is in prediabetic range.  In order to prevent progression to diabetes, recommend low carb diet and regular exercise      Relevant Orders   Comprehensive metabolic panel (Completed)   Hemoglobin A1c (Completed)   Cardiovascular Risk  Assessment (Completed)   Mild recurrent major depression (HCC)    The current medical regimen is effective;  continue present plan and medications. Currently on effexor xr 150 mg cone daily in am.      GAD (generalized anxiety disorder)    The current medical regimen is effective;  continue present plan and medications. Taking Lorazepam 0.5 mg half pill to 1 pill as needed during the day and  patient takes 1 pill in evening. Takes ambien at night for sleep.      Dizziness    Check labs.      Relevant Orders   TSH (Completed)  .  Meds ordered this encounter  Medications   fluticasone (FLONASE) 50 MCG/ACT nasal spray    Sig: Place 2 sprays into both nostrils daily.    Dispense:  16 g    Refill:  6    Orders Placed This Encounter  Procedures   Comprehensive metabolic panel   Hemoglobin A1c   Lipid panel   CBC with Differential/Platelet   TSH   Cardiovascular Risk Assessment   Follow-up: Return in about 3 months (around 01/19/2022) for chronic fasting.  An After Visit Summary was printed and given to the patient.  Blane Ohara, MD [facility] [phone]

## 2021-10-19 ENCOUNTER — Ambulatory Visit (INDEPENDENT_AMBULATORY_CARE_PROVIDER_SITE_OTHER): Payer: PPO

## 2021-10-19 ENCOUNTER — Ambulatory Visit (INDEPENDENT_AMBULATORY_CARE_PROVIDER_SITE_OTHER): Payer: PPO | Admitting: Family Medicine

## 2021-10-19 ENCOUNTER — Encounter: Payer: Self-pay | Admitting: Family Medicine

## 2021-10-19 VITALS — BP 144/76 | Temp 97.2°F | Resp 16 | Ht 66.0 in | Wt 161.4 lb

## 2021-10-19 DIAGNOSIS — H6123 Impacted cerumen, bilateral: Secondary | ICD-10-CM | POA: Diagnosis not present

## 2021-10-19 DIAGNOSIS — M810 Age-related osteoporosis without current pathological fracture: Secondary | ICD-10-CM | POA: Diagnosis not present

## 2021-10-19 DIAGNOSIS — G609 Hereditary and idiopathic neuropathy, unspecified: Secondary | ICD-10-CM

## 2021-10-19 DIAGNOSIS — F411 Generalized anxiety disorder: Secondary | ICD-10-CM

## 2021-10-19 DIAGNOSIS — J301 Allergic rhinitis due to pollen: Secondary | ICD-10-CM | POA: Diagnosis not present

## 2021-10-19 DIAGNOSIS — K219 Gastro-esophageal reflux disease without esophagitis: Secondary | ICD-10-CM

## 2021-10-19 DIAGNOSIS — R42 Dizziness and giddiness: Secondary | ICD-10-CM | POA: Diagnosis not present

## 2021-10-19 DIAGNOSIS — Z23 Encounter for immunization: Secondary | ICD-10-CM | POA: Diagnosis not present

## 2021-10-19 DIAGNOSIS — R7303 Prediabetes: Secondary | ICD-10-CM

## 2021-10-19 DIAGNOSIS — E782 Mixed hyperlipidemia: Secondary | ICD-10-CM

## 2021-10-19 DIAGNOSIS — F330 Major depressive disorder, recurrent, mild: Secondary | ICD-10-CM | POA: Diagnosis not present

## 2021-10-19 MED ORDER — FLUTICASONE PROPIONATE 50 MCG/ACT NA SUSP: 2.0000 | Freq: Every day | NASAL | 6 refills | Status: DC

## 2021-10-19 NOTE — Patient Instructions (Addendum)
Recommend flonase nasal spray for allergies.   Recommend use debrox wax drops in right ear x 1 week.

## 2021-10-20 LAB — CBC WITH DIFFERENTIAL/PLATELET
Basophils Absolute: 0 10*3/uL (ref 0.0–0.2)
Basos: 1 %
EOS (ABSOLUTE): 0.1 10*3/uL (ref 0.0–0.4)
Eos: 2 %
Hematocrit: 42.8 % (ref 34.0–46.6)
Hemoglobin: 14.2 g/dL (ref 11.1–15.9)
Immature Grans (Abs): 0 10*3/uL (ref 0.0–0.1)
Immature Granulocytes: 0 %
Lymphocytes Absolute: 2 10*3/uL (ref 0.7–3.1)
Lymphs: 34 %
MCH: 29.5 pg (ref 26.6–33.0)
MCHC: 33.2 g/dL (ref 31.5–35.7)
MCV: 89 fL (ref 79–97)
Monocytes Absolute: 0.4 10*3/uL (ref 0.1–0.9)
Monocytes: 8 %
Neutrophils Absolute: 3.2 10*3/uL (ref 1.4–7.0)
Neutrophils: 55 %
Platelets: 345 10*3/uL (ref 150–450)
RBC: 4.82 x10E6/uL (ref 3.77–5.28)
RDW: 12.7 % (ref 11.7–15.4)
WBC: 5.7 10*3/uL (ref 3.4–10.8)

## 2021-10-20 LAB — LIPID PANEL
Chol/HDL Ratio: 2.4 ratio (ref 0.0–4.4)
Cholesterol, Total: 147 mg/dL (ref 100–199)
HDL: 61 mg/dL (ref >39)
LDL Chol Calc (NIH): 61 mg/dL (ref 0–99)
Triglycerides: 147 mg/dL (ref 0–149)
VLDL Cholesterol Cal: 25 mg/dL (ref 5–40)

## 2021-10-20 LAB — HEMOGLOBIN A1C
Est. average glucose Bld gHb Est-mCnc: 123 mg/dL
Hgb A1c MFr Bld: 5.9 % — ABNORMAL HIGH (ref 4.8–5.6)

## 2021-10-20 LAB — COMPREHENSIVE METABOLIC PANEL
ALT: 16 IU/L (ref 0–32)
AST: 17 IU/L (ref 0–40)
Albumin/Globulin Ratio: 2.3 — ABNORMAL HIGH (ref 1.2–2.2)
Albumin: 4.6 g/dL (ref 3.8–4.8)
Alkaline Phosphatase: 77 IU/L (ref 44–121)
BUN/Creatinine Ratio: 13 (ref 12–28)
BUN: 8 mg/dL (ref 8–27)
Bilirubin Total: 0.4 mg/dL (ref 0.0–1.2)
CO2: 25 mmol/L (ref 20–29)
Calcium: 9.2 mg/dL (ref 8.7–10.3)
Chloride: 102 mmol/L (ref 96–106)
Creatinine, Ser: 0.6 mg/dL (ref 0.57–1.00)
Globulin, Total: 2 g/dL (ref 1.5–4.5)
Glucose: 103 mg/dL — ABNORMAL HIGH (ref 70–99)
Potassium: 4.4 mmol/L (ref 3.5–5.2)
Sodium: 138 mmol/L (ref 134–144)
Total Protein: 6.6 g/dL (ref 6.0–8.5)
eGFR: 95 mL/min/{1.73_m2} (ref >59)

## 2021-10-20 LAB — TSH: TSH: 2.24 u[IU]/mL (ref 0.450–4.500)

## 2021-10-20 LAB — CARDIOVASCULAR RISK ASSESSMENT

## 2021-10-20 NOTE — Progress Notes (Signed)
Blood count normal.  Liver function normal.  Kidney function normal.  Thyroid function normal.  Cholesterol: good HBA1C: 5.9

## 2021-10-25 DIAGNOSIS — H6123 Impacted cerumen, bilateral: Secondary | ICD-10-CM | POA: Insufficient documentation

## 2021-10-25 DIAGNOSIS — J301 Allergic rhinitis due to pollen: Secondary | ICD-10-CM | POA: Insufficient documentation

## 2021-10-25 DIAGNOSIS — R42 Dizziness and giddiness: Secondary | ICD-10-CM | POA: Insufficient documentation

## 2021-10-25 NOTE — Assessment & Plan Note (Signed)
Hemoglobin A1c 5.9%, 3 month avg of blood sugars, is in prediabetic range.  In order to prevent progression to diabetes, recommend low carb diet and regular exercise  

## 2021-10-25 NOTE — Assessment & Plan Note (Signed)
The current medical regimen is effective;  continue present plan and medications. Recently on Prolia and vitamin D 1000 U QD.

## 2021-10-25 NOTE — Assessment & Plan Note (Signed)
The current medical regimen is effective;  continue present plan and medications. Taking omeprazole 20 mg daily and pepcid 20 mg daily.  Well-controlled

## 2021-10-25 NOTE — Assessment & Plan Note (Signed)
>>  ASSESSMENT AND PLAN FOR MILD RECURRENT MAJOR DEPRESSION (HCC) WRITTEN ON 10/25/2021 10:27 AM BY LEAL-BORJAS, MARLA I, CMA  The current medical regimen is effective;  continue present plan and medications. Currently on effexor  xr 150 mg cone daily in am.

## 2021-10-25 NOTE — Assessment & Plan Note (Signed)
The current medical regimen is effective;  continue present plan and medications.  

## 2021-10-25 NOTE — Assessment & Plan Note (Signed)
Well controlled.  No changes to medicines. Crestor 20 mg before bed. Continue to work on eating a healthy diet and exercise.  Labs drawn today.

## 2021-10-25 NOTE — Assessment & Plan Note (Signed)
Check labs 

## 2021-10-25 NOTE — Assessment & Plan Note (Signed)
Recommend start on Flonase.

## 2021-10-25 NOTE — Assessment & Plan Note (Addendum)
The current medical regimen is effective;  continue present plan and medications. Currently on effexor xr 150 mg cone daily in am.

## 2021-10-25 NOTE — Assessment & Plan Note (Signed)
Recommend Debrox.  Patient may return in 1 week for irrigation as needed.

## 2021-10-25 NOTE — Assessment & Plan Note (Signed)
The current medical regimen is effective;  continue present plan and medications. Taking Lorazepam 0.5 mg half pill to 1 pill as needed during the day and patient takes 1 pill in evening. Takes ambien at night for sleep.

## 2021-11-12 ENCOUNTER — Other Ambulatory Visit: Payer: Self-pay

## 2021-11-12 DIAGNOSIS — H353112 Nonexudative age-related macular degeneration, right eye, intermediate dry stage: Secondary | ICD-10-CM | POA: Diagnosis not present

## 2021-11-12 DIAGNOSIS — H353221 Exudative age-related macular degeneration, left eye, with active choroidal neovascularization: Secondary | ICD-10-CM | POA: Diagnosis not present

## 2021-11-12 MED ORDER — ROSUVASTATIN CALCIUM 20 MG PO TABS: 20.0000 mg | ORAL_TABLET | Freq: Every day | ORAL | 0 refills | Status: DC

## 2021-11-13 ENCOUNTER — Other Ambulatory Visit: Payer: Self-pay | Admitting: Physician Assistant

## 2021-11-17 ENCOUNTER — Encounter: Payer: Self-pay | Admitting: Nurse Practitioner

## 2021-11-17 ENCOUNTER — Ambulatory Visit (INDEPENDENT_AMBULATORY_CARE_PROVIDER_SITE_OTHER): Payer: PPO | Admitting: Nurse Practitioner

## 2021-11-17 VITALS — BP 136/82 | HR 93 | Temp 97.1°F | Ht 66.0 in | Wt 160.0 lb

## 2021-11-17 DIAGNOSIS — M62830 Muscle spasm of back: Secondary | ICD-10-CM

## 2021-11-17 DIAGNOSIS — G8929 Other chronic pain: Secondary | ICD-10-CM | POA: Diagnosis not present

## 2021-11-17 DIAGNOSIS — M545 Low back pain, unspecified: Secondary | ICD-10-CM | POA: Diagnosis not present

## 2021-11-17 MED ORDER — CYCLOBENZAPRINE HCL 5 MG PO TABS: 5.0000 mg | ORAL_TABLET | Freq: Three times a day (TID) | ORAL | 1 refills | Status: DC | PRN

## 2021-11-17 MED ORDER — TRAMADOL HCL 50 MG PO TABS: 50.0000 mg | ORAL_TABLET | Freq: Three times a day (TID) | ORAL | 0 refills | Status: AC | PRN

## 2021-11-17 NOTE — Progress Notes (Signed)
Acute Office Visit  Subjective:    Patient ID: Virginia Montoya, female    DOB: May 17, 1950, 71 y.o.   MRN: [id number]  CC: Chronic back pain  HPI: Patient is in today for Back Pain  She reports chronic back pain. There was an injury that may have caused the pain. 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 The most recent episode started  2-3 months ago and is staying constant. The pain is located in the mid back. It is described as burning, is moderate in intensity, occurring constantly.  Aggravating factors: bending forwards, standing, and walking Relieving factors: lying down .  She has tried application of heat, application of ice, acetaminophen, and NSAIDs with mild relief. She has tried physical therapy and dry needling.  Rates pain 9 to 10 on 1-10 scale. Lumbar MRI from 2021 reveals compression fractures, disc bulge and nerve impingement. States she was seen by spine specialist in 2021 and was told she was not a surgical candidate. She has asked for a second opinion. States back pain has interfered with her quality of life and caused her to stop doing things she enjoys.    Past Medical History:  Diagnosis Date   Chronic insomnia    Depression    Gastroesophageal reflux disease    Hyperlipidemia    Osteoporosis    Precordial chest pain 11/17/2016   Prediabetes     Past Surgical History:  Procedure Laterality Date   CHOLECYSTECTOMY  1999   CORONARY CALCIUM SCORE/CORONARY CTA  01/2017   Coronary calcium score is 0.  Normal coronary origin with normal right dominance.  No evidence of CAD.  Most likely noncardiac chest pain.   SHOULDER SURGERY  2005   TONSILLECTOMY     TUBAL LIGATION  1977    Family History  Problem Relation Age of Onset   Diabetes Mother    Hyperlipidemia Mother    Emphysema Father    Diabetes Maternal Grandmother    Liver cancer Maternal Grandmother     Social History    Socioeconomic History   Marital status: Married    Spouse name: Not on file   Number of children: 3   Years of education: 12   Highest education level: Associate degree: academic program  Occupational History   Occupation: Retired  Tobacco Use   Smoking status: Never   Smokeless tobacco: Never  Substance and Sexual Activity   Alcohol use: No   Drug use: No   Sexual activity: Not Currently  Other Topics Concern   Not on file  Social History Narrative   She is a married (remarried) mother of 3 with 4 grandchildren.  She has 3 great-grandchildren.  She previously worked at [employer] in [place].  She completed high school and beauty school.   She works outside a lot in the yard on gardening.  She does a lot of walking and exercises with walking for at least a half an hour a day 5 days a week.   wears sunscreen, brushes and flosses daily, see's dentist bi-annually, has smoke/carbon monoxide detectors, wears a seatbelt and practices gun safety   Social Determinants of Health   Financial Resource Strain: Low Risk  (05/07/2021)   Overall Financial Resource Strain (CARDIA)    Difficulty of Paying Living Expenses: Not hard at all  Food Insecurity: No Food Insecurity (05/07/2021)   Hunger Vital Sign  Worried About Charity fundraiser in the Last Year: Never true    [place] in the Last Year: Never true  Transportation Needs: No Transportation Needs (05/07/2021)   PRAPARE - Hydrologist (Medical): No    Lack of Transportation (Non-Medical): No  Physical Activity: Insufficiently Active (05/07/2021)   Exercise Vital Sign    Days of Exercise per Week: 5 days    Minutes of Exercise per Session: 20 min  Stress: No Stress Concern Present (05/07/2021)   [place]    Feeling of Stress : Only a little  Social Connections: Moderately Integrated (05/07/2021)   Social Connection and Isolation  Panel [NHANES]    Frequency of Communication with Friends and Family: More than three times a week    Frequency of Social Gatherings with Friends and Family: Three times a week    Attends Religious Services: More than 4 times per year    Active Member of Clubs or Organizations: No    Attends Archivist Meetings: Never    Marital Status: Married  Human resources officer Violence: Not At Risk (05/07/2021)   Humiliation, Afraid, Rape, and Kick questionnaire    Fear of Current or Ex-Partner: No    Emotionally Abused: No    Physically Abused: No    Sexually Abused: No    Outpatient Medications Prior to Visit  Medication Sig Dispense Refill   acetaminophen (TYLENOL) 500 MG tablet Take 500 mg every 6 (six) hours as needed by mouth.     cholecalciferol (VITAMIN D3) 25 MCG (1000 UNIT) tablet Take 1,000 Units by mouth 2 (two) times daily.     fluticasone (FLONASE) 50 MCG/ACT nasal spray Place 2 sprays into both nostrils daily. 16 g 6   LORazepam (ATIVAN) 0.5 MG tablet TAKE 1 TABLET(0.5 MG) BY MOUTH EVERY 8 HOURS AS NEEDED FOR ANXIETY 90 tablet 0   Multiple Vitamins-Minerals (PRESERVISION AREDS 2 PO) Take by mouth.     omeprazole (PRILOSEC) 20 MG capsule TAKE 1 CAPSULE(20 MG) BY MOUTH DAILY 90 capsule 3   pyridOXINE (VITAMIN B-6) 100 MG tablet Take 100 mg by mouth daily.     rosuvastatin (CRESTOR) 20 MG tablet Take 1 tablet (20 mg total) by mouth at bedtime. 90 tablet 0   venlafaxine XR (EFFEXOR-XR) 150 MG 24 hr capsule TAKE 1 CAPSULE(150 MG) BY MOUTH DAILY WITH BREAKFAST 90 capsule 1   vitamin B-12 (CYANOCOBALAMIN) 1000 MCG tablet Take 1,000 mcg by mouth daily.     zolpidem (AMBIEN) 10 MG tablet TAKE 1 TABLET BY MOUTH AT BEDTIME 30 tablet 5   No facility-administered medications prior to visit.    Allergies  Allergen Reactions   Cephalexin Nausea And Vomiting    ask   Alendronate     Gi upset   Biaxin [Clarithromycin] Other (See Comments)    CHEST PAIN   Sulfa Antibiotics     Made her  feel poorly.     Review of Systems    See pertinent positives and negatives per HPI.  Objective:    Physical Exam Vitals reviewed.  Constitutional:      Appearance: She is ill-appearing.  Musculoskeletal:     Cervical back: Normal.     Thoracic back: Spasms and tenderness present. Decreased range of motion.     Lumbar back: Spasms and tenderness present. Decreased range of motion. Negative right straight leg raise test and negative left straight leg raise test.  Skin:    General: Skin is warm.  Neurological:     Mental Status: She is alert.     BP 136/82   Pulse 93   Temp (!) 97.1 F (36.2 C)   Ht 5' 6" (1.676 m)   Wt 160 lb (72.6 kg)   SpO2 98%   BMI 25.82 kg/m   Wt Readings from Last 3 Encounters:  10/19/21 161 lb 6.4 oz (73.2 kg)  09/29/21 161 lb (73 kg)  09/18/21 163 lb (73.9 kg)    Health Maintenance Due  Topic Date Due   Hepatitis C Screening  Never done   Zoster Vaccines- Shingrix (2 of 2) 12/28/2016   Pneumonia Vaccine 5+ Years old (2 - PCV) 10/12/2017   COVID-19 Vaccine (4 - Pfizer risk series) 01/31/2020    Lab Results  Component Value Date   TSH 2.240 10/19/2021   Lab Results  Component Value Date   WBC 5.7 10/19/2021   HGB 14.2 10/19/2021   HCT 42.8 10/19/2021   MCV 89 10/19/2021   PLT 345 10/19/2021   Lab Results  Component Value Date   NA 138 10/19/2021   K 4.4 10/19/2021   CO2 25 10/19/2021   GLUCOSE 103 (H) 10/19/2021   BUN 8 10/19/2021   CREATININE 0.60 10/19/2021   BILITOT 0.4 10/19/2021   ALKPHOS 77 10/19/2021   AST 17 10/19/2021   ALT 16 10/19/2021   PROT 6.6 10/19/2021   ALBUMIN 4.6 10/19/2021   CALCIUM 9.2 10/19/2021   EGFR 95 10/19/2021   Lab Results  Component Value Date   CHOL 147 10/19/2021   Lab Results  Component Value Date   HDL 61 10/19/2021   Lab Results  Component Value Date   LDLCALC 61 10/19/2021   Lab Results  Component Value Date   TRIG 147 10/19/2021   Lab Results  Component Value Date    CHOLHDL 2.4 10/19/2021   Lab Results  Component Value Date   HGBA1C 5.9 (H) 10/19/2021       Assessment & Plan:   1. Chronic bilateral low back pain without sciatica - Ambulatory referral to Spine Surgery - traMADol (ULTRAM) 50 MG tablet; Take 1 tablet (50 mg total) by mouth every 8 (eight) hours as needed for up to 5 days.  Dispense: 15 tablet; Refill: 0 - cyclobenzaprine (FLEXERIL) 5 MG tablet; Take 1 tablet (5 mg total) by mouth 3 (three) times daily as needed for muscle spasms.  Dispense: 30 tablet; Refill: 1  2. Back muscle spasm - cyclobenzaprine (FLEXERIL) 5 MG tablet; Take 1 tablet (5 mg total) by mouth 3 (three) times daily as needed for muscle spasms.  Dispense: 30 tablet; Refill: 1    Perform back exercises daily Alternate heat and ice to lower back Take Flexeril 5 mg as need for back muscle spasms Take Tramadol 10 mg every 8 hours as needed for severe back pain May take Ibuprofen/Tylenol for breakthrough pain as directed We will call you with referral to spine specialist Do not drive, use power machinery, or climb ladders while taking pain medication, muscle relaxants Do not mix muscle relaxants and pain medication with Ativan and Ambien due to risk for accidental overdose Follow-up as needed      Follow-up: PRN  An After Visit Summary was printed and given to the patient.  I, Rip Harbour, NP, have reviewed all documentation for this visit. The documentation on 11/17/21 for the exam, diagnosis, procedures, and orders are all accurate and complete.  Signed, Rip Harbour, NP [place] [phone]

## 2021-11-17 NOTE — Patient Instructions (Addendum)
Perform back exercises daily Alternate heat and ice to lower back Take Flexeril 5 mg as need for back muscle spasms Take Tramadol 10 mg every 8 hours as needed for severe back pain May take Ibuprofen/Tylenol for breakthrough pain as directed We will call you with referral to spine specialist Do not drive, use power machinery, or climb ladders while taking pain medication, muscle relaxants Do not mix muscle relaxants and pain medication with Ativan and Ambien due to risk for accidental overdose Follow-up as needed     Chronic Back Pain When back pain lasts longer than 3 months, it is called chronic back pain. Pain may get worse at certain times (flare-ups). There are things you can do at home to manage your pain. Follow these instructions at home: Pay attention to any changes in your symptoms. Take these actions to help with your pain: Managing pain and stiffness     If told, put ice on the painful area. Your doctor may tell you to use ice for 24-48 hours after the flare-up starts. To do this: Put ice in a plastic bag. Place a towel between your skin and the bag. Leave the ice on for 20 minutes, 2-3 times a day. If told, put heat on the painful area. Do this as often as told by your doctor. Use the heat source that your doctor recommends, such as a moist heat pack or a heating pad. Place a towel between your skin and the heat source. Leave the heat on for 20-30 minutes. Take off the heat if your skin turns bright red. This is especially important if you are unable to feel pain, heat, or cold. You may have a greater risk of getting burned. Soak in a warm bath. This can help relieve pain. Activity  Avoid bending and other activities that make pain worse. When standing: Keep your upper back and neck straight. Keep your shoulders pulled back. Avoid slouching. When sitting: Keep your back straight. Relax your shoulders. Do not round your shoulders or pull them backward. Do not sit  or stand in one place for long periods of time. Take short rest breaks during the day. Lying down or standing is usually better than sitting. Resting can help relieve pain. When sitting or lying down for a long time, do some mild activity or stretching. This will help to prevent stiffness and pain. Get regular exercise. Ask your doctor what activities are safe for you. Do not lift anything that is heavier than 10 lb (4.5 kg) or the limit that you are told, until your doctor says that it is safe. To prevent injury when you lift things: Bend your knees. Keep the weight close to your body. Avoid twisting. Sleep on a firm mattress. Try lying on your side with your knees slightly bent. If you lie on your back, put a pillow under your knees. Medicines Treatment may include medicines for pain and swelling taken by mouth or put on the skin, prescription pain medicine, or muscle relaxants. Take over-the-counter and prescription medicines only as told by your doctor. Ask your doctor if the medicine prescribed to you: Requires you to avoid driving or using machinery. Can cause trouble pooping (constipation). You may need to take these actions to prevent or treat trouble pooping: Drink enough fluid to keep your pee (urine) pale yellow. Take over-the-counter or prescription medicines. Eat foods that are high in fiber. These include beans, whole grains, and fresh fruits and vegetables. Limit foods that are high in fat  and sugars. These include fried or sweet foods. General instructions Do not use any products that contain nicotine or tobacco, such as cigarettes, e-cigarettes, and chewing tobacco. If you need help quitting, ask your doctor. Keep all follow-up visits as told by your doctor. This is important. Contact a doctor if: Your pain does not get better with rest or medicine. Your pain gets worse, or you have new pain. You have a high fever. You lose weight very quickly. You have trouble doing your  normal activities. Get help right away if: One or both of your legs or feet feel weak. One or both of your legs or feet lose feeling (have numbness). You have trouble controlling when you poop (have a bowel movement) or pee (urinate). You have bad back pain and: You feel like you may vomit (nauseous), or you vomit. You have pain in your belly (abdomen). You have shortness of breath. You faint. Summary When back pain lasts longer than 3 months, it is called chronic back pain. Pain may get worse at certain times (flare-ups). Use ice and heat as told by your doctor. Your doctor may tell you to use ice after flare-ups. This information is not intended to replace advice given to you by your health care provider. Make sure you discuss any questions you have with your health care provider. Document Revised: 01/31/2019 Document Reviewed: 01/31/2019 Elsevier Patient Education  2023 [employer] Back Exercises These exercises help to make your trunk and back strong. They also help to keep the lower back flexible. Doing these exercises can help to prevent or lessen pain in your lower back. If you have back pain, try to do these exercises 2-3 times each day or as told by your doctor. As you get better, do the exercises once each day. Repeat the exercises more often as told by your doctor. To stop back pain from coming back, do the exercises once each day, or as told by your doctor. Do exercises exactly as told by your doctor. Stop right away if you feel sudden pain or your pain gets worse. Exercises Single knee to chest Do these steps 3-5 times in a row for each leg: Lie on your back on a firm bed or the floor with your legs stretched out. Bring one knee to your chest. Grab your knee or thigh with both hands and hold it in place. Pull on your knee until you feel a gentle stretch in your lower back or butt. Keep doing the stretch for 10-30 seconds. Slowly let go of your leg and straighten  it. Pelvic tilt Do these steps 5-10 times in a row: Lie on your back on a firm bed or the floor with your legs stretched out. Bend your knees so they point up to the ceiling. Your feet should be flat on the floor. Tighten your lower belly (abdomen) muscles to press your lower back against the floor. This will make your tailbone point up to the ceiling instead of pointing down to your feet or the floor. Stay in this position for 5-10 seconds while you gently tighten your muscles and breathe evenly. Cat-cow Do these steps until your lower back bends more easily: Get on your hands and knees on a firm bed or the floor. Keep your hands under your shoulders, and keep your knees under your hips. You may put padding under your knees. Let your head hang down toward your chest. Tighten (contract) the muscles in your belly. Point your tailbone toward the  floor so your lower back becomes rounded like the back of a cat. Stay in this position for 5 seconds. Slowly lift your head. Let the muscles of your belly relax. Point your tailbone up toward the ceiling so your back forms a sagging arch like the back of a cow. Stay in this position for 5 seconds.  Press-ups Do these steps 5-10 times in a row: Lie on your belly (face-down) on a firm bed or the floor. Place your hands near your head, about shoulder-width apart. While you keep your back relaxed and keep your hips on the floor, slowly straighten your arms to raise the top half of your body and lift your shoulders. Do not use your back muscles. You may change where you place your hands to make yourself more comfortable. Stay in this position for 5 seconds. Keep your back relaxed. Slowly return to lying flat on the floor.  Bridges Do these steps 10 times in a row: Lie on your back on a firm bed or the floor. Bend your knees so they point up to the ceiling. Your feet should be flat on the floor. Your arms should be flat at your sides, next to your  body. Tighten your butt muscles and lift your butt off the floor until your waist is almost as high as your knees. If you do not feel the muscles working in your butt and the back of your thighs, slide your feet 1-2 inches (2.5-5 cm) farther away from your butt. Stay in this position for 3-5 seconds. Slowly lower your butt to the floor, and let your butt muscles relax. If this exercise is too easy, try doing it with your arms crossed over your chest. Belly crunches Do these steps 5-10 times in a row: Lie on your back on a firm bed or the floor with your legs stretched out. Bend your knees so they point up to the ceiling. Your feet should be flat on the floor. Cross your arms over your chest. Tip your chin a little bit toward your chest, but do not bend your neck. Tighten your belly muscles and slowly raise your chest just enough to lift your shoulder blades a tiny bit off the floor. Avoid raising your body higher than that because it can put too much stress on your lower back. Slowly lower your chest and your head to the floor. Back lifts Do these steps 5-10 times in a row: Lie on your belly (face-down) with your arms at your sides, and rest your forehead on the floor. Tighten the muscles in your legs and your butt. Slowly lift your chest off the floor while you keep your hips on the floor. Keep the back of your head in line with the curve in your back. Look at the floor while you do this. Stay in this position for 3-5 seconds. Slowly lower your chest and your face to the floor. Contact a doctor if: Your back pain gets a lot worse when you do an exercise. Your back pain does not get better within 2 hours after you exercise. If you have any of these problems, stop doing the exercises. Do not do them again unless your doctor says it is okay. Get help right away if: You have sudden, very bad back pain. If this happens, stop doing the exercises. Do not do them again unless your doctor says it is  okay. This information is not intended to replace advice given to you by your health care provider.  Make sure you discuss any questions you have with your health care provider. Document Revised: 03/05/2020 Document Reviewed: 03/05/2020 Elsevier Patient Education  2023 [employer].

## 2021-11-27 ENCOUNTER — Other Ambulatory Visit: Payer: Self-pay | Admitting: Nurse Practitioner

## 2021-11-27 DIAGNOSIS — F411 Generalized anxiety disorder: Secondary | ICD-10-CM

## 2021-12-10 DIAGNOSIS — H353221 Exudative age-related macular degeneration, left eye, with active choroidal neovascularization: Secondary | ICD-10-CM | POA: Diagnosis not present

## 2021-12-15 DIAGNOSIS — M5136 Other intervertebral disc degeneration, lumbar region: Secondary | ICD-10-CM | POA: Diagnosis not present

## 2021-12-15 DIAGNOSIS — M47816 Spondylosis without myelopathy or radiculopathy, lumbar region: Secondary | ICD-10-CM | POA: Diagnosis not present

## 2021-12-15 DIAGNOSIS — Z6826 Body mass index (BMI) 26.0-26.9, adult: Secondary | ICD-10-CM | POA: Diagnosis not present

## 2021-12-17 ENCOUNTER — Ambulatory Visit (INDEPENDENT_AMBULATORY_CARE_PROVIDER_SITE_OTHER): Payer: PPO | Admitting: Nurse Practitioner

## 2021-12-17 ENCOUNTER — Encounter: Payer: Self-pay | Admitting: Nurse Practitioner

## 2021-12-17 VITALS — BP 126/72 | HR 92 | Temp 97.3°F | Ht 66.0 in | Wt 163.0 lb

## 2021-12-17 DIAGNOSIS — L03211 Cellulitis of face: Secondary | ICD-10-CM

## 2021-12-17 MED ORDER — DOXYCYCLINE HYCLATE 100 MG PO TABS: 100.0000 mg | ORAL_TABLET | Freq: Two times a day (BID) | ORAL | 0 refills | Status: DC

## 2021-12-17 NOTE — Patient Instructions (Signed)
Take Doxycycline 100 mg twice daily for 5 days, with food Monitor area for swelling, warmth, and drainage Follow-up if symptoms fail to improve  Cellulitis, Adult  Cellulitis is a skin infection. The infected area is often warm, red, swollen, and sore. It occurs most often in the arms and lower legs. It is very important to get treated for this condition. What are the causes? This condition is caused by bacteria. The bacteria enter through a break in the skin, such as a cut, burn, insect bite, open sore, or crack. What increases the risk? This condition is more likely to occur in people who: Have a weak body defense system (immune system). Have open cuts, burns, bites, or scrapes on the skin. Are older than 73 years of age. Have a blood sugar problem (diabetes). Have a long-lasting (chronic) liver disease (cirrhosis) or kidney disease. Are very overweight (obese). Have a skin problem, such as: Itchy rash (eczema). Slow movement of blood in the veins (venous stasis). Fluid buildup below the skin (edema). Have been treated with high-energy rays (radiation). Use IV drugs. What are the signs or symptoms? Symptoms of this condition include: Skin that is: Red. Streaking. Spotting. Swollen. Sore or painful when you touch it. Warm. A fever. Chills. Blisters. How is this diagnosed? This condition is diagnosed based on: Medical history. Physical exam. Blood tests. Imaging tests. How is this treated? Treatment for this condition may include: Medicines to treat infections or allergies. Home care, such as: Rest. Placing cold or warm cloths (compresses) on the skin. Hospital care, if the condition is very bad. Follow these instructions at home: Medicines Take over-the-counter and prescription medicines only as told by your doctor. If you were prescribed an antibiotic medicine, take it as told by your doctor. Do not stop taking it even if you start to feel better. General  instructions  Drink enough fluid to keep your pee (urine) pale yellow. Do not touch or rub the infected area. Raise (elevate) the infected area above the level of your heart while you are sitting or lying down. Place cold or warm cloths on the area as told by your doctor. Keep all follow-up visits as told by your doctor. This is important. Contact a doctor if: You have a fever. You do not start to get better after 1-2 days of treatment. Your bone or joint under the infected area starts to hurt after the skin has healed. Your infection comes back. This can happen in the same area or another area. You have a swollen bump in the area. You have new symptoms. You feel ill and have muscle aches and pains. Get help right away if: Your symptoms get worse. You feel very sleepy. You throw up (vomit) or have watery poop (diarrhea) for a long time. You see red streaks coming from the area. Your red area gets larger. Your red area turns dark in color. These symptoms may represent a serious problem that is an emergency. Do not wait to see if the symptoms will go away. Get medical help right away. Call your local emergency services (911 in the [place] Do not drive yourself to the hospital. Summary Cellulitis is a skin infection. The area is often warm, red, swollen, and sore. This condition is treated with medicines, rest, and cold and warm cloths. Take all medicines only as told by your doctor. Tell your doctor if symptoms do not start to get better after 1-2 days of treatment. This information is not intended to replace advice  given to you by your health care provider. Make sure you discuss any questions you have with your health care provider. Document Revised: 10/01/2020 Document Reviewed: 10/02/2020 Elsevier Patient Education  2023 [employer].

## 2021-12-17 NOTE — Progress Notes (Signed)
Acute Office Visit  Subjective:    Patient ID: Virginia Montoya, female    DOB: 1950/03/25, 71 y.o.   MRN: [id number]  CC: Right check swelling  HPI: Patient is in today for swelling and redness under right eye with skin lesion. Pt states she had white to clear drainage from skin lesion a couple nights ago. Denies pain, fever, chills, or headache. Onset was a week ago.  She has not used anything for treatment. Pt speculates that she may have been bitten by a spider while out walking.  Past Medical History:  Diagnosis Date   Chronic insomnia    Depression    Gastroesophageal reflux disease    Hyperlipidemia    Osteoporosis    Precordial chest pain 11/17/2016   Prediabetes     Past Surgical History:  Procedure Laterality Date   CHOLECYSTECTOMY  1999   CORONARY CALCIUM SCORE/CORONARY CTA  01/2017   Coronary calcium score is 0.  Normal coronary origin with normal right dominance.  No evidence of CAD.  Most likely noncardiac chest pain.   SHOULDER SURGERY  2005   TONSILLECTOMY     TUBAL LIGATION  1977    Family History  Problem Relation Age of Onset   Diabetes Mother    Hyperlipidemia Mother    Emphysema Father    Diabetes Maternal Grandmother    Liver cancer Maternal Grandmother     Social History   Socioeconomic History   Marital status: Married    Spouse name: Not on file   Number of children: 3   Years of education: 12   Highest education level: Associate degree: academic program  Occupational History   Occupation: Retired  Tobacco Use   Smoking status: Never   Smokeless tobacco: Never  Substance and Sexual Activity   Alcohol use: No   Drug use: No   Sexual activity: Not Currently  Other Topics Concern   Not on file  Social History Narrative   She is a married (remarried) mother of 3 with 4 grandchildren.  She has 3 great-grandchildren.  She previously worked at [employer] in [place].  She completed high school and beauty school.   She works  outside a lot in the yard on gardening.  She does a lot of walking and exercises with walking for at least a half an hour a day 5 days a week.   wears sunscreen, brushes and flosses daily, see's dentist bi-annually, has smoke/carbon monoxide detectors, wears a seatbelt and practices gun safety   Social Determinants of Health   Financial Resource Strain: Low Risk  (05/07/2021)   Overall Financial Resource Strain (CARDIA)    Difficulty of Paying Living Expenses: Not hard at all  Food Insecurity: No Food Insecurity (05/07/2021)   Hunger Vital Sign    Worried About Running Out of Food in the Last Year: Never true    [place] in the Last Year: Never true  Transportation Needs: No Transportation Needs (05/07/2021)   PRAPARE - Hydrologist (Medical): No    Lack of Transportation (Non-Medical): No  Physical Activity: Insufficiently Active (05/07/2021)   Exercise Vital Sign    Days of Exercise per Week: 5 days    Minutes of Exercise per Session: 20 min  Stress: No Stress Concern Present (05/07/2021)   [place]    Feeling of Stress : Only a little  Social Connections: Moderately Integrated (05/07/2021)  Social Connection and Isolation Panel [NHANES]    Frequency of Communication with Friends and Family: More than three times a week    Frequency of Social Gatherings with Friends and Family: Three times a week    Attends Religious Services: More than 4 times per year    Active Member of Clubs or Organizations: No    Attends Archivist Meetings: Never    Marital Status: Married  Human resources officer Violence: Not At Risk (05/07/2021)   Humiliation, Afraid, Rape, and Kick questionnaire    Fear of Current or Ex-Partner: No    Emotionally Abused: No    Physically Abused: No    Sexually Abused: No    Outpatient Medications Prior to Visit  Medication Sig Dispense Refill   acetaminophen (TYLENOL) 500  MG tablet Take 500 mg every 6 (six) hours as needed by mouth.     cholecalciferol (VITAMIN D3) 25 MCG (1000 UNIT) tablet Take 1,000 Units by mouth 2 (two) times daily.     cyclobenzaprine (FLEXERIL) 5 MG tablet Take 1 tablet (5 mg total) by mouth 3 (three) times daily as needed for muscle spasms. 30 tablet 1   fluticasone (FLONASE) 50 MCG/ACT nasal spray Place 2 sprays into both nostrils daily. 16 g 6   LORazepam (ATIVAN) 0.5 MG tablet TAKE 1 TABLET(0.5 MG) BY MOUTH EVERY 8 HOURS AS NEEDED FOR ANXIETY 90 tablet 0   Multiple Vitamins-Minerals (PRESERVISION AREDS 2 PO) Take by mouth.     omeprazole (PRILOSEC) 20 MG capsule TAKE 1 CAPSULE(20 MG) BY MOUTH DAILY 90 capsule 3   pyridOXINE (VITAMIN B-6) 100 MG tablet Take 100 mg by mouth daily.     rosuvastatin (CRESTOR) 20 MG tablet Take 1 tablet (20 mg total) by mouth at bedtime. 90 tablet 0   venlafaxine XR (EFFEXOR-XR) 150 MG 24 hr capsule TAKE 1 CAPSULE(150 MG) BY MOUTH DAILY WITH BREAKFAST 90 capsule 1   vitamin B-12 (CYANOCOBALAMIN) 1000 MCG tablet Take 1,000 mcg by mouth daily.     zolpidem (AMBIEN) 10 MG tablet TAKE 1 TABLET BY MOUTH AT BEDTIME 30 tablet 5   No facility-administered medications prior to visit.    Allergies  Allergen Reactions   Cephalexin Nausea And Vomiting    ask   Alendronate     Gi upset   Biaxin [Clarithromycin] Other (See Comments)    CHEST PAIN   Sulfa Antibiotics     Made her feel poorly.     Review of Systems See pertinent positives and negatives per HPI.     Objective:    Physical Exam HENT:     Head:      Comments: 83m skin lesion x 2 with mild erythema and swelling under right eye    BP 126/72   Pulse 92   Temp (!) 97.3 F (36.3 C)   Ht _0  (1.676 m)   Wt 163 lb (73.9 kg)   SpO2 97%   BMI 26.31 kg/m   Wt Readings from Last 3 Encounters:  11/17/21 160 lb (72.6 kg)  10/19/21 161 lb 6.4 oz (73.2 kg)  09/29/21 161 lb (73 kg)    Health Maintenance Due  Topic Date Due   Hepatitis C  Screening  Never done   Zoster Vaccines- Shingrix (2 of 2) 12/28/2016   Pneumonia Vaccine 71 Years old (2 - PCV) 10/12/2017   COVID-19 Vaccine (4 - 2023-24 season) 09/04/2021    Lab Results  Component Value Date   TSH 2.240 10/19/2021  Lab Results  Component Value Date   WBC 5.7 10/19/2021   HGB 14.2 10/19/2021   HCT 42.8 10/19/2021   MCV 89 10/19/2021   PLT 345 10/19/2021   Lab Results  Component Value Date   NA 138 10/19/2021   K 4.4 10/19/2021   CO2 25 10/19/2021   GLUCOSE 103 (H) 10/19/2021   BUN 8 10/19/2021   CREATININE 0.60 10/19/2021   BILITOT 0.4 10/19/2021   ALKPHOS 77 10/19/2021   AST 17 10/19/2021   ALT 16 10/19/2021   PROT 6.6 10/19/2021   ALBUMIN 4.6 10/19/2021   CALCIUM 9.2 10/19/2021   EGFR 95 10/19/2021   Lab Results  Component Value Date   CHOL 147 10/19/2021   Lab Results  Component Value Date   HDL 61 10/19/2021   Lab Results  Component Value Date   LDLCALC 61 10/19/2021   Lab Results  Component Value Date   TRIG 147 10/19/2021   Lab Results  Component Value Date   CHOLHDL 2.4 10/19/2021   Lab Results  Component Value Date   HGBA1C 5.9 (H) 10/19/2021       Assessment & Plan:   1. Cellulitis, face - doxycycline (VIBRA-TABS) 100 MG tablet; Take 1 tablet (100 mg total) by mouth 2 (two) times daily.  Dispense: 10 tablet; Refill: 0   Take Doxycycline 100 mg twice daily for 5 days, with food Monitor area for swelling, warmth, and drainage Follow-up if symptoms fail to improve    Follow-up: PRN  An After Visit Summary was printed and given to the patient.  I, Rip Harbour, NP, have reviewed all documentation for this visit. The documentation on 12/17/21 for the exam, diagnosis, procedures, and orders are all accurate and complete.   Signed, Rip Harbour, NP [place] [phone]

## 2021-12-22 ENCOUNTER — Telehealth: Payer: Self-pay

## 2021-12-22 NOTE — Telephone Encounter (Signed)
Patient stated that she is coughing and has had headaches.  She tested herself for covid at home today and it was neg.  She is still taking Doxycycline. She was wanting to know if she needs to come back in or continue taking her medication? Please advise

## 2021-12-23 ENCOUNTER — Encounter: Payer: Self-pay | Admitting: Nurse Practitioner

## 2021-12-23 ENCOUNTER — Ambulatory Visit (INDEPENDENT_AMBULATORY_CARE_PROVIDER_SITE_OTHER): Payer: PPO | Admitting: Nurse Practitioner

## 2021-12-23 ENCOUNTER — Other Ambulatory Visit: Payer: Self-pay | Admitting: Nurse Practitioner

## 2021-12-23 VITALS — BP 126/72 | HR 98 | Temp 97.7°F | Ht 66.0 in | Wt 166.0 lb

## 2021-12-23 DIAGNOSIS — R051 Acute cough: Secondary | ICD-10-CM | POA: Diagnosis not present

## 2021-12-23 DIAGNOSIS — J069 Acute upper respiratory infection, unspecified: Secondary | ICD-10-CM | POA: Diagnosis not present

## 2021-12-23 DIAGNOSIS — F411 Generalized anxiety disorder: Secondary | ICD-10-CM

## 2021-12-23 MED ORDER — BENZONATATE 100 MG PO CAPS: 100.0000 mg | ORAL_CAPSULE | Freq: Two times a day (BID) | ORAL | 0 refills | Status: DC | PRN

## 2021-12-23 NOTE — Patient Instructions (Addendum)
Take Tessalon Perles as needed for cough Rest and push fluids Continue Alka-Seltzer Plus as directed Continue Doxycycline as directed Follow-up as needed  Viral Illness, Adult Viruses are tiny germs that can get into a person's body and cause illness. There are many different types of viruses, and they cause many types of illness. Viral illnesses can range from mild to severe. They can affect various parts of the body. Short-term conditions that are caused by a virus include colds and the flu (influenza). Long-term conditions that are caused by a virus include herpes, shingles, and HIV (human immunodeficiency virus) infection. A few viruses have been linked to certain cancers. What are the causes? Many types of viruses can cause illness. Viruses invade cells in your body, multiply, and cause the infected cells to work abnormally or die. When these cells die, they release more of the virus. When this happens, you develop symptoms of the illness, and the virus continues to spread to other cells. If the virus takes over the function of the cell, it can cause the cell to divide and grow out of control. This happens when a virus causes cancer. Different viruses get into the body in different ways. You can get a virus by: Swallowing food or water that has come in contact with the virus (is contaminated). Breathing in droplets that have been coughed or sneezed into the air by an infected person. Touching a surface that has been contaminated with the virus and then touching your eyes, nose, or mouth. Being bitten by an insect or animal that carries the virus. Having sexual contact with a person who is infected with the virus. Being exposed to blood or fluids that contain the virus, either through an open cut or during a transfusion. If a virus enters your body, your body's defense system (immune system) will try to fight the virus. You may be at higher risk for a viral illness if your immune system is  weak. What are the signs or symptoms? You may have these symptoms, depending on the type of virus and the location of the cells that it invades: Cold and flu viruses: Fever. Headache. Sore throat. Muscle aches. Stuffy nose (nasal congestion). Cough. Digestive system (gastrointestinal) viruses: Fever. Pain in the abdomen. Nausea. Diarrhea. Liver viruses (hepatitis): Loss of appetite. Tiredness. Skin or the white parts of your eyes turning yellow (jaundice). Brain and spinal cord viruses: Fever. Headache. Stiff neck. Nausea and vomiting. Confusion or sleepiness. Skin viruses: Warts. Itching. Rash. Sexually transmitted viruses: Discharge. Swelling. Redness. Rash. How is this diagnosed? This condition may be diagnosed based on one or more of the following: Symptoms. Medical history. Physical exam. Blood test, sample of mucus from your lungs (sputum sample), stool sample, or a swab of body fluids or a skin sore (lesion). How is this treated? Viruses can be hard to treat because they live within cells. Antibiotic medicines do not treat viruses because these medicines do not get inside cells. Treatment for a viral illness may include: Resting and drinking plenty of fluids. Medicines to relieve symptoms. These can include over-the-counter medicine for pain and fever, medicines for cough or congestion, and medicines to relieve diarrhea. Antiviral medicines. These medicines are available only for certain types of viruses. Some viral illnesses can be prevented with vaccinations. A common example is the flu shot. Follow these instructions at home: Medicines Take over-the-counter and prescription medicines only as told by your health care provider. If you were prescribed an antiviral medicine, take it as told  by your health care provider. Do not stop taking the antiviral even if you start to feel better. Be aware of when antibiotics are needed and when they are not needed.  Antibiotics do not treat viruses. You may get an antibiotic if your health care provider thinks that you may have, or are at risk for, a bacterial infection and you have a viral infection. Do not ask for an antibiotic prescription if you have been diagnosed with a viral illness. Antibiotics will not make your illness go away faster. Frequently taking antibiotics when they are not needed can lead to antibiotic resistance. When this develops, the medicine no longer works against the bacteria that it normally fights. General instructions  Drink enough fluids to keep your urine pale yellow. Rest as much as possible. Return to your normal activities as told by your health care provider. Ask your health care provider what activities are safe for you. Keep all follow-up visits as told by your health care provider. This is important. How is this prevented? To reduce your risk of viral illness: Wash your hands often with soap and water for at least 20 seconds. If soap and water are not available, use hand sanitizer. Avoid touching your nose, eyes, and mouth, especially if you have not washed your hands recently. If anyone in your household has a viral infection, clean all household surfaces that may have been in contact with the virus. Use soap and hot water. You may also use bleach that you have added water to (diluted). Stay away from people who are sick with symptoms of a viral infection. Do not share items such as toothbrushes and water bottles with other people. Keep your vaccinations up to date. This includes getting a yearly flu shot. Eat a healthy diet and get plenty of rest. Contact a health care provider if: You have symptoms of a viral illness that do not go away. Your symptoms come back after going away. Your symptoms get worse. Get help right away if you have: Trouble breathing. A severe headache or a stiff neck. Severe vomiting or pain in your abdomen. These symptoms may represent a  serious problem that is an emergency. Do not wait to see if the symptoms will go away. Get medical help right away. Call your local emergency services (911 in the [place] Do not drive yourself to the hospital. Summary Viruses are types of germs that can get into a person's body and cause illness. Viral illnesses can range from mild to severe. They can affect various parts of the body. Viruses can be hard to treat. There are medicines to relieve symptoms, and there are some antiviral medicines. If you were prescribed an antiviral medicine, take it as told by your health care provider. Do not stop taking the antiviral even if you start to feel better. Contact a health care provider if you have symptoms of a viral illness that do not go away. This information is not intended to replace advice given to you by your health care provider. Make sure you discuss any questions you have with your health care provider. Document Revised: 05/07/2019 Document Reviewed: 10/31/2018 Elsevier Patient Education  2023 [employer].

## 2021-12-23 NOTE — Progress Notes (Signed)
Acute Office Visit  Subjective:    Patient ID: Virginia Montoya, female    DOB: Jan 10, 1950, 71 y.o.   MRN: [id number]  CC: cough  HPI: Patient is in today for Upper respiratory symptoms She complains of congestion, nasal congestion, post nasal drip, productive cough with  yellow colored sputum, sneezing, and sore throat. Denies fever, chills, dyspnea, or body aches. Onset of symptoms was a few days ago and staying constant. Treatment has included Alka-Seltzer Plus Cold medicine and drinking plenty of fluids.  Past history is significant for occasional episodes of bronchitis. Patient is non-smoker.Pt is currently taking a course of Doxycycline for a skin infection.   Past Medical History:  Diagnosis Date   Chronic insomnia    Depression    Gastroesophageal reflux disease    Hyperlipidemia    Osteoporosis    Precordial chest pain 11/17/2016   Prediabetes     Past Surgical History:  Procedure Laterality Date   CHOLECYSTECTOMY  1999   CORONARY CALCIUM SCORE/CORONARY CTA  01/2017   Coronary calcium score is 0.  Normal coronary origin with normal right dominance.  No evidence of CAD.  Most likely noncardiac chest pain.   SHOULDER SURGERY  2005   TONSILLECTOMY     TUBAL LIGATION  1977    Family History  Problem Relation Age of Onset   Diabetes Mother    Hyperlipidemia Mother    Emphysema Father    Diabetes Maternal Grandmother    Liver cancer Maternal Grandmother     Social History   Socioeconomic History   Marital status: Married    Spouse name: Not on file   Number of children: 3   Years of education: 12   Highest education level: Associate degree: academic program  Occupational History   Occupation: Retired  Tobacco Use   Smoking status: Never   Smokeless tobacco: Never  Substance and Sexual Activity   Alcohol use: No   Drug use: No   Sexual activity: Not Currently  Other Topics Concern   Not on file  Social History Narrative   She is a married (remarried)  mother of 3 with 4 grandchildren.  She has 3 great-grandchildren.  She previously worked at [employer] in [place].  She completed high school and beauty school.   She works outside a lot in the yard on gardening.  She does a lot of walking and exercises with walking for at least a half an hour a day 5 days a week.   wears sunscreen, brushes and flosses daily, see's dentist bi-annually, has smoke/carbon monoxide detectors, wears a seatbelt and practices gun safety   Social Determinants of Health   Financial Resource Strain: Low Risk  (05/07/2021)   Overall Financial Resource Strain (CARDIA)    Difficulty of Paying Living Expenses: Not hard at all  Food Insecurity: No Food Insecurity (05/07/2021)   Hunger Vital Sign    Worried About Running Out of Food in the Last Year: Never true    [place] in the Last Year: Never true  Transportation Needs: No Transportation Needs (05/07/2021)   PRAPARE - Hydrologist (Medical): No    Lack of Transportation (Non-Medical): No  Physical Activity: Insufficiently Active (05/07/2021)   Exercise Vital Sign    Days of Exercise per Week: 5 days    Minutes of Exercise per Session: 20 min  Stress: No Stress Concern Present (05/07/2021)   [place]  Stress Questionnaire    Feeling of Stress : Only a little  Social Connections: Moderately Integrated (05/07/2021)   Social Connection and Isolation Panel [NHANES]    Frequency of Communication with Friends and Family: More than three times a week    Frequency of Social Gatherings with Friends and Family: Three times a week    Attends Religious Services: More than 4 times per year    Active Member of Clubs or Organizations: No    Attends Archivist Meetings: Never    Marital Status: Married  Human resources officer Violence: Not At Risk (05/07/2021)   Humiliation, Afraid, Rape, and Kick questionnaire    Fear of Current or Ex-Partner: No     Emotionally Abused: No    Physically Abused: No    Sexually Abused: No    Outpatient Medications Prior to Visit  Medication Sig Dispense Refill   acetaminophen (TYLENOL) 500 MG tablet Take 500 mg every 6 (six) hours as needed by mouth.     cholecalciferol (VITAMIN D3) 25 MCG (1000 UNIT) tablet Take 1,000 Units by mouth 2 (two) times daily.     cyclobenzaprine (FLEXERIL) 5 MG tablet Take 1 tablet (5 mg total) by mouth 3 (three) times daily as needed for muscle spasms. 30 tablet 1   doxycycline (VIBRA-TABS) 100 MG tablet Take 1 tablet (100 mg total) by mouth 2 (two) times daily. 10 tablet 0   fluticasone (FLONASE) 50 MCG/ACT nasal spray Place 2 sprays into both nostrils daily. 16 g 6   LORazepam (ATIVAN) 0.5 MG tablet TAKE 1 TABLET(0.5 MG) BY MOUTH EVERY 8 HOURS AS NEEDED FOR ANXIETY 90 tablet 0   Multiple Vitamins-Minerals (PRESERVISION AREDS 2 PO) Take by mouth.     omeprazole (PRILOSEC) 20 MG capsule TAKE 1 CAPSULE(20 MG) BY MOUTH DAILY 90 capsule 3   pyridOXINE (VITAMIN B-6) 100 MG tablet Take 100 mg by mouth daily.     rosuvastatin (CRESTOR) 20 MG tablet Take 1 tablet (20 mg total) by mouth at bedtime. 90 tablet 0   venlafaxine XR (EFFEXOR-XR) 150 MG 24 hr capsule TAKE 1 CAPSULE(150 MG) BY MOUTH DAILY WITH BREAKFAST 90 capsule 1   vitamin B-12 (CYANOCOBALAMIN) 1000 MCG tablet Take 1,000 mcg by mouth daily.     zolpidem (AMBIEN) 10 MG tablet TAKE 1 TABLET BY MOUTH AT BEDTIME 30 tablet 5   No facility-administered medications prior to visit.    Allergies  Allergen Reactions   Cephalexin Nausea And Vomiting    ask   Alendronate     Gi upset   Biaxin [Clarithromycin] Other (See Comments)    CHEST PAIN   Sulfa Antibiotics     Made her feel poorly.     Review of Systems See pertinent positives and negatives per HPI.     Objective:   BP 126/72   Pulse 98   Temp 97.7 F (36.5 C)   Ht _0  (1.676 m)   Wt 166 lb (75.3 kg)   SpO2 97%   BMI 26.79 kg/m   Physical  Exam Vitals reviewed.  Constitutional:      Appearance: Normal appearance.  HENT:     Right Ear: Tympanic membrane normal.     Left Ear: Tympanic membrane normal.     Nose: Congestion present. No rhinorrhea.     Mouth/Throat:     Pharynx: No posterior oropharyngeal erythema.  Cardiovascular:     Rate and Rhythm: Regular rhythm. Tachycardia present.     Pulses: Normal pulses.  Heart sounds: Normal heart sounds.  Pulmonary:     Effort: Pulmonary effort is normal.     Breath sounds: Normal breath sounds.  Abdominal:     General: Bowel sounds are normal.     Palpations: Abdomen is soft.  Neurological:     Mental Status: She is alert.      Wt Readings from Last 3 Encounters:  12/17/21 163 lb (73.9 kg)  11/17/21 160 lb (72.6 kg)  10/19/21 161 lb 6.4 oz (73.2 kg)    Health Maintenance Due  Topic Date Due   Hepatitis C Screening  Never done   Zoster Vaccines- Shingrix (2 of 2) 12/28/2016   Pneumonia Vaccine 10+ Years old (2 - PCV) 10/12/2017   COVID-19 Vaccine (4 - 2023-24 season) 09/04/2021    Lab Results  Component Value Date   TSH 2.240 10/19/2021   Lab Results  Component Value Date   WBC 5.7 10/19/2021   HGB 14.2 10/19/2021   HCT 42.8 10/19/2021   MCV 89 10/19/2021   PLT 345 10/19/2021   Lab Results  Component Value Date   NA 138 10/19/2021   K 4.4 10/19/2021   CO2 25 10/19/2021   GLUCOSE 103 (H) 10/19/2021   BUN 8 10/19/2021   CREATININE 0.60 10/19/2021   BILITOT 0.4 10/19/2021   ALKPHOS 77 10/19/2021   AST 17 10/19/2021   ALT 16 10/19/2021   PROT 6.6 10/19/2021   ALBUMIN 4.6 10/19/2021   CALCIUM 9.2 10/19/2021   EGFR 95 10/19/2021   Lab Results  Component Value Date   CHOL 147 10/19/2021   Lab Results  Component Value Date   HDL 61 10/19/2021   Lab Results  Component Value Date   LDLCALC 61 10/19/2021   Lab Results  Component Value Date   TRIG 147 10/19/2021   Lab Results  Component Value Date   CHOLHDL 2.4 10/19/2021   Lab  Results  Component Value Date   HGBA1C 5.9 (H) 10/19/2021       Assessment & Plan:  1. Viral upper respiratory tract infection - benzonatate (TESSALON) 100 MG capsule; Take 1 capsule (100 mg total) by mouth 2 (two) times daily as needed for cough.  Dispense: 20 capsule; Refill: 0  2. Acute cough - benzonatate (TESSALON) 100 MG capsule; Take 1 capsule (100 mg total) by mouth 2 (two) times daily as needed for cough.  Dispense: 20 capsule; Refill: 0   Take Tessalon Perles as needed for cough Rest and push fluids Continue Alka-Seltzer Plus as directed Continue Doxycycline as directed Follow-up as needed  Follow-up: PRN  An After Visit Summary was printed and given to the patient.  I, Rip Harbour, NP, have reviewed all documentation for this visit. The documentation on 12/23/21 for the exam, diagnosis, procedures, and orders are all accurate and complete.    Signed, Rip Harbour, NP [place] [phone]

## 2021-12-31 DIAGNOSIS — M47816 Spondylosis without myelopathy or radiculopathy, lumbar region: Secondary | ICD-10-CM | POA: Diagnosis not present

## 2021-12-31 DIAGNOSIS — M5126 Other intervertebral disc displacement, lumbar region: Secondary | ICD-10-CM | POA: Diagnosis not present

## 2022-01-07 DIAGNOSIS — H353130 Nonexudative age-related macular degeneration, bilateral, stage unspecified: Secondary | ICD-10-CM | POA: Diagnosis not present

## 2022-01-07 DIAGNOSIS — H26493 Other secondary cataract, bilateral: Secondary | ICD-10-CM | POA: Diagnosis not present

## 2022-01-07 DIAGNOSIS — H16223 Keratoconjunctivitis sicca, not specified as Sjogren's, bilateral: Secondary | ICD-10-CM | POA: Diagnosis not present

## 2022-01-07 DIAGNOSIS — H524 Presbyopia: Secondary | ICD-10-CM | POA: Diagnosis not present

## 2022-01-21 ENCOUNTER — Encounter: Payer: Self-pay | Admitting: Nurse Practitioner

## 2022-01-21 ENCOUNTER — Ambulatory Visit (INDEPENDENT_AMBULATORY_CARE_PROVIDER_SITE_OTHER): Payer: Medicare HMO | Admitting: Nurse Practitioner

## 2022-01-21 VITALS — BP 134/72 | HR 92 | Temp 97.2°F | Ht 66.0 in | Wt 163.0 lb

## 2022-01-21 DIAGNOSIS — R300 Dysuria: Secondary | ICD-10-CM | POA: Diagnosis not present

## 2022-01-21 DIAGNOSIS — H353221 Exudative age-related macular degeneration, left eye, with active choroidal neovascularization: Secondary | ICD-10-CM | POA: Diagnosis not present

## 2022-01-21 DIAGNOSIS — N3000 Acute cystitis without hematuria: Secondary | ICD-10-CM | POA: Diagnosis not present

## 2022-01-21 LAB — POCT URINALYSIS DIPSTICK
Blood, UA: NEGATIVE
Glucose, UA: NEGATIVE
Nitrite, UA: POSITIVE
Protein, UA: NEGATIVE
Spec Grav, UA: 1.025 (ref 1.010–1.025)
Urobilinogen, UA: 2 E.U./dL — AB
pH, UA: 5 (ref 5.0–8.0)

## 2022-01-21 MED ORDER — PHENAZOPYRIDINE HCL 100 MG PO TABS: 100.0000 mg | ORAL_TABLET | Freq: Three times a day (TID) | ORAL | 0 refills | Status: DC | PRN

## 2022-01-21 MED ORDER — CRANBERRY 50 MG PO CHEW: 50.0000 mg | CHEWABLE_TABLET | Freq: Every day | ORAL | 0 refills | Status: DC

## 2022-01-21 MED ORDER — CIPROFLOXACIN HCL 500 MG PO TABS: 500.0000 mg | ORAL_TABLET | Freq: Two times a day (BID) | ORAL | 0 refills | Status: AC

## 2022-01-21 NOTE — Progress Notes (Signed)
Acute Office Visit  Subjective:    Patient ID: Virginia Montoya, female    DOB: [birth year], 72 y.o.   MRN: [id number]  Chief Complaint  Patient presents with   UTI symptoms    HPI Patient is in today for symptoms of UTI ongoing for a week.  URINARY SYMPTOMS  Dysuria: yes Urinary frequency: yes Urgency: yes Small volume voids: yes Symptom severity:  moderate Urinary incontinence: no Foul odor: no Hematuria: no Abdominal pain: no Back pain: no Suprapubic pain/pressure: no Flank pain: no Fever:  yes, no, subjective, and low grade Vomiting: no Relief with cranberry juice:  Not tried-patient taking azo. Relief with pyridium:  Not tried-patient taking azo. Status: worsening Previous urinary tract infection: yes Recurrent urinary tract infection: no Sexual activity: No sexually active/monogomous/practicing safe sex History of sexually transmitted disease: no Treatments attempted:   Past Medical History:  Diagnosis Date   Chronic insomnia    Depression    Gastroesophageal reflux disease    Hyperlipidemia    Osteoporosis    Precordial chest pain 11/17/2016   Prediabetes     Past Surgical History:  Procedure Laterality Date   CHOLECYSTECTOMY  1999   CORONARY CALCIUM SCORE/CORONARY CTA  01/2017   Coronary calcium score is 0.  Normal coronary origin with normal right dominance.  No evidence of CAD.  Most likely noncardiac chest pain.   SHOULDER SURGERY  2005   TONSILLECTOMY     TUBAL LIGATION  1977    Family History  Problem Relation Age of Onset   Diabetes Mother    Hyperlipidemia Mother    Emphysema Father    Diabetes Maternal Grandmother    Liver cancer Maternal Grandmother     Social History   Socioeconomic History   Marital status: Married    Spouse name: Not on file   Number of children: 3   Years of education: 12   Highest education level: Associate degree: academic program  Occupational History   Occupation: Retired  Tobacco Use   Smoking  status: Never   Smokeless tobacco: Never  Substance and Sexual Activity   Alcohol use: No   Drug use: No   Sexual activity: Not Currently  Other Topics Concern   Not on file  Social History Narrative   She is a married (remarried) mother of 3 with 4 grandchildren.  She has 3 great-grandchildren.  She previously worked at [employer] in [place].  She completed high school and beauty school.   She works outside a lot in the yard on gardening.  She does a lot of walking and exercises with walking for at least a half an hour a day 5 days a week.   wears sunscreen, brushes and flosses daily, see's dentist bi-annually, has smoke/carbon monoxide detectors, wears a seatbelt and practices gun safety   Social Determinants of Health   Financial Resource Strain: Low Risk  (05/07/2021)   Overall Financial Resource Strain (CARDIA)    Difficulty of Paying Living Expenses: Not hard at all  Food Insecurity: No Food Insecurity (05/07/2021)   Hunger Vital Sign    Worried About Running Out of Food in the Last Year: Never true    [place] in the Last Year: Never true  Transportation Needs: No Transportation Needs (05/07/2021)   PRAPARE - Hydrologist (Medical): No    Lack of Transportation (Non-Medical): No  Physical Activity: Insufficiently Active (05/07/2021)   Exercise Vital Sign  Days of Exercise per Week: 5 days    Minutes of Exercise per Session: 20 min  Stress: No Stress Concern Present (05/07/2021)   [employer] of Occupational Health - Occupational Stress Questionnaire    Feeling of Stress : Only a little  Social Connections: Moderately Integrated (05/07/2021)   Social Connection and Isolation Panel [NHANES]    Frequency of Communication with Friends and Family: More than three times a week    Frequency of Social Gatherings with Friends and Family: Three times a week    Attends Religious Services: More than 4 times per year    Active Member of Clubs or  Organizations: No    Attends Banker Meetings: Never    Marital Status: Married  Catering manager Violence: Not At Risk (05/07/2021)   Humiliation, Afraid, Rape, and Kick questionnaire    Fear of Current or Ex-Partner: No    Emotionally Abused: No    Physically Abused: No    Sexually Abused: No    Outpatient Medications Prior to Visit  Medication Sig Dispense Refill   acetaminophen (TYLENOL) 500 MG tablet Take 500 mg every 6 (six) hours as needed by mouth.     cholecalciferol (VITAMIN D3) 25 MCG (1000 UNIT) tablet Take 1,000 Units by mouth 2 (two) times daily.     fluticasone (FLONASE) 50 MCG/ACT nasal spray Place 2 sprays into both nostrils daily. 16 g 6   LORazepam (ATIVAN) 0.5 MG tablet TAKE 1 TABLET(0.5 MG) BY MOUTH EVERY 8 HOURS AS NEEDED FOR ANXIETY 90 tablet 0   Multiple Vitamins-Minerals (PRESERVISION AREDS 2 PO) Take by mouth.     omeprazole (PRILOSEC) 20 MG capsule TAKE 1 CAPSULE(20 MG) BY MOUTH DAILY 90 capsule 3   pyridOXINE (VITAMIN B-6) 100 MG tablet Take 100 mg by mouth daily.     rosuvastatin (CRESTOR) 20 MG tablet Take 1 tablet (20 mg total) by mouth at bedtime. 90 tablet 0   venlafaxine XR (EFFEXOR-XR) 150 MG 24 hr capsule TAKE 1 CAPSULE(150 MG) BY MOUTH DAILY WITH BREAKFAST 90 capsule 1   vitamin B-12 (CYANOCOBALAMIN) 1000 MCG tablet Take 1,000 mcg by mouth daily.     zolpidem (AMBIEN) 10 MG tablet TAKE 1 TABLET BY MOUTH AT BEDTIME 30 tablet 5   benzonatate (TESSALON) 100 MG capsule Take 1 capsule (100 mg total) by mouth 2 (two) times daily as needed for cough. 20 capsule 0   cyclobenzaprine (FLEXERIL) 5 MG tablet Take 1 tablet (5 mg total) by mouth 3 (three) times daily as needed for muscle spasms. 30 tablet 1   doxycycline (VIBRA-TABS) 100 MG tablet Take 1 tablet (100 mg total) by mouth 2 (two) times daily. 10 tablet 0   No facility-administered medications prior to visit.    Allergies  Allergen Reactions   Cephalexin Nausea And Vomiting    ask    Alendronate     Gi upset   Biaxin [Clarithromycin] Other (See Comments)    CHEST PAIN   Macrobid [Nitrofurantoin] Nausea And Vomiting    Stomach upset   Sulfa Antibiotics     Made her feel poorly.    ROS: See pertinent positives and negatives per HPI.     Objective:    Physical Exam Vitals reviewed.  Constitutional:      Appearance: Normal appearance. She is normal weight.  Cardiovascular:     Rate and Rhythm: Normal rate and regular rhythm.     Heart sounds: Normal heart sounds.  Pulmonary:     Effort:  Pulmonary effort is normal.     Breath sounds: Normal breath sounds.  Abdominal:     General: Abdomen is flat. Bowel sounds are normal.     Palpations: Abdomen is soft.     Tenderness: There is abdominal tenderness in the suprapubic area. There is no right CVA tenderness, left CVA tenderness, guarding or rebound.  Neurological:     Mental Status: She is alert and oriented to person, place, and time.  Psychiatric:        Mood and Affect: Mood normal.        Behavior: Behavior normal.     BP 134/72 (BP Location: Left Arm, Patient Position: Sitting)   Pulse 92   Temp (!) 97.2 F (36.2 C) (Temporal)   Ht 5\' 6"  (1.676 m)   Wt 163 lb (73.9 kg)   SpO2 98%   BMI 26.31 kg/m  Wt Readings from Last 3 Encounters:  01/21/22 163 lb (73.9 kg)  12/23/21 166 lb (75.3 kg)  12/17/21 163 lb (73.9 kg)    Health Maintenance Due  Topic Date Due   Hepatitis C Screening  Never done   Zoster Vaccines- Shingrix (2 of 2) 12/28/2016   Pneumonia Vaccine 61+ Years old (2 - PCV) 10/12/2017   COVID-19 Vaccine (4 - 2023-24 season) 09/04/2021    There are no preventive care reminders to display for this patient.   Lab Results  Component Value Date   TSH 2.240 10/19/2021   Lab Results  Component Value Date   WBC 5.7 10/19/2021   HGB 14.2 10/19/2021   HCT 42.8 10/19/2021   MCV 89 10/19/2021   PLT 345 10/19/2021   Lab Results  Component Value Date   NA 138 10/19/2021   K 4.4  10/19/2021   CO2 25 10/19/2021   GLUCOSE 103 (H) 10/19/2021   BUN 8 10/19/2021   CREATININE 0.60 10/19/2021   BILITOT 0.4 10/19/2021   ALKPHOS 77 10/19/2021   AST 17 10/19/2021   ALT 16 10/19/2021   PROT 6.6 10/19/2021   ALBUMIN 4.6 10/19/2021   CALCIUM 9.2 10/19/2021   EGFR 95 10/19/2021   Lab Results  Component Value Date   CHOL 147 10/19/2021   Lab Results  Component Value Date   HDL 61 10/19/2021   Lab Results  Component Value Date   LDLCALC 61 10/19/2021   Lab Results  Component Value Date   TRIG 147 10/19/2021   Lab Results  Component Value Date   CHOLHDL 2.4 10/19/2021   Lab Results  Component Value Date   HGBA1C 5.9 (H) 10/19/2021      Assessment & Plan:   Acute cystitis without hematuria Assessment & Plan: Start antibiotic Take cranberry pills everyday Drink lots of water  Increase fluid intake to a minimum of 8, 8 ounce glasses of water per day to help flush the kidneys and bladder.  The medication Pyridium may help with symptoms of bladder spasms, abdominal pain, and burning. This medication will turn your urine bright orange. This is expected and nothing to be concerned about. I have prescribed this for you in addition to the antibiotic.   If you begin to have worsening symptoms, low back pain, fevers, chills, nausea, vomiting, diarrhea, or decreased urine, please let us know.  Please finish all of your antibiotic even if you begin to feel better before you have completed the full course. It takes the full dose to be completely effective against the bacteria present. Stopping the antibiotic early can lead to  the bacteria becoming resistant to the antibiotic and puts you at risk of the antibiotic not working for you in the future  Orders: -     Cranberry; Chew 50 mg by mouth daily.  Dispense: 90 tablet; Refill: 0 -     Ciprofloxacin HCl; Take 1 tablet (500 mg total) by mouth 2 (two) times daily for 5 days.  Dispense: 10 tablet; Refill:  0  Dysuria -     POCT urinalysis dipstick -     Phenazopyridine HCl; Take 1 tablet (100 mg total) by mouth 3 (three) times daily as needed for pain.  Dispense: 10 tablet; Refill: 0    Will sent urine culture. Follow UP: as needed  I, Jodean Valade have reviewed all documentation for this visit. The documentation on 01/21/22   for the exam, diagnosis, procedures, and orders are all accurate and complete.     An After Visit Summary was printed and given to the patient.   Lurline Del, DNP, FNP [facility] [phone]

## 2022-01-21 NOTE — Patient Instructions (Addendum)
Take cranberry pills everyday Drink lots of water  Increase fluid intake to a minimum of 8, 8 ounce glasses of water per day to help flush the kidneys and bladder.  The medication Pyridium may help with symptoms of bladder spasms, abdominal pain, and burning. This medication will turn your urine bright orange. This is expected and nothing to be concerned about. I have prescribed this for you in addition to the antibiotic.   If you begin to have worsening symptoms, low back pain, fevers, chills, nausea, vomiting, diarrhea, or decreased urine, please let us know.  Please finish all of your antibiotic even if you begin to feel better before you have completed the full course. It takes the full dose to be completely effective against the bacteria present. Stopping the antibiotic early can lead to the bacteria becoming resistant to the antibiotic and puts you at risk of the antibiotic not working for you in the future.

## 2022-01-21 NOTE — Addendum Note (Signed)
Addended by: Philipp Ovens L on: 01/21/2022 04:51 PM   Modules accepted: Orders

## 2022-01-21 NOTE — Assessment & Plan Note (Signed)
Start antibiotic Take cranberry pills everyday Drink lots of water  Increase fluid intake to a minimum of 8, 8 ounce glasses of water per day to help flush the kidneys and bladder.  The medication Pyridium may help with symptoms of bladder spasms, abdominal pain, and burning. This medication will turn your urine bright orange. This is expected and nothing to be concerned about. I have prescribed this for you in addition to the antibiotic.   If you begin to have worsening symptoms, low back pain, fevers, chills, nausea, vomiting, diarrhea, or decreased urine, please let us know.  Please finish all of your antibiotic even if you begin to feel better before you have completed the full course. It takes the full dose to be completely effective against the bacteria present. Stopping the antibiotic early can lead to the bacteria becoming resistant to the antibiotic and puts you at risk of the antibiotic not working for you in the future

## 2022-01-23 LAB — URINE CULTURE

## 2022-01-26 DIAGNOSIS — M47816 Spondylosis without myelopathy or radiculopathy, lumbar region: Secondary | ICD-10-CM | POA: Diagnosis not present

## 2022-01-26 DIAGNOSIS — Z6826 Body mass index (BMI) 26.0-26.9, adult: Secondary | ICD-10-CM | POA: Diagnosis not present

## 2022-01-27 ENCOUNTER — Ambulatory Visit: Payer: Medicare HMO | Admitting: Family Medicine

## 2022-01-27 VITALS — BP 134/76 | HR 78 | Temp 97.1°F | Resp 16 | Ht 66.0 in | Wt 161.6 lb

## 2022-01-27 DIAGNOSIS — F411 Generalized anxiety disorder: Secondary | ICD-10-CM | POA: Diagnosis not present

## 2022-01-27 DIAGNOSIS — R7303 Prediabetes: Secondary | ICD-10-CM | POA: Diagnosis not present

## 2022-01-27 DIAGNOSIS — F330 Major depressive disorder, recurrent, mild: Secondary | ICD-10-CM | POA: Diagnosis not present

## 2022-01-27 DIAGNOSIS — M48062 Spinal stenosis, lumbar region with neurogenic claudication: Secondary | ICD-10-CM | POA: Diagnosis not present

## 2022-01-27 DIAGNOSIS — N3000 Acute cystitis without hematuria: Secondary | ICD-10-CM | POA: Diagnosis not present

## 2022-01-27 DIAGNOSIS — M25562 Pain in left knee: Secondary | ICD-10-CM | POA: Diagnosis not present

## 2022-01-27 DIAGNOSIS — G609 Hereditary and idiopathic neuropathy, unspecified: Secondary | ICD-10-CM

## 2022-01-27 DIAGNOSIS — G8929 Other chronic pain: Secondary | ICD-10-CM

## 2022-01-27 DIAGNOSIS — K219 Gastro-esophageal reflux disease without esophagitis: Secondary | ICD-10-CM

## 2022-01-27 DIAGNOSIS — I70219 Atherosclerosis of native arteries of extremities with intermittent claudication, unspecified extremity: Secondary | ICD-10-CM

## 2022-01-27 DIAGNOSIS — M810 Age-related osteoporosis without current pathological fracture: Secondary | ICD-10-CM

## 2022-01-27 DIAGNOSIS — E782 Mixed hyperlipidemia: Secondary | ICD-10-CM | POA: Diagnosis not present

## 2022-01-27 LAB — POCT URINALYSIS DIPSTICK
Bilirubin, UA: NEGATIVE
Blood, UA: NEGATIVE
Glucose, UA: NEGATIVE
Ketones, UA: NEGATIVE
Nitrite, UA: NEGATIVE
Protein, UA: POSITIVE — AB
Spec Grav, UA: 1.015 (ref 1.010–1.025)
Urobilinogen, UA: 0.2 E.U./dL
pH, UA: 6 (ref 5.0–8.0)

## 2022-01-27 MED ORDER — DOXYCYCLINE HYCLATE 100 MG PO TABS: 100.0000 mg | ORAL_TABLET | Freq: Two times a day (BID) | ORAL | 0 refills | Status: AC

## 2022-01-27 NOTE — Progress Notes (Unsigned)
Subjective:  Patient ID: Virginia Montoya, female    DOB: 1950-09-15  Age: 72 y.o. MRN: [id number]  Chief Complaint  Patient presents with   Hyperlipidemia   Gastroesophageal Reflux   Prediabetes    HPI  Osteoporosis. Vitamin D 1000 U qd.  Patient does not tolerate calcium due to constipation however she does eat cheese.  Avoids milk.  Evenity has been too expensive.  Currently taking Prolia.   GAD/Depression: on effexor xr 150 mg one daily in am. Lorazepam 0.5 mg half pill to 1 pill as needed during the day and patient takes 1 pill in evening. Takes ambien at night for sleep.   Prediabetes: Walking for exercise.  Eats fairly healthy.  GERD. On omeprazole 20 mg daily and pepcid 20 mg daily.  Well-controlled.  Macular degeneration: sees eye doctor. shots once monthly.   Hyperlipidemia: on crestor 20 mg before bed.  Eats fairly healthy.  Complaining of right knee pain x 6 months. Improved with kenalog injection and requesting one today.  Complaining of cold feet. No pain.   Patient saw Dr. Ronnald Ramp yesterday. He did not recommend surgery. When very active pain is 9/10. Improved with sitting, but does not fully resolve. Neither Tramadol nor flexeril help and flexeril seemed to cause a headache. Discontinue both those and now is usiing ibuprofen and some times tramadol.   Treated for UTI on 01/21/2022 and started on cipro. Caused bad reflux. Still having some bladder symptoms.       01/27/2022    9:25 AM 10/19/2021    9:29 AM 07/13/2021    9:05 AM 05/07/2021    3:29 PM 04/21/2021   10:29 AM  Depression screen PHQ 2/9  Decreased Interest 0 0 0 0 0  Down, Depressed, Hopeless 1 0 0 0 0  PHQ - 2 Score 1 0 0 0 0  Altered sleeping 1 1 0    Tired, decreased energy 0 0 0    Change in appetite 0 0 0    Feeling bad or failure about yourself  0 0 0    Trouble concentrating 0 0 0    Moving slowly or fidgety/restless 0 0 0    Suicidal thoughts 0 0 0    PHQ-9 Score 2 1 0    Difficult doing  work/chores Not difficult at all Not difficult at all Not difficult at all           05/29/2020    8:27 AM 11/10/2020   11:27 AM 04/21/2021   10:29 AM 05/07/2021    3:32 PM 01/27/2022    9:24 AM  Fall Risk  Falls in the past year? 1 0 0 0 0  Was there an injury with Fall? 0 0 0 0 0  Fall Risk Category Calculator 1 0 0 0 0  Fall Risk Category (Retired) Low Low Low Low   (RETIRED) Patient Fall Risk Level  Low fall risk Low fall risk Low fall risk   Patient at Risk for Falls Due to  Impaired balance/gait  No Fall Risks History of fall(s)  Fall risk Follow up  Falls evaluation completed  Falls prevention discussed Falls evaluation completed      Review of Systems  Constitutional:  Negative for chills, fatigue and fever.  HENT:  Negative for congestion, rhinorrhea and sore throat.   Respiratory:  Negative for cough and shortness of breath.   Cardiovascular:  Positive for chest pain (thinks it is related to her anxiety. not really a  panic attack.).  Gastrointestinal:  Negative for abdominal pain, constipation, diarrhea, nausea and vomiting.  Genitourinary:  Negative for dysuria and urgency.       Completed four doses of her antibiotic for her recent UTI.  Had to stop because of severe GERD.  Musculoskeletal:  Positive for arthralgias (right knee pain) and back pain. Negative for myalgias.  Neurological:  Negative for dizziness, weakness, light-headedness and headaches.  Psychiatric/Behavioral:  Negative for dysphoric mood. The patient is not nervous/anxious.     Current Outpatient Medications on File Prior to Visit  Medication Sig Dispense Refill   acetaminophen (TYLENOL) 500 MG tablet Take 500 mg every 6 (six) hours as needed by mouth.     cholecalciferol (VITAMIN D3) 25 MCG (1000 UNIT) tablet Take 1,000 Units by mouth 2 (two) times daily.     fluticasone (FLONASE) 50 MCG/ACT nasal spray Place 2 sprays into both nostrils daily. 16 g 6   LORazepam (ATIVAN) 0.5 MG tablet TAKE 1 TABLET(0.5  MG) BY MOUTH EVERY 8 HOURS AS NEEDED FOR ANXIETY 90 tablet 0   Multiple Vitamins-Minerals (PRESERVISION AREDS 2 PO) Take by mouth.     omeprazole (PRILOSEC) 20 MG capsule TAKE 1 CAPSULE(20 MG) BY MOUTH DAILY 90 capsule 3   rosuvastatin (CRESTOR) 20 MG tablet Take 1 tablet (20 mg total) by mouth at bedtime. 90 tablet 0   venlafaxine XR (EFFEXOR-XR) 150 MG 24 hr capsule TAKE 1 CAPSULE(150 MG) BY MOUTH DAILY WITH BREAKFAST 90 capsule 1   vitamin B-12 (CYANOCOBALAMIN) 1000 MCG tablet Take 1,000 mcg by mouth daily.     zolpidem (AMBIEN) 10 MG tablet TAKE 1 TABLET BY MOUTH AT BEDTIME 30 tablet 5   No current facility-administered medications on file prior to visit.   Past Medical History:  Diagnosis Date   Chronic insomnia    Depression    Gastroesophageal reflux disease    Hyperlipidemia    Osteoporosis    Precordial chest pain 11/17/2016   Prediabetes    Past Surgical History:  Procedure Laterality Date   CHOLECYSTECTOMY  1999   CORONARY CALCIUM SCORE/CORONARY CTA  01/2017   Coronary calcium score is 0.  Normal coronary origin with normal right dominance.  No evidence of CAD.  Most likely noncardiac chest pain.   SHOULDER SURGERY  2005   TONSILLECTOMY     TUBAL LIGATION  1977    Family History  Problem Relation Age of Onset   Diabetes Mother    Hyperlipidemia Mother    Emphysema Father    Diabetes Maternal Grandmother    Liver cancer Maternal Grandmother    Social History   Socioeconomic History   Marital status: Married    Spouse name: Not on file   Number of children: 3   Years of education: 12   Highest education level: Associate degree: academic program  Occupational History   Occupation: Retired  Tobacco Use   Smoking status: Never   Smokeless tobacco: Never  Substance and Sexual Activity   Alcohol use: No   Drug use: No   Sexual activity: Not Currently  Other Topics Concern   Not on file  Social History Narrative   She is a married (remarried) mother of 3  with 4 grandchildren.  She has 3 great-grandchildren.  She previously worked at [employer] in the [employer].  She completed high school and beauty school.   She works outside a lot in the yard on gardening.  She does a lot of walking and exercises with walking  for at least a half an hour a day 5 days a week.   wears sunscreen, brushes and flosses daily, see's dentist bi-annually, has smoke/carbon monoxide detectors, wears a seatbelt and practices gun safety   Social Determinants of Health   Financial Resource Strain: Low Risk  (05/07/2021)   Overall Financial Resource Strain (CARDIA)    Difficulty of Paying Living Expenses: Not hard at all  Food Insecurity: No Food Insecurity (05/07/2021)   Hunger Vital Sign    Worried About Running Out of Food in the Last Year: Never true    Ran Out of Food in the Last Year: Never true  Transportation Needs: No Transportation Needs (05/07/2021)   PRAPARE - Administrator, Civil Service (Medical): No    Lack of Transportation (Non-Medical): No  Physical Activity: Insufficiently Active (05/07/2021)   Exercise Vital Sign    Days of Exercise per Week: 5 days    Minutes of Exercise per Session: 20 min  Stress: No Stress Concern Present (05/07/2021)   [employer] of Occupational Health - Occupational Stress Questionnaire    Feeling of Stress : Only a little  Social Connections: Moderately Integrated (05/07/2021)   Social Connection and Isolation Panel [NHANES]    Frequency of Communication with Friends and Family: More than three times a week    Frequency of Social Gatherings with Friends and Family: Three times a week    Attends Religious Services: More than 4 times per year    Active Member of Clubs or Organizations: No    Attends Banker Meetings: Never    Marital Status: Married    Objective:  BP 134/76   Pulse 78   Temp (!) 97.1 F (36.2 C)   Resp 16   Ht 5\' 6"  (1.676 m)   Wt 161 lb 9.6 oz (73.3 kg)   BMI 26.08 kg/m       01/27/2022    9:17 AM 01/21/2022    1:44 PM 12/23/2021    9:57 AM  BP/Weight  Systolic BP 134 134 126  Diastolic BP 76 72 72  Wt. (Lbs) 161.6 163 166  BMI 26.08 kg/m2 26.31 kg/m2 26.79 kg/m2    Physical Exam Vitals reviewed.  Constitutional:      Appearance: Normal appearance. She is normal weight.  Neck:     Vascular: No carotid bruit.  Cardiovascular:     Rate and Rhythm: Normal rate and regular rhythm.     Pulses:          Dorsalis pedis pulses are 1+ on the right side and 1+ on the left side.     Heart sounds: Normal heart sounds.     Comments: Blue toes BL, cold. Blanch and color returns.  Pulmonary:     Effort: Pulmonary effort is normal. No respiratory distress.     Breath sounds: Normal breath sounds.  Abdominal:     General: Abdomen is flat. Bowel sounds are normal.     Palpations: Abdomen is soft.     Tenderness: There is no abdominal tenderness.  Musculoskeletal:        General: Tenderness present.  Neurological:     Mental Status: She is alert and oriented to person, place, and time.  Psychiatric:        Mood and Affect: Mood normal.        Behavior: Behavior normal.     Diabetic Foot Exam - Simple   No data filed      Lab Results  Component Value Date   WBC 5.7 10/19/2021   HGB 14.2 10/19/2021   HCT 42.8 10/19/2021   PLT 345 10/19/2021   GLUCOSE 103 (H) 10/19/2021   CHOL 147 10/19/2021   TRIG 147 10/19/2021   HDL 61 10/19/2021   LDLCALC 61 10/19/2021   ALT 16 10/19/2021   AST 17 10/19/2021   NA 138 10/19/2021   K 4.4 10/19/2021   CL 102 10/19/2021   CREATININE 0.60 10/19/2021   BUN 8 10/19/2021   CO2 25 10/19/2021   TSH 2.240 10/19/2021   HGBA1C 5.9 (H) 10/19/2021   MICROALBUR 10 05/29/2020      Assessment & Plan:    Acute cystitis without hematuria Assessment & Plan: Ordered UA and Urine culture. Sent doxycycline (antibiotic) for bladder infection.   Orders: -     POCT urinalysis dipstick -     Urine Culture -      Doxycycline Hyclate; Take 1 tablet (100 mg total) by mouth 2 (two) times daily for 5 days.  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  After consent was obtained, using sterile technique the knee was prepped with alcohol.  Left knee injected with Kenalog 80 mg and 5 ml plain Lidocaine was then injected and the needle withdrawn.  The procedure was well tolerated.   The patient is asked to continue to rest the joint for a few more days before resuming regular activities.  It may be more painful for the first 1-2 days.  Watch for fever, or increased swelling or persistent pain in the joint. Call or return to clinic prn if such symptoms occur or there is failure to improve as anticipated.   Orders: -     Triamcinolone Acetonide  Prediabetes Assessment & Plan: Recommend continue to work on eating healthy diet and exercise.    Gastroesophageal reflux disease without esophagitis Assessment & Plan: The current medical regimen is effective;  continue present plan and medications.    GAD (generalized anxiety disorder) Assessment & Plan: The current medical regimen is effective;  continue present plan and medications.    Mild recurrent major depression (HCC) Assessment & Plan: The current medical regimen is effective;  continue present plan and medications.    Mixed hyperlipidemia Assessment & Plan: Well controlled.  No changes to medicines. Continue crestor 20 mg before bed.  Continue to work on eating a healthy diet and exercise.  Labs drawn today.     Spinal stenosis of lumbar region with neurogenic claudication Assessment & Plan: Defer management to  neurosurgery.    Age-related osteoporosis without current pathological fracture Assessment & Plan: The current medical regimen is effective;  continue present plan and medications.       Meds ordered this encounter  Medications   doxycycline (VIBRA-TABS) 100 MG tablet    Sig: Take 1 tablet (100 mg total) by mouth 2 (two) times daily for 5 days.    Dispense:  10 tablet    Refill:  0   triamcinolone acetonide (KENALOG-40) injection 80 mg    Orders Placed This Encounter  Procedures   Urine Culture   POCT urinalysis dipstick   VAS [place] ABI WITH/WO TBI  Follow-up: Return in about 3 weeks (around 02/17/2022) for Nurse visit (covid vaccine) follow up in 3 months. not fasting. Marland Kitchen  An After Visit Summary was printed and given to the patient.  I,Nova Schmuhl,acting as a Education administrator for Rochel Brome, MD.,have documented all relevant documentation on the behalf of Rochel Brome, MD,as directed by  Rochel Brome, MD while in the presence of Rochel Brome, MD.    Rochel Brome, MD [place] [phone]

## 2022-01-27 NOTE — Patient Instructions (Signed)
Sent doxycycline (antibiotic) for bladder infection.

## 2022-01-28 LAB — URINE CULTURE

## 2022-01-29 ENCOUNTER — Telehealth: Payer: Self-pay

## 2022-01-29 DIAGNOSIS — M810 Age-related osteoporosis without current pathological fracture: Secondary | ICD-10-CM

## 2022-01-29 MED ORDER — DENOSUMAB 60 MG/ML ~~LOC~~ SOSY: 60.0000 mg | PREFILLED_SYRINGE | SUBCUTANEOUS | 1 refills | Status: DC

## 2022-01-29 NOTE — Addendum Note (Signed)
Addended by: Erie Noe on: 01/29/2022 11:02 AM   Modules accepted: Orders

## 2022-01-29 NOTE — Telephone Encounter (Signed)
LM for patient to return call in regards to Prolia.  Her next injection is due 02/14/22.  Per [facility] no authorization is needed.  If patient agrees I will send it to [facility] so they can give her a price if they ship it to the office.

## 2022-01-29 NOTE — Telephone Encounter (Signed)
Patient agreed to sending RX to [facility].

## 2022-01-31 DIAGNOSIS — I70219 Atherosclerosis of native arteries of extremities with intermittent claudication, unspecified extremity: Secondary | ICD-10-CM | POA: Insufficient documentation

## 2022-01-31 MED ORDER — TRIAMCINOLONE ACETONIDE 40 MG/ML IJ SUSP: 80.0000 mg | Freq: Once | INTRAMUSCULAR | Status: DC

## 2022-01-31 NOTE — Assessment & Plan Note (Signed)
Risks were discussed including bleeding, infection, increase in sugars if diabetic, atrophy at site of injection, and increased pain.  After consent was obtained, using sterile technique the knee was prepped with alcohol.  Left knee injected with Kenalog 80 mg and 5 ml plain Lidocaine was then injected and the needle withdrawn.  The procedure was well tolerated.   The patient is asked to continue to rest the joint for a few more days before resuming regular activities.  It may be more painful for the first 1-2 days.  Watch for fever, or increased swelling or persistent pain in the joint. Call or return to clinic prn if such symptoms occur or there is failure to improve as anticipated.

## 2022-01-31 NOTE — Assessment & Plan Note (Deleted)
Ordered Vascular ultrasound on left leg

## 2022-01-31 NOTE — Assessment & Plan Note (Addendum)
Ordered UA and Urine culture. Sent doxycycline (antibiotic) for bladder infection.

## 2022-01-31 NOTE — Assessment & Plan Note (Addendum)
Ordered Vascular ABI

## 2022-02-05 ENCOUNTER — Encounter: Payer: Self-pay | Admitting: Family Medicine

## 2022-02-05 NOTE — Assessment & Plan Note (Signed)
The current medical regimen is effective;  continue present plan and medications.  

## 2022-02-06 NOTE — Assessment & Plan Note (Signed)
The current medical regimen is effective;  continue present plan and medications.  

## 2022-02-06 NOTE — Assessment & Plan Note (Signed)
>>  ASSESSMENT AND PLAN FOR MILD RECURRENT MAJOR DEPRESSION (HCC) WRITTEN ON 02/06/2022  9:24 AM BY Jaunita Mikels, MD  The current medical regimen is effective;  continue present plan and medications.

## 2022-02-06 NOTE — Assessment & Plan Note (Signed)
Well controlled.  No changes to medicines. Continue crestor 20 mg before bed.  Continue to work on eating a healthy diet and exercise.  Labs drawn today.   

## 2022-02-06 NOTE — Assessment & Plan Note (Signed)
Defer management to neurosurgery.

## 2022-02-06 NOTE — Assessment & Plan Note (Signed)
Recommend continue to work on eating healthy diet and exercise.  

## 2022-02-08 DIAGNOSIS — M47816 Spondylosis without myelopathy or radiculopathy, lumbar region: Secondary | ICD-10-CM | POA: Diagnosis not present

## 2022-02-08 DIAGNOSIS — S22080S Wedge compression fracture of T11-T12 vertebra, sequela: Secondary | ICD-10-CM | POA: Diagnosis not present

## 2022-02-08 DIAGNOSIS — M5136 Other intervertebral disc degeneration, lumbar region: Secondary | ICD-10-CM | POA: Diagnosis not present

## 2022-02-08 DIAGNOSIS — M546 Pain in thoracic spine: Secondary | ICD-10-CM | POA: Diagnosis not present

## 2022-02-08 DIAGNOSIS — Z6826 Body mass index (BMI) 26.0-26.9, adult: Secondary | ICD-10-CM | POA: Diagnosis not present

## 2022-02-10 ENCOUNTER — Other Ambulatory Visit: Payer: Self-pay | Admitting: Family Medicine

## 2022-02-10 ENCOUNTER — Other Ambulatory Visit: Payer: Self-pay

## 2022-02-10 DIAGNOSIS — F411 Generalized anxiety disorder: Secondary | ICD-10-CM

## 2022-02-10 MED ORDER — LORAZEPAM 0.5 MG PO TABS: 0.5000 mg | ORAL_TABLET | Freq: Three times a day (TID) | ORAL | 1 refills | Status: DC | PRN

## 2022-02-17 ENCOUNTER — Ambulatory Visit (INDEPENDENT_AMBULATORY_CARE_PROVIDER_SITE_OTHER): Payer: Medicare HMO

## 2022-02-17 DIAGNOSIS — M810 Age-related osteoporosis without current pathological fracture: Secondary | ICD-10-CM

## 2022-02-17 MED ORDER — DENOSUMAB 60 MG/ML ~~LOC~~ SOSY
60.0000 mg | PREFILLED_SYRINGE | Freq: Once | SUBCUTANEOUS | Status: AC
  Administered 2022-02-17: 60 mg via SUBCUTANEOUS

## 2022-02-24 ENCOUNTER — Ambulatory Visit: Payer: Medicare HMO

## 2022-03-02 ENCOUNTER — Telehealth: Payer: Self-pay

## 2022-03-02 NOTE — Telephone Encounter (Signed)
Patient called asking if she can have the covid vaccine a day before she is scheduled to have an eye injection. Per Gay Filler, patient advised to contact eye doctor to confirm if it is ok. Patient expressed understanding

## 2022-03-03 ENCOUNTER — Ambulatory Visit: Payer: Medicare HMO

## 2022-03-03 DIAGNOSIS — Z23 Encounter for immunization: Secondary | ICD-10-CM

## 2022-03-04 DIAGNOSIS — H353221 Exudative age-related macular degeneration, left eye, with active choroidal neovascularization: Secondary | ICD-10-CM | POA: Diagnosis not present

## 2022-03-09 ENCOUNTER — Ambulatory Visit: Payer: Medicare HMO | Attending: Family Medicine

## 2022-03-09 DIAGNOSIS — I70219 Atherosclerosis of native arteries of extremities with intermittent claudication, unspecified extremity: Secondary | ICD-10-CM

## 2022-03-09 DIAGNOSIS — I70212 Atherosclerosis of native arteries of extremities with intermittent claudication, left leg: Secondary | ICD-10-CM

## 2022-03-11 LAB — VAS US ABI WITH/WO TBI
Left ABI: 1.07
Right ABI: 1.05

## 2022-03-15 ENCOUNTER — Other Ambulatory Visit: Payer: Self-pay | Admitting: Family Medicine

## 2022-03-15 DIAGNOSIS — K219 Gastro-esophageal reflux disease without esophagitis: Secondary | ICD-10-CM

## 2022-03-17 ENCOUNTER — Other Ambulatory Visit: Payer: Self-pay

## 2022-03-17 ENCOUNTER — Telehealth: Payer: Self-pay

## 2022-03-17 DIAGNOSIS — K219 Gastro-esophageal reflux disease without esophagitis: Secondary | ICD-10-CM

## 2022-03-17 NOTE — Progress Notes (Signed)
Care Management & Coordination Services Pharmacy Team  Reason for Encounter: General adherence update   Contacted patient for general health update and medication adherence call.  Unsuccessful outreach. Left voicemail for patient to return call.    Chart Updates: Recent office visits:  03-03-2022 covid booster given  02-17-2022 Leal-BorjasLemmie Evens I, CMA. Covid vaccine and prolia given  01-27-2022 Rochel Brome, MD. Abnormal UA. VAS [place] ABI WITH/WO TBI ordered. Completed course of cranberry, pyricium and vit B6. Kenelog given.START Doxycycline 100 mg twice daily.  01-21-2022 Neil Crouch, FNP. Abnormal UA. START Cipro 500 mg twice daily, Cranberry 50 mg daily and pyridium 100 mg 3 times daily PRN. FINISHED flexeril, tessalon and doxycycline  12-23-2021 Rip Harbour, NP. Visit for viral upper respiratory infection. START tessalon 100 mg twice daily PRN.  12-17-2021 Rip Harbour, NP. Visit for cellitus on face. START doxycycline 100 mg twice daily.  11-17-2021 Rip Harbour, NP. Referral placed to spine surgery. START flexeril 5 mg 3 times daily PRN and tramadol 50 mg every 8 hours PRN.  10-19-2021  Rochel Brome, MD. flu vaccine given. Glucose= 103, Albumin/Globulin Ratio= 2.3. A1C= 5.9. START flonase 2 sprays into both nostrils daily. Finished meloxicam.  09-29-2021 Rip Harbour, NP. Abnormal UA and culture. START amoxicillin 875 mg twice daily.  09-18-2021 Rochel Brome, MD.  Positive covid test. START lagevrio 200 mg take 4 capsules twice daily for 5 days.  Recent consult visits:  02-08-2022 Loanne Drilling (Neurosurgery). Unable to view encounter.  01-26-2022 Eustace Moore (Neurosurgery). Unable to view encounter.  01-07-2022 Lynnell Dike (optometry). Unable to view encounter  Hospital visits:  None in previous 6 months  Medications: Outpatient Encounter Medications as of 03/17/2022  Medication Sig   acetaminophen (TYLENOL) 500 MG tablet Take 500 mg every  6 (six) hours as needed by mouth.   cholecalciferol (VITAMIN D3) 25 MCG (1000 UNIT) tablet Take 1,000 Units by mouth 2 (two) times daily.   denosumab (PROLIA) 60 MG/ML SOSY injection Inject 60 mg into the skin every 6 (six) months for 2 doses.   fluticasone (FLONASE) 50 MCG/ACT nasal spray Place 2 sprays into both nostrils daily.   LORazepam (ATIVAN) 0.5 MG tablet Take 1 tablet (0.5 mg total) by mouth every 8 (eight) hours as needed for anxiety.   Multiple Vitamins-Minerals (PRESERVISION AREDS 2 PO) Take by mouth.   omeprazole (PRILOSEC) 20 MG capsule TAKE 1 CAPSULE(20 MG) BY MOUTH DAILY   rosuvastatin (CRESTOR) 20 MG tablet TAKE 1 TABLET(20 MG) BY MOUTH AT BEDTIME   venlafaxine XR (EFFEXOR-XR) 150 MG 24 hr capsule TAKE 1 CAPSULE(150 MG) BY MOUTH DAILY WITH BREAKFAST   vitamin B-12 (CYANOCOBALAMIN) 1000 MCG tablet Take 1,000 mcg by mouth daily.   zolpidem (AMBIEN) 10 MG tablet TAKE 1 TABLET BY MOUTH AT BEDTIME   Facility-Administered Encounter Medications as of 03/17/2022  Medication   triamcinolone acetonide (KENALOG-40) injection 80 mg    Recent vitals BP Readings from Last 3 Encounters:  01/27/22 134/76  01/21/22 134/72  12/23/21 126/72   Pulse Readings from Last 3 Encounters:  01/27/22 78  01/21/22 92  12/23/21 98   Wt Readings from Last 3 Encounters:  01/27/22 161 lb 9.6 oz (73.3 kg)  01/21/22 163 lb (73.9 kg)  12/23/21 166 lb (75.3 kg)   BMI Readings from Last 3 Encounters:  01/27/22 26.08 kg/m  01/21/22 26.31 kg/m  12/23/21 26.79 kg/m    Recent lab results    Component Value Date/Time   NA 138  10/19/2021 1013   K 4.4 10/19/2021 1013   CL 102 10/19/2021 1013   CO2 25 10/19/2021 1013   GLUCOSE 103 (H) 10/19/2021 1013   BUN 8 10/19/2021 1013   CREATININE 0.60 10/19/2021 1013   CALCIUM 9.2 10/19/2021 1013    Lab Results  Component Value Date   CREATININE 0.60 10/19/2021   EGFR 95 10/19/2021   GFRNONAA 95 02/21/2020   GFRAA 109 02/21/2020   Lab Results   Component Value Date/Time   HGBA1C 5.9 (H) 10/19/2021 10:13 AM   HGBA1C 6.1 (H) 07/13/2021 09:56 AM   MICROALBUR 10 05/29/2020 08:44 AM    Lab Results  Component Value Date   CHOL 147 10/19/2021   HDL 61 10/19/2021   LDLCALC 61 10/19/2021   TRIG 147 10/19/2021   CHOLHDL 2.4 10/19/2021  03-17-2022: 1st attempt left VM 03-18-2022: 2nd attempt left VM 03-22-2022: 3rd attempt left VM  Care Gaps: Annual wellness visit in last year? Yes Hep c screening overdue 2nd shingrix overdue PNA Vac overdue  Star Rating Drugs: Rosuvastatin 20 mg- Last filled 02-10-2022 90 DS. Previous 11-12-2021 90 DS.  [facility] Pharmacist Assistant [phone]

## 2022-03-30 DIAGNOSIS — M461 Sacroiliitis, not elsewhere classified: Secondary | ICD-10-CM | POA: Diagnosis not present

## 2022-03-30 DIAGNOSIS — M5387 Other specified dorsopathies, lumbosacral region: Secondary | ICD-10-CM | POA: Diagnosis not present

## 2022-03-30 DIAGNOSIS — M7918 Myalgia, other site: Secondary | ICD-10-CM | POA: Diagnosis not present

## 2022-03-30 DIAGNOSIS — M9903 Segmental and somatic dysfunction of lumbar region: Secondary | ICD-10-CM | POA: Diagnosis not present

## 2022-03-30 DIAGNOSIS — M9905 Segmental and somatic dysfunction of pelvic region: Secondary | ICD-10-CM | POA: Diagnosis not present

## 2022-03-30 DIAGNOSIS — M9902 Segmental and somatic dysfunction of thoracic region: Secondary | ICD-10-CM | POA: Diagnosis not present

## 2022-04-02 DIAGNOSIS — M461 Sacroiliitis, not elsewhere classified: Secondary | ICD-10-CM | POA: Diagnosis not present

## 2022-04-02 DIAGNOSIS — M5387 Other specified dorsopathies, lumbosacral region: Secondary | ICD-10-CM | POA: Diagnosis not present

## 2022-04-02 DIAGNOSIS — M9903 Segmental and somatic dysfunction of lumbar region: Secondary | ICD-10-CM | POA: Diagnosis not present

## 2022-04-02 DIAGNOSIS — M7918 Myalgia, other site: Secondary | ICD-10-CM | POA: Diagnosis not present

## 2022-04-02 DIAGNOSIS — M9905 Segmental and somatic dysfunction of pelvic region: Secondary | ICD-10-CM | POA: Diagnosis not present

## 2022-04-02 DIAGNOSIS — M9902 Segmental and somatic dysfunction of thoracic region: Secondary | ICD-10-CM | POA: Diagnosis not present

## 2022-04-06 DIAGNOSIS — M5387 Other specified dorsopathies, lumbosacral region: Secondary | ICD-10-CM | POA: Diagnosis not present

## 2022-04-06 DIAGNOSIS — M9903 Segmental and somatic dysfunction of lumbar region: Secondary | ICD-10-CM | POA: Diagnosis not present

## 2022-04-06 DIAGNOSIS — M9902 Segmental and somatic dysfunction of thoracic region: Secondary | ICD-10-CM | POA: Diagnosis not present

## 2022-04-06 DIAGNOSIS — M461 Sacroiliitis, not elsewhere classified: Secondary | ICD-10-CM | POA: Diagnosis not present

## 2022-04-06 DIAGNOSIS — M9905 Segmental and somatic dysfunction of pelvic region: Secondary | ICD-10-CM | POA: Diagnosis not present

## 2022-04-06 DIAGNOSIS — M7918 Myalgia, other site: Secondary | ICD-10-CM | POA: Diagnosis not present

## 2022-04-09 DIAGNOSIS — M7918 Myalgia, other site: Secondary | ICD-10-CM | POA: Diagnosis not present

## 2022-04-09 DIAGNOSIS — M9905 Segmental and somatic dysfunction of pelvic region: Secondary | ICD-10-CM | POA: Diagnosis not present

## 2022-04-09 DIAGNOSIS — M9902 Segmental and somatic dysfunction of thoracic region: Secondary | ICD-10-CM | POA: Diagnosis not present

## 2022-04-09 DIAGNOSIS — M461 Sacroiliitis, not elsewhere classified: Secondary | ICD-10-CM | POA: Diagnosis not present

## 2022-04-09 DIAGNOSIS — M9903 Segmental and somatic dysfunction of lumbar region: Secondary | ICD-10-CM | POA: Diagnosis not present

## 2022-04-09 DIAGNOSIS — M5387 Other specified dorsopathies, lumbosacral region: Secondary | ICD-10-CM | POA: Diagnosis not present

## 2022-04-10 ENCOUNTER — Other Ambulatory Visit: Payer: Self-pay | Admitting: Family Medicine

## 2022-04-12 ENCOUNTER — Other Ambulatory Visit: Payer: Self-pay | Admitting: Family Medicine

## 2022-04-13 DIAGNOSIS — M9903 Segmental and somatic dysfunction of lumbar region: Secondary | ICD-10-CM | POA: Diagnosis not present

## 2022-04-13 DIAGNOSIS — M461 Sacroiliitis, not elsewhere classified: Secondary | ICD-10-CM | POA: Diagnosis not present

## 2022-04-13 DIAGNOSIS — M9905 Segmental and somatic dysfunction of pelvic region: Secondary | ICD-10-CM | POA: Diagnosis not present

## 2022-04-13 DIAGNOSIS — M9902 Segmental and somatic dysfunction of thoracic region: Secondary | ICD-10-CM | POA: Diagnosis not present

## 2022-04-13 DIAGNOSIS — M5387 Other specified dorsopathies, lumbosacral region: Secondary | ICD-10-CM | POA: Diagnosis not present

## 2022-04-13 DIAGNOSIS — M7918 Myalgia, other site: Secondary | ICD-10-CM | POA: Diagnosis not present

## 2022-04-15 DIAGNOSIS — H353112 Nonexudative age-related macular degeneration, right eye, intermediate dry stage: Secondary | ICD-10-CM | POA: Diagnosis not present

## 2022-04-15 DIAGNOSIS — H353221 Exudative age-related macular degeneration, left eye, with active choroidal neovascularization: Secondary | ICD-10-CM | POA: Diagnosis not present

## 2022-04-16 DIAGNOSIS — M9903 Segmental and somatic dysfunction of lumbar region: Secondary | ICD-10-CM | POA: Diagnosis not present

## 2022-04-16 DIAGNOSIS — M5387 Other specified dorsopathies, lumbosacral region: Secondary | ICD-10-CM | POA: Diagnosis not present

## 2022-04-16 DIAGNOSIS — M9902 Segmental and somatic dysfunction of thoracic region: Secondary | ICD-10-CM | POA: Diagnosis not present

## 2022-04-16 DIAGNOSIS — M461 Sacroiliitis, not elsewhere classified: Secondary | ICD-10-CM | POA: Diagnosis not present

## 2022-04-16 DIAGNOSIS — M9905 Segmental and somatic dysfunction of pelvic region: Secondary | ICD-10-CM | POA: Diagnosis not present

## 2022-04-16 DIAGNOSIS — M7918 Myalgia, other site: Secondary | ICD-10-CM | POA: Diagnosis not present

## 2022-04-20 DIAGNOSIS — M7918 Myalgia, other site: Secondary | ICD-10-CM | POA: Diagnosis not present

## 2022-04-20 DIAGNOSIS — M9905 Segmental and somatic dysfunction of pelvic region: Secondary | ICD-10-CM | POA: Diagnosis not present

## 2022-04-20 DIAGNOSIS — M461 Sacroiliitis, not elsewhere classified: Secondary | ICD-10-CM | POA: Diagnosis not present

## 2022-04-20 DIAGNOSIS — M9903 Segmental and somatic dysfunction of lumbar region: Secondary | ICD-10-CM | POA: Diagnosis not present

## 2022-04-20 DIAGNOSIS — M5387 Other specified dorsopathies, lumbosacral region: Secondary | ICD-10-CM | POA: Diagnosis not present

## 2022-04-20 DIAGNOSIS — M9902 Segmental and somatic dysfunction of thoracic region: Secondary | ICD-10-CM | POA: Diagnosis not present

## 2022-04-27 DIAGNOSIS — M9905 Segmental and somatic dysfunction of pelvic region: Secondary | ICD-10-CM | POA: Diagnosis not present

## 2022-04-27 DIAGNOSIS — M9902 Segmental and somatic dysfunction of thoracic region: Secondary | ICD-10-CM | POA: Diagnosis not present

## 2022-04-27 DIAGNOSIS — M461 Sacroiliitis, not elsewhere classified: Secondary | ICD-10-CM | POA: Diagnosis not present

## 2022-04-27 DIAGNOSIS — M9903 Segmental and somatic dysfunction of lumbar region: Secondary | ICD-10-CM | POA: Diagnosis not present

## 2022-04-27 DIAGNOSIS — M5387 Other specified dorsopathies, lumbosacral region: Secondary | ICD-10-CM | POA: Diagnosis not present

## 2022-04-27 DIAGNOSIS — M7918 Myalgia, other site: Secondary | ICD-10-CM | POA: Diagnosis not present

## 2022-04-29 DIAGNOSIS — M9905 Segmental and somatic dysfunction of pelvic region: Secondary | ICD-10-CM | POA: Diagnosis not present

## 2022-04-29 DIAGNOSIS — M461 Sacroiliitis, not elsewhere classified: Secondary | ICD-10-CM | POA: Diagnosis not present

## 2022-04-29 DIAGNOSIS — M9903 Segmental and somatic dysfunction of lumbar region: Secondary | ICD-10-CM | POA: Diagnosis not present

## 2022-04-29 DIAGNOSIS — M7918 Myalgia, other site: Secondary | ICD-10-CM | POA: Diagnosis not present

## 2022-04-29 DIAGNOSIS — M9902 Segmental and somatic dysfunction of thoracic region: Secondary | ICD-10-CM | POA: Diagnosis not present

## 2022-04-29 DIAGNOSIS — M5387 Other specified dorsopathies, lumbosacral region: Secondary | ICD-10-CM | POA: Diagnosis not present

## 2022-05-03 ENCOUNTER — Ambulatory Visit (INDEPENDENT_AMBULATORY_CARE_PROVIDER_SITE_OTHER): Payer: Medicare HMO | Admitting: Family Medicine

## 2022-05-03 VITALS — BP 132/80 | HR 77 | Temp 97.0°F | Ht 66.0 in | Wt 165.0 lb

## 2022-05-03 DIAGNOSIS — Z1159 Encounter for screening for other viral diseases: Secondary | ICD-10-CM

## 2022-05-03 DIAGNOSIS — F330 Major depressive disorder, recurrent, mild: Secondary | ICD-10-CM | POA: Diagnosis not present

## 2022-05-03 DIAGNOSIS — R7303 Prediabetes: Secondary | ICD-10-CM

## 2022-05-03 DIAGNOSIS — M810 Age-related osteoporosis without current pathological fracture: Secondary | ICD-10-CM | POA: Diagnosis not present

## 2022-05-03 DIAGNOSIS — E782 Mixed hyperlipidemia: Secondary | ICD-10-CM | POA: Diagnosis not present

## 2022-05-03 DIAGNOSIS — F411 Generalized anxiety disorder: Secondary | ICD-10-CM

## 2022-05-03 DIAGNOSIS — K219 Gastro-esophageal reflux disease without esophagitis: Secondary | ICD-10-CM | POA: Diagnosis not present

## 2022-05-03 MED ORDER — FAMOTIDINE 20 MG PO TABS
20.0000 mg | ORAL_TABLET | Freq: Two times a day (BID) | ORAL | 3 refills | Status: DC
Start: 2022-05-03 — End: 2023-04-19

## 2022-05-03 NOTE — Progress Notes (Signed)
Subjective:  Patient ID: Virginia Montoya, female    DOB: January 14, 1950  Age: 72 y.o. MRN: [id number]  Chief Complaint  Patient presents with   Prediabetes   Hyperlipidemia    HPI Osteoporosis. Prolia 60 mg every 6 months, Vitamin D 1000 U qd. Intolerant to calcium due to constipation.    GAD/Depression: on effexor xr 150 mg one daily in am. Lorazepam 0.5 mg 1 pill as needed during the day and patient takes 1 pill in evening. Takes ambien at night for sleep.   Prediabetes: Tries to eat healthy.  Walking for exercise.  Hyperlipidemia.   GERD. On omeprazole 20 mg daily. Well-controlled. Would like new rx for pepcid as well  Macular degeneration: sees eye doctor.   Hyperlipidemia: on crestor 20 mg before bed.  Eats fairly healthy.  Back pain/leg pain: seeing chiropractor. ABI normal. 3 ibuprofen twice daily.      05/03/2022    9:55 AM 01/27/2022    9:25 AM 10/19/2021    9:29 AM 07/13/2021    9:05 AM 05/07/2021    3:29 PM  Depression screen PHQ 2/9  Decreased Interest 0 0 0 0 0  Down, Depressed, Hopeless 0 1 0 0 0  PHQ - 2 Score 0 1 0 0 0  Altered sleeping 1 1 1  0   Tired, decreased energy 0 0 0 0   Change in appetite 0 0 0 0   Feeling bad or failure about yourself  0 0 0 0   Trouble concentrating 0 0 0 0   Moving slowly or fidgety/restless 0 0 0 0   Suicidal thoughts 0 0 0 0   PHQ-9 Score 1 2 1  0   Difficult doing work/chores Not difficult at all Not difficult at all Not difficult at all Not difficult at all          11/10/2020   11:27 AM 04/21/2021   10:29 AM 05/07/2021    3:32 PM 01/27/2022    9:24 AM 05/03/2022    9:54 AM  Fall Risk  Falls in the past year? 0 0 0 0 0  Was there an injury with Fall? 0 0 0 0 0  Fall Risk Category Calculator 0 0 0 0 0  Fall Risk Category (Retired) Low Low Low    (RETIRED) Patient Fall Risk Level Low fall risk Low fall risk Low fall risk    Patient at Risk for Falls Due to Impaired balance/gait  No Fall Risks History of fall(s) No Fall Risks   Fall risk Follow up Falls evaluation completed  Falls prevention discussed Falls evaluation completed Falls evaluation completed      Review of Systems  Constitutional:  Negative for chills, fatigue and fever.  HENT:  Negative for congestion, ear pain and sore throat.   Respiratory:  Negative for cough and shortness of breath.   Cardiovascular:  Negative for chest pain.  Gastrointestinal:  Negative for abdominal pain, constipation, diarrhea, nausea and vomiting.  Genitourinary:  Negative for dysuria and urgency.  Musculoskeletal:  Negative for arthralgias and myalgias.  Skin:  Negative for rash.  Neurological:  Negative for dizziness and headaches.  Psychiatric/Behavioral:  Negative for dysphoric mood. The patient is not nervous/anxious.     Current Outpatient Medications on File Prior to Visit  Medication Sig Dispense Refill   acetaminophen (TYLENOL) 500 MG tablet Take 500 mg every 6 (six) hours as needed by mouth.     cholecalciferol (VITAMIN D3) 25 MCG (1000 UNIT) tablet Take  1,000 Units by mouth 2 (two) times daily.     denosumab (PROLIA) 60 MG/ML SOSY injection Inject 60 mg into the skin every 6 (six) months for 2 doses. 1 mL 1   fluticasone (FLONASE) 50 MCG/ACT nasal spray Place 2 sprays into both nostrils daily. 16 g 6   LORazepam (ATIVAN) 0.5 MG tablet Take 1 tablet (0.5 mg total) by mouth every 8 (eight) hours as needed for anxiety. 90 tablet 1   Multiple Vitamins-Minerals (PRESERVISION AREDS 2 PO) Take by mouth.     omeprazole (PRILOSEC) 20 MG capsule TAKE 1 CAPSULE(20 MG) BY MOUTH DAILY 90 capsule 1   rosuvastatin (CRESTOR) 20 MG tablet TAKE 1 TABLET(20 MG) BY MOUTH AT BEDTIME 90 tablet 0   venlafaxine XR (EFFEXOR-XR) 150 MG 24 hr capsule TAKE 1 CAPSULE(150 MG) BY MOUTH DAILY WITH BREAKFAST 90 capsule 1   vitamin B-12 (CYANOCOBALAMIN) 1000 MCG tablet Take 1,000 mcg by mouth daily.     zolpidem (AMBIEN) 10 MG tablet TAKE 1 TABLET BY MOUTH AT BEDTIME 90 tablet 1   Current  Facility-Administered Medications on File Prior to Visit  Medication Dose Route Frequency Provider Last Rate Last Admin   triamcinolone acetonide (KENALOG-40) injection 80 mg  80 mg Intra-articular Once CoxFritzi Mandes, MD       Past Medical History:  Diagnosis Date   Chronic insomnia    Depression    Gastroesophageal reflux disease    Hyperlipidemia    Osteoporosis    Precordial chest pain 11/17/2016   Prediabetes    Past Surgical History:  Procedure Laterality Date   CHOLECYSTECTOMY  1999   CORONARY CALCIUM SCORE/CORONARY CTA  01/2017   Coronary calcium score is 0.  Normal coronary origin with normal right dominance.  No evidence of CAD.  Most likely noncardiac chest pain.   SHOULDER SURGERY  2005   TONSILLECTOMY     TUBAL LIGATION  1977    Family History  Problem Relation Age of Onset   Diabetes Mother    Hyperlipidemia Mother    Emphysema Father    Diabetes Maternal Grandmother    Liver cancer Maternal Grandmother    Social History   Socioeconomic History   Marital status: Married    Spouse name: Not on file   Number of children: 3   Years of education: 12   Highest education level: Associate degree: academic program  Occupational History   Occupation: Retired  Tobacco Use   Smoking status: Never   Smokeless tobacco: Never  Substance and Sexual Activity   Alcohol use: No   Drug use: No   Sexual activity: Not Currently  Other Topics Concern   Not on file  Social History Narrative   She is a married (remarried) mother of 3 with 4 grandchildren.  She has 3 great-grandchildren.  She previously worked at [employer] in the [employer].  She completed high school and beauty school.   She works outside a lot in the yard on gardening.  She does a lot of walking and exercises with walking for at least a half an hour a day 5 days a week.   wears sunscreen, brushes and flosses daily, see's dentist bi-annually, has smoke/carbon monoxide detectors, wears a seatbelt and  practices gun safety   Social Determinants of Health   Financial Resource Strain: Low Risk  (05/07/2021)   Overall Financial Resource Strain (CARDIA)    Difficulty of Paying Living Expenses: Not hard at all  Food Insecurity: No Food Insecurity (05/07/2021)  Hunger Vital Sign    Worried About Running Out of Food in the Last Year: Never true    Ran Out of Food in the Last Year: Never true  Transportation Needs: No Transportation Needs (05/07/2021)   PRAPARE - Administrator, Civil Service (Medical): No    Lack of Transportation (Non-Medical): No  Physical Activity: Insufficiently Active (05/07/2021)   Exercise Vital Sign    Days of Exercise per Week: 5 days    Minutes of Exercise per Session: 20 min  Stress: No Stress Concern Present (05/07/2021)   [employer] of Occupational Health - Occupational Stress Questionnaire    Feeling of Stress : Only a little  Social Connections: Moderately Integrated (05/07/2021)   Social Connection and Isolation Panel [NHANES]    Frequency of Communication with Friends and Family: More than three times a week    Frequency of Social Gatherings with Friends and Family: Three times a week    Attends Religious Services: More than 4 times per year    Active Member of Clubs or Organizations: No    Attends Banker Meetings: Never    Marital Status: Married    Objective:  BP 132/80   Pulse 77   Temp (!) 97 F (36.1 C)   Ht 5\' 6"  (1.676 m)   Wt 165 lb (74.8 kg)   SpO2 99%   BMI 26.63 kg/m      05/03/2022    9:50 AM 01/27/2022    9:17 AM 01/21/2022    1:44 PM  BP/Weight  Systolic BP 132 134 134  Diastolic BP 80 76 72  Wt. (Lbs) 165 161.6 163  BMI 26.63 kg/m2 26.08 kg/m2 26.31 kg/m2    Physical Exam Vitals reviewed.  Constitutional:      Appearance: Normal appearance. She is normal weight.  Neck:     Vascular: No carotid bruit.  Cardiovascular:     Rate and Rhythm: Normal rate and regular rhythm.     Heart sounds: Normal  heart sounds.  Pulmonary:     Effort: Pulmonary effort is normal. No respiratory distress.     Breath sounds: Normal breath sounds.  Abdominal:     General: Abdomen is flat. Bowel sounds are normal.     Palpations: Abdomen is soft.     Tenderness: There is no abdominal tenderness.  Neurological:     Mental Status: She is alert and oriented to person, place, and time.  Psychiatric:        Mood and Affect: Mood normal.        Behavior: Behavior normal.     Diabetic Foot Exam - Simple   No data filed      Lab Results  Component Value Date   WBC 6.1 05/03/2022   HGB 14.7 05/03/2022   HCT 44.4 05/03/2022   PLT 349 05/03/2022   GLUCOSE 113 (H) 05/03/2022   CHOL 147 10/19/2021   TRIG 147 10/19/2021   HDL 61 10/19/2021   LDLCALC 61 10/19/2021   ALT 17 05/03/2022   AST 17 05/03/2022   NA 139 05/03/2022   K 4.6 05/03/2022   CL 102 05/03/2022   CREATININE 0.58 05/03/2022   BUN 12 05/03/2022   CO2 22 05/03/2022   TSH 2.010 05/03/2022   HGBA1C 6.3 (H) 05/03/2022   MICROALBUR 10 05/29/2020      Assessment & Plan:    Prediabetes Assessment & Plan: Recommend continue to work on eating healthy diet and exercise.   Orders: -  Comprehensive metabolic panel -     Hemoglobin A1c  Mixed hyperlipidemia Assessment & Plan: Well controlled.  No changes to medicines. Continue crestor 20 mg before bed.  Continue to work on eating a healthy diet and exercise.  Labs drawn today.    Orders: -     CBC with Differential/Platelet -     TSH -     Interpretation:  Mild recurrent major depression (HCC) Assessment & Plan: The current medical regimen is effective;  continue present plan and medications. Continue on effexor xr 150 mg one daily in am. Lorazepam 0.5 mg 1 pill as needed during the day and patient takes 1 pill in evening. Takes ambien at night for sleep.       Need for hepatitis C screening test -     HCV Ab w Reflex to Quant PCR  Gastroesophageal reflux  disease without esophagitis Assessment & Plan: Continue omeprazole. Start pepcid.  Orders: -     Famotidine; Take 1 tablet (20 mg total) by mouth 2 (two) times daily.  Dispense: 180 tablet; Refill: 3  Age-related osteoporosis without current pathological fracture Assessment & Plan: Continue Prolia 60 mg every 6 months, Vitamin D 1000 U q   GAD (generalized anxiety disorder) Assessment & Plan: Continue on effexor xr 150 mg one daily in am. Lorazepam 0.5 mg 1 pill as needed during the day and patient takes 1 pill in evening. Takes ambien at night for sleep.        Meds ordered this encounter  Medications   famotidine (PEPCID) 20 MG tablet    Sig: Take 1 tablet (20 mg total) by mouth 2 (two) times daily.    Dispense:  180 tablet    Refill:  3    Orders Placed This Encounter  Procedures   Comprehensive metabolic panel   Hemoglobin A1c   CBC with Differential/Platelet   TSH   HCV Ab w Reflex to Quant PCR   Interpretation:     Follow-up: Return in about 3 months (around 08/02/2022) for chronic, fasting.   I,Marla I Leal-Borjas,acting as a scribe for Blane Ohara, MD.,have documented all relevant documentation on the behalf of Blane Ohara, MD,as directed by  Blane Ohara, MD while in the presence of Blane Ohara, MD.   An After Visit Summary was printed and given to the patient.  I attest that I have reviewed this visit and agree with the plan scribed by my staff.   Blane Ohara, MD [facility] [phone]

## 2022-05-03 NOTE — Assessment & Plan Note (Signed)
Well controlled.  No changes to medicines. Continue crestor 20 mg before bed.  Continue to work on eating a healthy diet and exercise.  Labs drawn today.   

## 2022-05-03 NOTE — Assessment & Plan Note (Signed)
>>  ASSESSMENT AND PLAN FOR MILD RECURRENT MAJOR DEPRESSION (HCC) WRITTEN ON 05/09/2022  9:15 AM BY Sariyah Corcino, MD  The current medical regimen is effective;  continue present plan and medications. Continue on effexor  xr 150 mg one daily in am. Lorazepam  0.5 mg 1 pill as needed during the day and patient takes 1 pill in evening. Takes ambien at night for sleep.

## 2022-05-03 NOTE — Assessment & Plan Note (Addendum)
The current medical regimen is effective;  continue present plan and medications. Continue on effexor xr 150 mg one daily in am. Lorazepam 0.5 mg 1 pill as needed during the day and patient takes 1 pill in evening. Takes ambien at night for sleep.

## 2022-05-03 NOTE — Assessment & Plan Note (Signed)
Recommend continue to work on eating healthy diet and exercise.  

## 2022-05-04 DIAGNOSIS — M7918 Myalgia, other site: Secondary | ICD-10-CM | POA: Diagnosis not present

## 2022-05-04 DIAGNOSIS — M5387 Other specified dorsopathies, lumbosacral region: Secondary | ICD-10-CM | POA: Diagnosis not present

## 2022-05-04 DIAGNOSIS — M9905 Segmental and somatic dysfunction of pelvic region: Secondary | ICD-10-CM | POA: Diagnosis not present

## 2022-05-04 DIAGNOSIS — M9903 Segmental and somatic dysfunction of lumbar region: Secondary | ICD-10-CM | POA: Diagnosis not present

## 2022-05-04 DIAGNOSIS — M461 Sacroiliitis, not elsewhere classified: Secondary | ICD-10-CM | POA: Diagnosis not present

## 2022-05-04 DIAGNOSIS — M9902 Segmental and somatic dysfunction of thoracic region: Secondary | ICD-10-CM | POA: Diagnosis not present

## 2022-05-04 LAB — HCV AB W REFLEX TO QUANT PCR: HCV Ab: NONREACTIVE

## 2022-05-04 LAB — HEMOGLOBIN A1C
Est. average glucose Bld gHb Est-mCnc: 134 mg/dL
Hgb A1c MFr Bld: 6.3 % — ABNORMAL HIGH (ref 4.8–5.6)

## 2022-05-04 LAB — CBC WITH DIFFERENTIAL/PLATELET
Basophils Absolute: 0 10*3/uL (ref 0.0–0.2)
Basos: 1 %
EOS (ABSOLUTE): 0.1 10*3/uL (ref 0.0–0.4)
Eos: 1 %
Hematocrit: 44.4 % (ref 34.0–46.6)
Hemoglobin: 14.7 g/dL (ref 11.1–15.9)
Immature Grans (Abs): 0 10*3/uL (ref 0.0–0.1)
Immature Granulocytes: 0 %
Lymphocytes Absolute: 1.9 10*3/uL (ref 0.7–3.1)
Lymphs: 32 %
MCH: 29.5 pg (ref 26.6–33.0)
MCHC: 33.1 g/dL (ref 31.5–35.7)
MCV: 89 fL (ref 79–97)
Monocytes Absolute: 0.5 10*3/uL (ref 0.1–0.9)
Monocytes: 7 %
Neutrophils Absolute: 3.6 10*3/uL (ref 1.4–7.0)
Neutrophils: 59 %
Platelets: 349 10*3/uL (ref 150–450)
RBC: 4.99 x10E6/uL (ref 3.77–5.28)
RDW: 13.1 % (ref 11.7–15.4)
WBC: 6.1 10*3/uL (ref 3.4–10.8)

## 2022-05-04 LAB — COMPREHENSIVE METABOLIC PANEL
ALT: 17 IU/L (ref 0–32)
AST: 17 IU/L (ref 0–40)
Albumin/Globulin Ratio: 2 (ref 1.2–2.2)
Albumin: 4.5 g/dL (ref 3.8–4.8)
Alkaline Phosphatase: 75 IU/L (ref 44–121)
BUN/Creatinine Ratio: 21 (ref 12–28)
BUN: 12 mg/dL (ref 8–27)
Bilirubin Total: 0.4 mg/dL (ref 0.0–1.2)
CO2: 22 mmol/L (ref 20–29)
Calcium: 9.6 mg/dL (ref 8.7–10.3)
Chloride: 102 mmol/L (ref 96–106)
Creatinine, Ser: 0.58 mg/dL (ref 0.57–1.00)
Globulin, Total: 2.3 g/dL (ref 1.5–4.5)
Glucose: 113 mg/dL — ABNORMAL HIGH (ref 70–99)
Potassium: 4.6 mmol/L (ref 3.5–5.2)
Sodium: 139 mmol/L (ref 134–144)
Total Protein: 6.8 g/dL (ref 6.0–8.5)
eGFR: 95 mL/min/{1.73_m2} (ref >59)

## 2022-05-04 LAB — HCV INTERPRETATION

## 2022-05-04 LAB — TSH: TSH: 2.01 u[IU]/mL (ref 0.450–4.500)

## 2022-05-07 DIAGNOSIS — M9903 Segmental and somatic dysfunction of lumbar region: Secondary | ICD-10-CM | POA: Diagnosis not present

## 2022-05-07 DIAGNOSIS — M9905 Segmental and somatic dysfunction of pelvic region: Secondary | ICD-10-CM | POA: Diagnosis not present

## 2022-05-07 DIAGNOSIS — M5387 Other specified dorsopathies, lumbosacral region: Secondary | ICD-10-CM | POA: Diagnosis not present

## 2022-05-07 DIAGNOSIS — M9902 Segmental and somatic dysfunction of thoracic region: Secondary | ICD-10-CM | POA: Diagnosis not present

## 2022-05-07 DIAGNOSIS — M461 Sacroiliitis, not elsewhere classified: Secondary | ICD-10-CM | POA: Diagnosis not present

## 2022-05-07 DIAGNOSIS — M7918 Myalgia, other site: Secondary | ICD-10-CM | POA: Diagnosis not present

## 2022-05-09 ENCOUNTER — Encounter: Payer: Self-pay | Admitting: Family Medicine

## 2022-05-09 DIAGNOSIS — Z1159 Encounter for screening for other viral diseases: Secondary | ICD-10-CM | POA: Insufficient documentation

## 2022-05-09 NOTE — Assessment & Plan Note (Signed)
Continue Prolia 60 mg every 6 months, Vitamin D 1000 U q

## 2022-05-09 NOTE — Assessment & Plan Note (Signed)
Continue omeprazole. Start pepcid.

## 2022-05-09 NOTE — Assessment & Plan Note (Signed)
Continue on effexor xr 150 mg one daily in am. Lorazepam 0.5 mg 1 pill as needed during the day and patient takes 1 pill in evening. Takes ambien at night for sleep.

## 2022-05-10 ENCOUNTER — Other Ambulatory Visit: Payer: Self-pay | Admitting: Family Medicine

## 2022-05-10 ENCOUNTER — Ambulatory Visit: Payer: PPO

## 2022-05-10 DIAGNOSIS — M810 Age-related osteoporosis without current pathological fracture: Secondary | ICD-10-CM

## 2022-05-10 DIAGNOSIS — Z1231 Encounter for screening mammogram for malignant neoplasm of breast: Secondary | ICD-10-CM

## 2022-05-10 DIAGNOSIS — Z0000 Encounter for general adult medical examination without abnormal findings: Secondary | ICD-10-CM

## 2022-05-10 NOTE — Progress Notes (Signed)
Subjective:   KAELY HARTMAN is a 72 y.o. female who presents for Medicare Annual (Subsequent) preventive examination.  I connected with  TILISHA TUFFY on 05/10/22 by a audio enabled telemedicine application and verified that I am speaking with the correct person using two identifiers.  Patient Location: Home  Provider Location: Office/Clinic  I discussed the limitations of evaluation and management by telemedicine. The patient expressed understanding and agreed to proceed.  Cardiac Risk Factors include: advanced age (>36men, >97 women)     Objective:    Today's Vitals   05/10/22 1421  PainSc: 7    There is no height or weight on file to calculate BMI.     05/10/2022    3:33 PM 05/07/2021    3:31 PM 12/03/2019   10:39 AM 04/30/2019    3:44 PM 03/06/2019   11:42 AM  Advanced Directives  Does Patient Have a Medical Advance Directive? Yes Yes Yes Yes Yes  Type of Estate agent of [employer] Power of [place];Living will Living will;Healthcare Power of [employer] Power of [place];Living will Healthcare Power of [place];Living will  Does patient want to make changes to medical advance directive? No - Patient declined No - Patient declined     Copy of Healthcare Power of Attorney in Chart? No - copy requested        Current Medications (verified) Outpatient Encounter Medications as of 05/10/2022  Medication Sig   acetaminophen (TYLENOL) 500 MG tablet Take 500 mg every 6 (six) hours as needed by mouth.   cholecalciferol (VITAMIN D3) 25 MCG (1000 UNIT) tablet Take 1,000 Units by mouth 2 (two) times daily.   denosumab (PROLIA) 60 MG/ML SOSY injection Inject 60 mg into the skin every 6 (six) months for 2 doses.   famotidine (PEPCID) 20 MG tablet Take 1 tablet (20 mg total) by mouth 2 (two) times daily.   fluticasone (FLONASE) 50 MCG/ACT nasal spray Place 2 sprays into both nostrils daily.   LORazepam (ATIVAN) 0.5 MG tablet Take 1 tablet (0.5 mg  total) by mouth every 8 (eight) hours as needed for anxiety.   Multiple Vitamins-Minerals (PRESERVISION AREDS 2 PO) Take by mouth.   omeprazole (PRILOSEC) 20 MG capsule TAKE 1 CAPSULE(20 MG) BY MOUTH DAILY   rosuvastatin (CRESTOR) 20 MG tablet TAKE 1 TABLET(20 MG) BY MOUTH AT BEDTIME   venlafaxine XR (EFFEXOR-XR) 150 MG 24 hr capsule TAKE 1 CAPSULE(150 MG) BY MOUTH DAILY WITH BREAKFAST   vitamin B-12 (CYANOCOBALAMIN) 1000 MCG tablet Take 1,000 mcg by mouth daily.   zolpidem (AMBIEN) 10 MG tablet TAKE 1 TABLET BY MOUTH AT BEDTIME   Facility-Administered Encounter Medications as of 05/10/2022  Medication   triamcinolone acetonide (KENALOG-40) injection 80 mg    Allergies (verified) Cephalexin, Alendronate, Biaxin [clarithromycin], Ciprofloxacin, Macrobid [nitrofurantoin], and Sulfa antibiotics   History: Past Medical History:  Diagnosis Date   Chronic insomnia    Depression    Gastroesophageal reflux disease    Hyperlipidemia    Osteoporosis    Precordial chest pain 11/17/2016   Prediabetes    Past Surgical History:  Procedure Laterality Date   CHOLECYSTECTOMY  1999   CORONARY CALCIUM SCORE/CORONARY CTA  01/2017   Coronary calcium score is 0.  Normal coronary origin with normal right dominance.  No evidence of CAD.  Most likely noncardiac chest pain.   SHOULDER SURGERY  2005   TONSILLECTOMY     TUBAL LIGATION  1977   Family History  Problem Relation Age of  Onset   Diabetes Mother    Hyperlipidemia Mother    Emphysema Father    Diabetes Maternal Grandmother    Liver cancer Maternal Grandmother    Social History   Socioeconomic History   Marital status: Married    Spouse name: Lyman Bishop   Number of children: 3   Years of education: 12   Highest education level: Associate degree: academic program  Occupational History   Occupation: Retired  Tobacco Use   Smoking status: Never   Smokeless tobacco: Never  Building services engineer Use: Never used  Substance and Sexual  Activity   Alcohol use: No   Drug use: No   Sexual activity: Not Currently  Other Topics Concern   Not on file  Social History Narrative   She is a married (remarried) mother of 3 with 4 grandchildren.  She has 3 great-grandchildren.  She previously worked at [employer] in the [employer].  She completed high school and beauty school.   She works outside a lot in the yard on gardening.  She does a lot of walking and exercises with walking for at least a half an hour a day 5 days a week.   wears sunscreen, brushes and flosses daily, see's dentist bi-annually, has smoke/carbon monoxide detectors, wears a seatbelt and practices gun safety   Social Determinants of Health   Financial Resource Strain: Low Risk  (05/07/2021)   Overall Financial Resource Strain (CARDIA)    Difficulty of Paying Living Expenses: Not hard at all  Food Insecurity: No Food Insecurity (05/10/2022)   Hunger Vital Sign    Worried About Running Out of Food in the Last Year: Never true    Ran Out of Food in the Last Year: Never true  Transportation Needs: No Transportation Needs (05/10/2022)   PRAPARE - Administrator, Civil Service (Medical): No    Lack of Transportation (Non-Medical): No  Physical Activity: Inactive (05/10/2022)   Exercise Vital Sign    Days of Exercise per Week: 0 days    Minutes of Exercise per Session: 0 min  Stress: No Stress Concern Present (05/10/2022)   [employer] of Occupational Health - Occupational Stress Questionnaire    Feeling of Stress : Only a little  Social Connections: Moderately Integrated (05/07/2021)   Social Connection and Isolation Panel [NHANES]    Frequency of Communication with Friends and Family: More than three times a week    Frequency of Social Gatherings with Friends and Family: Three times a week    Attends Religious Services: More than 4 times per year    Active Member of Clubs or Organizations: No    Attends Banker Meetings: Never    Marital  Status: Married    Tobacco Counseling Counseling given: Not Answered   Clinical Intake:  Pre-visit preparation completed: Yes  Pain : 0-10 Pain Score: 7  Pain Type: Chronic pain Pain Location: Back Pain Descriptors / Indicators: Aching Pain Frequency: Intermittent Pain Relieving Factors: sitting Effect of Pain on Daily Activities: moderate  Pain Relieving Factors: sitting  BMI - recorded: 26.64 Nutritional Status: BMI 25 -29 Overweight Nutritional Risks: None Diabetes: Yes CBG done?: No Did pt. bring in CBG monitor from home?: No  How often do you need to have someone help you when you read instructions, pamphlets, or other written materials from your doctor or pharmacy?: 2 - Rarely Interpreter Needed?: No    Activities of Daily Living    05/10/2022  2:24 PM  In your present state of health, do you have any difficulty performing the following activities:  Hearing? 1  Comment corrected with hearing aids  Vision? 0  Difficulty concentrating or making decisions? 0  Walking or climbing stairs? 0  Dressing or bathing? 0  Doing errands, shopping? 0  Preparing Food and eating ? N  Using the Toilet? N  In the past six months, have you accidently leaked urine? Y  Comment occasional  Do you have problems with loss of bowel control? N  Managing your Medications? N  Managing your Finances? N  Housekeeping or managing your Housekeeping? N    Patient Care Team: Blane Ohara, MD as PCP - General (Family Medicine)     Assessment:   This is a routine wellness examination for [place].  Hearing/Vision screen No results found.  Dietary issues and exercise activities discussed: Current Exercise Habits: Home exercise routine, Type of exercise: walking;stretching, Time (Minutes): 30, Frequency (Times/Week): 4, Weekly Exercise (Minutes/Week): 120, Intensity: Mild, Exercise limited by: orthopedic condition(s)   Depression Screen    05/10/2022    3:34 PM 05/03/2022    9:55 AM  01/27/2022    9:25 AM 10/19/2021    9:29 AM 07/13/2021    9:05 AM 05/07/2021    3:29 PM 04/21/2021   10:29 AM  PHQ 2/9 Scores  PHQ - 2 Score 0 0 1 0 0 0 0  PHQ- 9 Score 0 1 2 1  0      Fall Risk    05/10/2022    2:24 PM 05/03/2022    9:54 AM 01/27/2022    9:24 AM 05/07/2021    3:32 PM 04/21/2021   10:29 AM  Fall Risk   Falls in the past year? 0 0 0 0 0  Number falls in past yr: 0 0 0 0 0  Injury with Fall? 0 0 0 0 0  Risk for fall due to : No Fall Risks No Fall Risks History of fall(s) No Fall Risks   Follow up Falls evaluation completed;Education provided Falls evaluation completed Falls evaluation completed Falls prevention discussed     FALL RISK PREVENTION PERTAINING TO THE HOME:  Any stairs in or around the home? Yes  If so, are there any without handrails? No  Home free of loose throw rugs in walkways, pet beds, electrical cords, etc? Yes  Adequate lighting in your home to reduce risk of falls? Yes   ASSISTIVE DEVICES UTILIZED TO PREVENT FALLS:  Life alert? No  Use of a cane, walker or w/c? Yes  Grab bars in the bathroom? No  Shower chair or bench in shower? No  Elevated toilet seat or a handicapped toilet? No   Cognitive Function:        05/10/2022    3:35 PM 05/07/2021    3:33 PM 03/06/2019   12:30 PM  6CIT Screen  What Year? 0 points 0 points 0 points  What month? 0 points 0 points 0 points  What time? 0 points 0 points 0 points  Count back from 20 0 points 0 points 0 points  Months in reverse 0 points 0 points 0 points  Repeat phrase 0 points 0 points 0 points  Total Score 0 points 0 points 0 points    Immunizations Immunization History  Administered Date(s) Administered   COVID-19, mRNA, vaccine(Comirnaty)12 years and older 03/03/2022   Fluad Quad(high Dose 65+) 10/08/2019, 09/05/2020, 10/19/2021   Influenza, High Dose Seasonal PF 09/06/2014, 11/14/2015   Influenza,inj,Quad  PF,6+ Mos 10/02/2013, 10/12/2016, 10/21/2016   Influenza-Unspecified 11/02/2010,  11/01/2011, 09/19/2012, 10/11/2017   PFIZER(Purple Top)SARS-COV-2 Vaccination 05/01/2019, 05/24/2019, 12/06/2019   Pneumococcal Conjugate-13 10/22/2014   Pneumococcal Polysaccharide-23 11/14/2015, 10/12/2016   Respiratory Syncytial Virus Vaccine,Recomb Aduvanted(Arexvy) 12/07/2021   Td 10/23/2016   Zoster Recombinat (Shingrix) 11/02/2016, 01/03/2017   Zoster, Live 11/10/2013    TDAP status: Up to date  Flu Vaccine status: Up to date  Pneumococcal vaccine status: Up to date  Covid-19 vaccine status: Completed vaccines  Qualifies for Shingles Vaccine? Yes   Zostavax completed No   Shingrix Completed?: Yes  Screening Tests Health Maintenance  Topic Date Due   Medicare Annual Wellness (AWV)  07/01/2022   COVID-19 Vaccine (5 - 2023-24 season) 05/19/2022 (Originally 04/28/2022)   INFLUENZA VACCINE  08/05/2022   MAMMOGRAM  07/14/2023   Fecal DNA (Cologuard)  04/20/2024   DTaP/Tdap/Td (2 - Tdap) 10/24/2026   Pneumonia Vaccine 55+ Years old  Completed   DEXA SCAN  Completed   Hepatitis C Screening  Completed   Zoster Vaccines- Shingrix  Completed   HPV VACCINES  Aged Out    Health Maintenance  Health Maintenance Due  Topic Date Due   Medicare Annual Wellness (AWV)  07/01/2022    Colorectal cancer screening: Type of screening: Cologuard. Completed 04/2021. Repeat every 3 years  Mammogram status: Completed 07/13/21. Repeat every year  Bone Density status: Completed 02/26/19. Results reflect: Bone density results: OSTEOPOROSIS. Repeat every 2 years.  Lung Cancer Screening: (Low Dose CT Chest recommended if Age 25-80 years, 30 pack-year currently smoking OR have quit w/in 15years.) does not qualify.   Lung Cancer Screening Referral: N/A  Additional Screening:  Vision Screening: Recommended annual ophthalmology exams for early detection of glaucoma and other disorders of the eye. Is the patient up to date with their annual eye exam?  Yes   Dental Screening: Recommended  annual dental exams for proper oral hygiene  Community Resource Referral / Chronic Care Management: CRR required this visit?  No   CCM required this visit?  No      Plan:    1- Mammogram and DEXA ordered 2- Recommended exercise as tolerated  I have personally reviewed and noted the following in the patient's chart:   Medical and social history Use of alcohol, tobacco or illicit drugs  Current medications and supplements including opioid prescriptions. Patient is not currently taking opioid prescriptions. Functional ability and status Nutritional status Physical activity Advanced directives List of other physicians Hospitalizations, surgeries, and ER visits in previous 12 months Vitals Screenings to include cognitive, depression, and falls Referrals and appointments  In addition, I have reviewed and discussed with patient certain preventive protocols, quality metrics, and best practice recommendations. A written personalized care plan for preventive services as well as general preventive health recommendations were provided to patient.     Jacklynn Bue, LPN   01/09/1094

## 2022-05-10 NOTE — Patient Instructions (Signed)
Virginia Montoya , Thank you for taking time to come for your Medicare Wellness Visit. I appreciate your ongoing commitment to your health goals. Please review the following plan we discussed and let me know if I can assist you in the future.   Screening recommendations/referrals: Cologuard due 04/2024 Mammogram: Ordered Bone Density: Ordered Recommended yearly ophthalmology/optometry visit for glaucoma screening and checkup Recommended yearly dental visit for hygiene and checkup  Vaccinations: Influenza vaccine: Due in the fall Pneumococcal vaccine: Complete Tdap vaccine: Due 10/2026 Shingles vaccine: Complete    Advanced directives: Please bring a copy for your medical record    Preventive Care 65 Years and Older, Female   Preventive care refers to lifestyle choices and visits with your health care provider that can promote health and wellness.  What does preventive care include? A yearly physical exam. This is also called an annual well check. Dental exams once or twice a year. Routine eye exams. Ask your health care provider how often you should have your eyes checked. Personal lifestyle choices, including: Daily care of your teeth and gums. Regular physical activity. Eating a healthy diet. Avoiding tobacco and drug use. Limiting alcohol use. Practicing safe sex. Taking low-dose aspirin every day. Taking vitamin and mineral supplements as recommended by your health care provider.  What happens during an annual well check? The services and screenings done by your health care provider during your annual well check will depend on your age, overall health, lifestyle risk factors, and family history of disease.  Counseling Your health care provider may ask you questions about your: Alcohol use. Tobacco use. Drug use. Emotional well-being. Home and relationship well-being. Sexual activity. Eating habits. History of falls. Memory and ability to understand (cognition). Work  and work Astronomer. Reproductive health.  Screening You may have the following tests or measurements: Height, weight, and BMI. Blood pressure. Lipid and cholesterol levels. These may be checked every 5 years, or more frequently if you are over 74 years old. Skin check. Lung cancer screening. You may have this screening every year starting at age 74 if you have a 30-pack-year history of smoking and currently smoke or have quit within the past 15 years. Fecal occult blood test (FOBT) of the stool. You may have this test every year starting at age 74. Flexible sigmoidoscopy or colonoscopy. You may have a sigmoidoscopy every 5 years or a colonoscopy every 10 years starting at age 74. Hepatitis C blood test. Hepatitis B blood test. Sexually transmitted disease (STD) testing. Diabetes screening. This is done by checking your blood sugar (glucose) after you have not eaten for a while (fasting). You may have this done every 1-3 years. Bone density scan. This is done to screen for osteoporosis. You may have this done starting at age 53. Mammogram. This may be done every 1-2 years. Talk to your health care provider about how often you should have regular mammograms. Talk with your health care provider about your test results, treatment options, and if necessary, the need for more tests.  Vaccines Your health care provider may recommend certain vaccines, such as: Influenza vaccine. This is recommended every year. Tetanus, diphtheria, and acellular pertussis (Tdap, Td) vaccine. You may need a Td booster every 10 years. Zoster vaccine. You may need this after age 74. Pneumococcal 13-valent conjugate (PCV13) vaccine. One dose is recommended after age 30. Pneumococcal polysaccharide (PPSV23) vaccine. One dose is recommended after age 42. Talk to your health care provider about which screenings and vaccines you need and  how often you need them.  This information is not intended to replace advice given  to you by your health care provider. Make sure you discuss any questions you have with your health care provider. Document Released: 01/17/2015 Document Revised: 09/10/2015 Document Reviewed: 10/22/2014 Elsevier Interactive Patient Education  2017 [employer].   Fall Prevention in the Home  Falls can cause injuries. They can happen to people of all ages. There are many things you can do to make your home safe and to help prevent falls.  What can I do on the outside of my home? Regularly fix the edges of walkways and driveways and fix any cracks. Remove anything that might make you trip as you walk through a door, such as a raised step or threshold. Trim any bushes or trees on the path to your home. Use bright outdoor lighting. Clear any walking paths of anything that might make someone trip, such as rocks or tools. Regularly check to see if handrails are loose or broken. Make sure that both sides of any steps have handrails. Any raised decks and porches should have guardrails on the edges. Have any leaves, snow, or ice cleared regularly. Use sand or salt on walking paths during winter. Clean up any spills in your garage right away. This includes oil or grease spills.  What can I do in the bathroom? Use night lights. Install grab bars by the toilet and in the tub and shower. Do not use towel bars as grab bars. Use non-skid mats or decals in the tub or shower. If you need to sit down in the shower, use a plastic, non-slip stool. Keep the floor dry. Clean up any water that spills on the floor as soon as it happens. Remove soap buildup in the tub or shower regularly. Attach bath mats securely with double-sided non-slip rug tape. Do not have throw rugs and other things on the floor that can make you trip.  What can I do in the bedroom? Use night lights. Make sure that you have a light by your bed that is easy to reach. Do not use any sheets or blankets that are too big for your bed.  They should not hang down onto the floor. Have a firm chair that has side arms. You can use this for support while you get dressed. Do not have throw rugs and other things on the floor that can make you trip.  What can I do in the kitchen? Clean up any spills right away. Avoid walking on wet floors. Keep items that you use a lot in easy-to-reach places. If you need to reach something above you, use a strong step stool that has a grab bar. Keep electrical cords out of the way. Do not use floor polish or wax that makes floors slippery. If you must use wax, use non-skid floor wax. Do not have throw rugs and other things on the floor that can make you trip.  What can I do with my stairs? Do not leave any items on the stairs. Make sure that there are handrails on both sides of the stairs and use them. Fix handrails that are broken or loose. Make sure that handrails are as long as the stairways. Check any carpeting to make sure that it is firmly attached to the stairs. Fix any carpet that is loose or worn. Avoid having throw rugs at the top or bottom of the stairs. If you do have throw rugs, attach them to the floor  with carpet tape. Make sure that you have a light switch at the top of the stairs and the bottom of the stairs. If you do not have them, ask someone to add them for you.  What else can I do to help prevent falls? Wear shoes that: Do not have high heels. Have rubber bottoms. Are comfortable and fit you well. Are closed at the toe. Do not wear sandals. If you use a stepladder: Make sure that it is fully opened. Do not climb a closed stepladder. Make sure that both sides of the stepladder are locked into place. Ask someone to hold it for you, if possible. Clearly mark and make sure that you can see: Any grab bars or handrails. First and last steps. Where the edge of each step is. Use tools that help you move around (mobility aids) if they are needed. These  include: Canes. Walkers. Scooters. Crutches. Turn on the lights when you go into a dark area. Replace any light bulbs as soon as they burn out. Set up your furniture so you have a clear path. Avoid moving your furniture around. If any of your floors are uneven, fix them. If there are any pets around you, be aware of where they are. Review your medicines with your doctor. Some medicines can make you feel dizzy. This can increase your chance of falling. Ask your doctor what other things that you can do to help prevent falls.  This information is not intended to replace advice given to you by your health care provider. Make sure you discuss any questions you have with your health care provider. Document Released: 10/17/2008 Document Revised: 05/29/2015 Document Reviewed: 01/25/2014 Elsevier Interactive Patient Education  2017 [employer].

## 2022-05-11 DIAGNOSIS — M7918 Myalgia, other site: Secondary | ICD-10-CM | POA: Diagnosis not present

## 2022-05-11 DIAGNOSIS — M5387 Other specified dorsopathies, lumbosacral region: Secondary | ICD-10-CM | POA: Diagnosis not present

## 2022-05-11 DIAGNOSIS — M461 Sacroiliitis, not elsewhere classified: Secondary | ICD-10-CM | POA: Diagnosis not present

## 2022-05-11 DIAGNOSIS — M9902 Segmental and somatic dysfunction of thoracic region: Secondary | ICD-10-CM | POA: Diagnosis not present

## 2022-05-11 DIAGNOSIS — M9903 Segmental and somatic dysfunction of lumbar region: Secondary | ICD-10-CM | POA: Diagnosis not present

## 2022-05-11 DIAGNOSIS — M9905 Segmental and somatic dysfunction of pelvic region: Secondary | ICD-10-CM | POA: Diagnosis not present

## 2022-05-13 DIAGNOSIS — M9902 Segmental and somatic dysfunction of thoracic region: Secondary | ICD-10-CM | POA: Diagnosis not present

## 2022-05-13 DIAGNOSIS — M7918 Myalgia, other site: Secondary | ICD-10-CM | POA: Diagnosis not present

## 2022-05-13 DIAGNOSIS — M9903 Segmental and somatic dysfunction of lumbar region: Secondary | ICD-10-CM | POA: Diagnosis not present

## 2022-05-13 DIAGNOSIS — M5387 Other specified dorsopathies, lumbosacral region: Secondary | ICD-10-CM | POA: Diagnosis not present

## 2022-05-13 DIAGNOSIS — M461 Sacroiliitis, not elsewhere classified: Secondary | ICD-10-CM | POA: Diagnosis not present

## 2022-05-13 DIAGNOSIS — M9905 Segmental and somatic dysfunction of pelvic region: Secondary | ICD-10-CM | POA: Diagnosis not present

## 2022-05-16 ENCOUNTER — Other Ambulatory Visit: Payer: Self-pay | Admitting: Family Medicine

## 2022-05-16 DIAGNOSIS — J301 Allergic rhinitis due to pollen: Secondary | ICD-10-CM

## 2022-05-20 DIAGNOSIS — M7918 Myalgia, other site: Secondary | ICD-10-CM | POA: Diagnosis not present

## 2022-05-20 DIAGNOSIS — M461 Sacroiliitis, not elsewhere classified: Secondary | ICD-10-CM | POA: Diagnosis not present

## 2022-05-20 DIAGNOSIS — M5387 Other specified dorsopathies, lumbosacral region: Secondary | ICD-10-CM | POA: Diagnosis not present

## 2022-05-20 DIAGNOSIS — M9903 Segmental and somatic dysfunction of lumbar region: Secondary | ICD-10-CM | POA: Diagnosis not present

## 2022-05-20 DIAGNOSIS — M9902 Segmental and somatic dysfunction of thoracic region: Secondary | ICD-10-CM | POA: Diagnosis not present

## 2022-05-20 DIAGNOSIS — M9905 Segmental and somatic dysfunction of pelvic region: Secondary | ICD-10-CM | POA: Diagnosis not present

## 2022-05-24 ENCOUNTER — Telehealth: Payer: Self-pay

## 2022-05-24 NOTE — Telephone Encounter (Signed)
   Virginia Montoya has been scheduled for the following appointment:  WHAT: Screening Mammogram WHERE: [facility] DATE: 07/15/22 TIME: 10:30  Patient has been made aware.

## 2022-05-25 DIAGNOSIS — M7918 Myalgia, other site: Secondary | ICD-10-CM | POA: Diagnosis not present

## 2022-05-25 DIAGNOSIS — M5387 Other specified dorsopathies, lumbosacral region: Secondary | ICD-10-CM | POA: Diagnosis not present

## 2022-05-25 DIAGNOSIS — M9905 Segmental and somatic dysfunction of pelvic region: Secondary | ICD-10-CM | POA: Diagnosis not present

## 2022-05-25 DIAGNOSIS — M461 Sacroiliitis, not elsewhere classified: Secondary | ICD-10-CM | POA: Diagnosis not present

## 2022-05-25 DIAGNOSIS — M9903 Segmental and somatic dysfunction of lumbar region: Secondary | ICD-10-CM | POA: Diagnosis not present

## 2022-05-25 DIAGNOSIS — M9902 Segmental and somatic dysfunction of thoracic region: Secondary | ICD-10-CM | POA: Diagnosis not present

## 2022-05-27 DIAGNOSIS — H353221 Exudative age-related macular degeneration, left eye, with active choroidal neovascularization: Secondary | ICD-10-CM | POA: Diagnosis not present

## 2022-05-28 DIAGNOSIS — M9903 Segmental and somatic dysfunction of lumbar region: Secondary | ICD-10-CM | POA: Diagnosis not present

## 2022-05-28 DIAGNOSIS — M5387 Other specified dorsopathies, lumbosacral region: Secondary | ICD-10-CM | POA: Diagnosis not present

## 2022-05-28 DIAGNOSIS — M9905 Segmental and somatic dysfunction of pelvic region: Secondary | ICD-10-CM | POA: Diagnosis not present

## 2022-05-28 DIAGNOSIS — M461 Sacroiliitis, not elsewhere classified: Secondary | ICD-10-CM | POA: Diagnosis not present

## 2022-05-28 DIAGNOSIS — M7918 Myalgia, other site: Secondary | ICD-10-CM | POA: Diagnosis not present

## 2022-05-28 DIAGNOSIS — M9902 Segmental and somatic dysfunction of thoracic region: Secondary | ICD-10-CM | POA: Diagnosis not present

## 2022-06-01 ENCOUNTER — Other Ambulatory Visit: Payer: Self-pay

## 2022-06-01 DIAGNOSIS — F411 Generalized anxiety disorder: Secondary | ICD-10-CM

## 2022-06-01 MED ORDER — VENLAFAXINE HCL ER 150 MG PO CP24
ORAL_CAPSULE | ORAL | 1 refills | Status: DC
Start: 2022-06-01 — End: 2022-11-30

## 2022-06-02 ENCOUNTER — Other Ambulatory Visit: Payer: Self-pay | Admitting: Family Medicine

## 2022-06-02 DIAGNOSIS — M7918 Myalgia, other site: Secondary | ICD-10-CM | POA: Diagnosis not present

## 2022-06-02 DIAGNOSIS — M461 Sacroiliitis, not elsewhere classified: Secondary | ICD-10-CM | POA: Diagnosis not present

## 2022-06-02 DIAGNOSIS — M9902 Segmental and somatic dysfunction of thoracic region: Secondary | ICD-10-CM | POA: Diagnosis not present

## 2022-06-02 DIAGNOSIS — M5387 Other specified dorsopathies, lumbosacral region: Secondary | ICD-10-CM | POA: Diagnosis not present

## 2022-06-02 DIAGNOSIS — M9905 Segmental and somatic dysfunction of pelvic region: Secondary | ICD-10-CM | POA: Diagnosis not present

## 2022-06-02 DIAGNOSIS — F411 Generalized anxiety disorder: Secondary | ICD-10-CM

## 2022-06-02 DIAGNOSIS — M9903 Segmental and somatic dysfunction of lumbar region: Secondary | ICD-10-CM | POA: Diagnosis not present

## 2022-06-07 ENCOUNTER — Telehealth: Payer: Self-pay

## 2022-06-07 NOTE — Telephone Encounter (Signed)
Patient called stating that she wanted to ask you a question, she states that she knows that you have tried her on different medications for her back pain, and she states that she knows Lyman Bishop her husband is on Tizanidine to help with his pain and states she would like to be tried on it to see if she can benefit from it too as well. Please advise.

## 2022-06-08 ENCOUNTER — Other Ambulatory Visit: Payer: Self-pay | Admitting: Family Medicine

## 2022-06-08 MED ORDER — TIZANIDINE HCL 2 MG PO TABS: 2.0000 mg | ORAL_TABLET | Freq: Three times a day (TID) | ORAL | 1 refills | Status: DC

## 2022-06-08 NOTE — Telephone Encounter (Signed)
Patient notified

## 2022-06-09 DIAGNOSIS — M5387 Other specified dorsopathies, lumbosacral region: Secondary | ICD-10-CM | POA: Diagnosis not present

## 2022-06-09 DIAGNOSIS — M7918 Myalgia, other site: Secondary | ICD-10-CM | POA: Diagnosis not present

## 2022-06-09 DIAGNOSIS — M9905 Segmental and somatic dysfunction of pelvic region: Secondary | ICD-10-CM | POA: Diagnosis not present

## 2022-06-09 DIAGNOSIS — M461 Sacroiliitis, not elsewhere classified: Secondary | ICD-10-CM | POA: Diagnosis not present

## 2022-06-09 DIAGNOSIS — M9903 Segmental and somatic dysfunction of lumbar region: Secondary | ICD-10-CM | POA: Diagnosis not present

## 2022-06-09 DIAGNOSIS — M9902 Segmental and somatic dysfunction of thoracic region: Secondary | ICD-10-CM | POA: Diagnosis not present

## 2022-06-16 DIAGNOSIS — M9905 Segmental and somatic dysfunction of pelvic region: Secondary | ICD-10-CM | POA: Diagnosis not present

## 2022-06-16 DIAGNOSIS — M9903 Segmental and somatic dysfunction of lumbar region: Secondary | ICD-10-CM | POA: Diagnosis not present

## 2022-06-16 DIAGNOSIS — M5387 Other specified dorsopathies, lumbosacral region: Secondary | ICD-10-CM | POA: Diagnosis not present

## 2022-06-16 DIAGNOSIS — M461 Sacroiliitis, not elsewhere classified: Secondary | ICD-10-CM | POA: Diagnosis not present

## 2022-06-16 DIAGNOSIS — M7918 Myalgia, other site: Secondary | ICD-10-CM | POA: Diagnosis not present

## 2022-06-16 DIAGNOSIS — M9902 Segmental and somatic dysfunction of thoracic region: Secondary | ICD-10-CM | POA: Diagnosis not present

## 2022-06-22 DIAGNOSIS — M9903 Segmental and somatic dysfunction of lumbar region: Secondary | ICD-10-CM | POA: Diagnosis not present

## 2022-06-22 DIAGNOSIS — M9902 Segmental and somatic dysfunction of thoracic region: Secondary | ICD-10-CM | POA: Diagnosis not present

## 2022-06-22 DIAGNOSIS — M461 Sacroiliitis, not elsewhere classified: Secondary | ICD-10-CM | POA: Diagnosis not present

## 2022-06-22 DIAGNOSIS — M7918 Myalgia, other site: Secondary | ICD-10-CM | POA: Diagnosis not present

## 2022-06-22 DIAGNOSIS — M5387 Other specified dorsopathies, lumbosacral region: Secondary | ICD-10-CM | POA: Diagnosis not present

## 2022-06-22 DIAGNOSIS — M9905 Segmental and somatic dysfunction of pelvic region: Secondary | ICD-10-CM | POA: Diagnosis not present

## 2022-06-24 ENCOUNTER — Other Ambulatory Visit: Payer: Self-pay | Admitting: Family Medicine

## 2022-06-29 ENCOUNTER — Telehealth: Payer: Self-pay | Admitting: Family Medicine

## 2022-06-29 DIAGNOSIS — J019 Acute sinusitis, unspecified: Secondary | ICD-10-CM | POA: Diagnosis not present

## 2022-06-29 DIAGNOSIS — R051 Acute cough: Secondary | ICD-10-CM | POA: Diagnosis not present

## 2022-06-29 NOTE — Telephone Encounter (Signed)
Pt called today to request a same day appointment for the following symptoms:possible sinus infection.   Unfortunately, our schedule is full and we have no openings between today or tomorrow. Pt was notified that they can wait on a call from our triage or they can do an e-visit through MyChart with a  Provider from home if they have the ability .

## 2022-07-13 DIAGNOSIS — M5387 Other specified dorsopathies, lumbosacral region: Secondary | ICD-10-CM | POA: Diagnosis not present

## 2022-07-13 DIAGNOSIS — M7918 Myalgia, other site: Secondary | ICD-10-CM | POA: Diagnosis not present

## 2022-07-13 DIAGNOSIS — M9902 Segmental and somatic dysfunction of thoracic region: Secondary | ICD-10-CM | POA: Diagnosis not present

## 2022-07-13 DIAGNOSIS — M9903 Segmental and somatic dysfunction of lumbar region: Secondary | ICD-10-CM | POA: Diagnosis not present

## 2022-07-13 DIAGNOSIS — M461 Sacroiliitis, not elsewhere classified: Secondary | ICD-10-CM | POA: Diagnosis not present

## 2022-07-13 DIAGNOSIS — M9905 Segmental and somatic dysfunction of pelvic region: Secondary | ICD-10-CM | POA: Diagnosis not present

## 2022-07-14 ENCOUNTER — Other Ambulatory Visit: Payer: Self-pay | Admitting: Family Medicine

## 2022-07-15 DIAGNOSIS — Z1231 Encounter for screening mammogram for malignant neoplasm of breast: Secondary | ICD-10-CM | POA: Diagnosis not present

## 2022-07-15 LAB — HM MAMMOGRAPHY

## 2022-07-16 ENCOUNTER — Encounter: Payer: Self-pay | Admitting: Family Medicine

## 2022-07-20 ENCOUNTER — Other Ambulatory Visit: Payer: Self-pay

## 2022-07-20 DIAGNOSIS — M9905 Segmental and somatic dysfunction of pelvic region: Secondary | ICD-10-CM | POA: Diagnosis not present

## 2022-07-20 DIAGNOSIS — M461 Sacroiliitis, not elsewhere classified: Secondary | ICD-10-CM | POA: Diagnosis not present

## 2022-07-20 DIAGNOSIS — M7918 Myalgia, other site: Secondary | ICD-10-CM | POA: Diagnosis not present

## 2022-07-20 DIAGNOSIS — M9903 Segmental and somatic dysfunction of lumbar region: Secondary | ICD-10-CM | POA: Diagnosis not present

## 2022-07-20 DIAGNOSIS — M9902 Segmental and somatic dysfunction of thoracic region: Secondary | ICD-10-CM | POA: Diagnosis not present

## 2022-07-20 DIAGNOSIS — N6310 Unspecified lump in the right breast, unspecified quadrant: Secondary | ICD-10-CM

## 2022-07-20 DIAGNOSIS — M5387 Other specified dorsopathies, lumbosacral region: Secondary | ICD-10-CM | POA: Diagnosis not present

## 2022-07-22 DIAGNOSIS — H353221 Exudative age-related macular degeneration, left eye, with active choroidal neovascularization: Secondary | ICD-10-CM | POA: Diagnosis not present

## 2022-07-27 ENCOUNTER — Telehealth: Payer: Self-pay | Admitting: Family Medicine

## 2022-07-27 DIAGNOSIS — M7918 Myalgia, other site: Secondary | ICD-10-CM | POA: Diagnosis not present

## 2022-07-27 DIAGNOSIS — M9903 Segmental and somatic dysfunction of lumbar region: Secondary | ICD-10-CM | POA: Diagnosis not present

## 2022-07-27 DIAGNOSIS — M9902 Segmental and somatic dysfunction of thoracic region: Secondary | ICD-10-CM | POA: Diagnosis not present

## 2022-07-27 DIAGNOSIS — M5387 Other specified dorsopathies, lumbosacral region: Secondary | ICD-10-CM | POA: Diagnosis not present

## 2022-07-27 DIAGNOSIS — M9905 Segmental and somatic dysfunction of pelvic region: Secondary | ICD-10-CM | POA: Diagnosis not present

## 2022-07-27 DIAGNOSIS — M461 Sacroiliitis, not elsewhere classified: Secondary | ICD-10-CM | POA: Diagnosis not present

## 2022-07-27 NOTE — Telephone Encounter (Signed)
   [name] has been scheduled for the following appointment:  WHAT: DIAGNOSTIC MAMMOGRAM & ULTRASOUND WHERE: [facility] DATE: 08/12/22 TIME: 1:50 PM CHECK-IN  A message has been left for the patient.

## 2022-07-29 ENCOUNTER — Other Ambulatory Visit: Payer: Self-pay

## 2022-07-29 DIAGNOSIS — N6310 Unspecified lump in the right breast, unspecified quadrant: Secondary | ICD-10-CM

## 2022-08-02 ENCOUNTER — Other Ambulatory Visit: Payer: Self-pay | Admitting: Family Medicine

## 2022-08-03 DIAGNOSIS — M461 Sacroiliitis, not elsewhere classified: Secondary | ICD-10-CM | POA: Diagnosis not present

## 2022-08-03 DIAGNOSIS — M5387 Other specified dorsopathies, lumbosacral region: Secondary | ICD-10-CM | POA: Diagnosis not present

## 2022-08-03 DIAGNOSIS — M9903 Segmental and somatic dysfunction of lumbar region: Secondary | ICD-10-CM | POA: Diagnosis not present

## 2022-08-03 DIAGNOSIS — M9905 Segmental and somatic dysfunction of pelvic region: Secondary | ICD-10-CM | POA: Diagnosis not present

## 2022-08-03 DIAGNOSIS — M9902 Segmental and somatic dysfunction of thoracic region: Secondary | ICD-10-CM | POA: Diagnosis not present

## 2022-08-03 DIAGNOSIS — M7918 Myalgia, other site: Secondary | ICD-10-CM | POA: Diagnosis not present

## 2022-08-06 ENCOUNTER — Other Ambulatory Visit: Payer: Self-pay

## 2022-08-06 DIAGNOSIS — M810 Age-related osteoporosis without current pathological fracture: Secondary | ICD-10-CM

## 2022-08-06 MED ORDER — DENOSUMAB 60 MG/ML ~~LOC~~ SOSY
60.0000 mg | PREFILLED_SYRINGE | SUBCUTANEOUS | 0 refills | Status: AC
Start: 2022-08-06 — End: 2022-08-07

## 2022-08-06 NOTE — Progress Notes (Unsigned)
[facility] sent to [facility].  Patient is due 08/19/22

## 2022-08-10 DIAGNOSIS — M461 Sacroiliitis, not elsewhere classified: Secondary | ICD-10-CM | POA: Diagnosis not present

## 2022-08-10 DIAGNOSIS — M9903 Segmental and somatic dysfunction of lumbar region: Secondary | ICD-10-CM | POA: Diagnosis not present

## 2022-08-10 DIAGNOSIS — M9902 Segmental and somatic dysfunction of thoracic region: Secondary | ICD-10-CM | POA: Diagnosis not present

## 2022-08-10 DIAGNOSIS — M9905 Segmental and somatic dysfunction of pelvic region: Secondary | ICD-10-CM | POA: Diagnosis not present

## 2022-08-10 DIAGNOSIS — M7918 Myalgia, other site: Secondary | ICD-10-CM | POA: Diagnosis not present

## 2022-08-10 DIAGNOSIS — M5387 Other specified dorsopathies, lumbosacral region: Secondary | ICD-10-CM | POA: Diagnosis not present

## 2022-08-17 DIAGNOSIS — M7918 Myalgia, other site: Secondary | ICD-10-CM | POA: Diagnosis not present

## 2022-08-17 DIAGNOSIS — M5387 Other specified dorsopathies, lumbosacral region: Secondary | ICD-10-CM | POA: Diagnosis not present

## 2022-08-17 DIAGNOSIS — M9903 Segmental and somatic dysfunction of lumbar region: Secondary | ICD-10-CM | POA: Diagnosis not present

## 2022-08-17 DIAGNOSIS — M9902 Segmental and somatic dysfunction of thoracic region: Secondary | ICD-10-CM | POA: Diagnosis not present

## 2022-08-17 DIAGNOSIS — M461 Sacroiliitis, not elsewhere classified: Secondary | ICD-10-CM | POA: Diagnosis not present

## 2022-08-17 DIAGNOSIS — M9905 Segmental and somatic dysfunction of pelvic region: Secondary | ICD-10-CM | POA: Diagnosis not present

## 2022-08-22 NOTE — Assessment & Plan Note (Addendum)
Continue omeprazole and pepcid.

## 2022-08-22 NOTE — Assessment & Plan Note (Signed)
Well controlled.  No changes to medicines. Continue crestor 20 mg before bed.  Continue to work on eating a healthy diet and exercise.  Labs drawn today.   

## 2022-08-22 NOTE — Assessment & Plan Note (Signed)
Continue on effexor xr 150 mg one daily in am. Lorazepam 0.5 mg 1 pill as needed during the day and patient takes 1 pill in evening. Takes ambien at night for sleep.

## 2022-08-22 NOTE — Assessment & Plan Note (Signed)
Continue Prolia 60 mg every 6 months, Vitamin D 1000 U q

## 2022-08-22 NOTE — Assessment & Plan Note (Signed)
Recommend continue to work on eating healthy diet and exercise.  

## 2022-08-22 NOTE — Assessment & Plan Note (Signed)
The current medical regimen is effective;  continue present plan and medications.  

## 2022-08-22 NOTE — Progress Notes (Signed)
Subjective:  Patient ID: Virginia Montoya, female    DOB: 05/11/1950  Age: 72 y.o. MRN: [id number]  Chief Complaint  Patient presents with   Medical Management of Chronic Issues    HPI Osteoporosis. Prolia 60 mg every 6 months, Vitamin D 1000 U qd. Intolerant to calcium due to constipation.    GAD/Depression: on effexor xr 150 mg one daily in am. Lorazepam 0.5 mg 1 pill as needed during the day usually around 2 pm.  Patient takes Lorazepam 1/2 pill in evening. Takes ambien at night for sleep.   Prediabetes: Tries to eat healthy.  Walking for exercise.  Hyperlipidemia.   GERD. On omeprazole 20 mg daily. Well-controlled. \  Macular degeneration: sees eye doctor.   Hyperlipidemia: on crestor 20 mg before bed.  Eats fairly healthy and exercising.      08/23/2022    9:49 AM 05/10/2022    3:34 PM 05/03/2022    9:55 AM 01/27/2022    9:25 AM 10/19/2021    9:29 AM  Depression screen PHQ 2/9  Decreased Interest 0 0 0 0 0  Down, Depressed, Hopeless 0 0 0 1 0  PHQ - 2 Score 0 0 0 1 0  Altered sleeping 2 0 1 1 1   Tired, decreased energy 0 0 0 0 0  Change in appetite 2 0 0 0 0  Feeling bad or failure about yourself  0 0 0 0 0  Trouble concentrating 0 0 0 0 0  Moving slowly or fidgety/restless 0 0 0 0 0  Suicidal thoughts 0 0 0 0 0  PHQ-9 Score 4 0 1 2 1   Difficult doing work/chores Not difficult at all Not difficult at all Not difficult at all Not difficult at all Not difficult at all        08/23/2022    9:49 AM  Fall Risk   Falls in the past year? 0  Number falls in past yr: 0  Injury with Fall? 0  Risk for fall due to : No Fall Risks  Follow up Falls evaluation completed    Patient Care Team: Blane Ohara, MD as PCP - General (Family Medicine)   Review of Systems  Constitutional:  Negative for chills, fatigue and fever.  HENT:  Negative for congestion, ear pain, rhinorrhea and sore throat.   Respiratory:  Negative for cough and shortness of breath.   Cardiovascular:   Negative for chest pain.  Gastrointestinal:  Negative for abdominal pain, constipation, diarrhea, nausea and vomiting.  Genitourinary:  Negative for dysuria and urgency.  Musculoskeletal:  Positive for arthralgias and back pain. Negative for myalgias.  Neurological:  Negative for dizziness, weakness, light-headedness and headaches.  Psychiatric/Behavioral:  Negative for dysphoric mood. The patient is not nervous/anxious.     Current Outpatient Medications on File Prior to Visit  Medication Sig Dispense Refill   acetaminophen (TYLENOL) 500 MG tablet Take 500 mg every 6 (six) hours as needed by mouth.     cholecalciferol (VITAMIN D3) 25 MCG (1000 UNIT) tablet Take 1,000 Units by mouth 2 (two) times daily.     famotidine (PEPCID) 20 MG tablet Take 1 tablet (20 mg total) by mouth 2 (two) times daily. 180 tablet 3   fluticasone (FLONASE) 50 MCG/ACT nasal spray SHAKE LIQUID AND USE 2 SPRAYS IN EACH NOSTRIL DAILY 16 g 6   LORazepam (ATIVAN) 0.5 MG tablet TAKE 1 TABLET(0.5 MG) BY MOUTH EVERY 8 HOURS AS NEEDED FOR ANXIETY 90 tablet 2   Multiple Vitamins-Minerals (  PRESERVISION AREDS 2 PO) Take by mouth.     omeprazole (PRILOSEC) 20 MG capsule TAKE 1 CAPSULE(20 MG) BY MOUTH DAILY 90 capsule 1   rosuvastatin (CRESTOR) 20 MG tablet TAKE 1 TABLET(20 MG) BY MOUTH AT BEDTIME 90 tablet 1   venlafaxine XR (EFFEXOR-XR) 150 MG 24 hr capsule Take one tablet by mouth daily. 90 capsule 1   vitamin B-12 (CYANOCOBALAMIN) 1000 MCG tablet Take 1,000 mcg by mouth daily.     zolpidem (AMBIEN) 10 MG tablet TAKE 1 TABLET BY MOUTH AT BEDTIME 90 tablet 1   No current facility-administered medications on file prior to visit.   Past Medical History:  Diagnosis Date   Chronic insomnia    Depression    Gastroesophageal reflux disease    Hyperlipidemia    Osteoporosis    Precordial chest pain 11/17/2016   Prediabetes    Past Surgical History:  Procedure Laterality Date   CHOLECYSTECTOMY  1999   CORONARY CALCIUM  SCORE/CORONARY CTA  01/2017   Coronary calcium score is 0.  Normal coronary origin with normal right dominance.  No evidence of CAD.  Most likely noncardiac chest pain.   SHOULDER SURGERY  2005   TONSILLECTOMY     TUBAL LIGATION  1977    Family History  Problem Relation Age of Onset   Diabetes Mother    Hyperlipidemia Mother    Emphysema Father    Diabetes Maternal Grandmother    Liver cancer Maternal Grandmother    Social History   Socioeconomic History   Marital status: Married    Spouse name: Lyman Bishop   Number of children: 3   Years of education: 12   Highest education level: Associate degree: academic program  Occupational History   Occupation: Retired  Tobacco Use   Smoking status: Never   Smokeless tobacco: Never  Vaping Use   Vaping status: Never Used  Substance and Sexual Activity   Alcohol use: No   Drug use: No   Sexual activity: Not Currently  Other Topics Concern   Not on file  Social History Narrative   She is a married (remarried) mother of 3 with 4 grandchildren.  She has 3 great-grandchildren.  She previously worked at [employer] in the [employer].  She completed high school and beauty school.   She works outside a lot in the yard on gardening.  She does a lot of walking and exercises with walking for at least a half an hour a day 5 days a week.   wears sunscreen, brushes and flosses daily, see's dentist bi-annually, has smoke/carbon monoxide detectors, wears a seatbelt and practices gun safety   Social Determinants of Health   Financial Resource Strain: Low Risk  (05/07/2021)   Overall Financial Resource Strain (CARDIA)    Difficulty of Paying Living Expenses: Not hard at all  Food Insecurity: No Food Insecurity (05/10/2022)   Hunger Vital Sign    Worried About Running Out of Food in the Last Year: Never true    Ran Out of Food in the Last Year: Never true  Transportation Needs: No Transportation Needs (05/10/2022)   PRAPARE - Scientist, research (physical sciences) (Medical): No    Lack of Transportation (Non-Medical): No  Physical Activity: Inactive (08/23/2022)   Exercise Vital Sign    Days of Exercise per Week: 0 days    Minutes of Exercise per Session: 0 min  Stress: No Stress Concern Present (05/10/2022)   [employer] of Occupational Health - Occupational Stress  Questionnaire    Feeling of Stress : Only a little  Social Connections: Moderately Integrated (05/07/2021)   Social Connection and Isolation Panel [NHANES]    Frequency of Communication with Friends and Family: More than three times a week    Frequency of Social Gatherings with Friends and Family: Three times a week    Attends Religious Services: More than 4 times per year    Active Member of Clubs or Organizations: No    Attends Banker Meetings: Never    Marital Status: Married    Objective:  BP 120/80   Pulse 97   Temp 97.8 F (36.6 C)   Ht 5\' 6"  (1.676 m)   Wt 169 lb (76.7 kg)   SpO2 91%   BMI 27.28 kg/m      08/23/2022    9:44 AM 05/03/2022    9:50 AM 01/27/2022    9:17 AM  BP/Weight  Systolic BP 120 132 134  Diastolic BP 80 80 76  Wt. (Lbs) 169 165 161.6  BMI 27.28 kg/m2 26.63 kg/m2 26.08 kg/m2    Physical Exam Vitals reviewed.  Constitutional:      Appearance: Normal appearance. She is normal weight.  Neck:     Vascular: No carotid bruit.  Cardiovascular:     Rate and Rhythm: Normal rate and regular rhythm.     Heart sounds: Normal heart sounds.  Pulmonary:     Effort: Pulmonary effort is normal. No respiratory distress.     Breath sounds: Normal breath sounds.  Chest:  Breasts:    Right: Normal.  Abdominal:     General: Abdomen is flat. Bowel sounds are normal.     Palpations: Abdomen is soft.     Tenderness: There is no abdominal tenderness.  Neurological:     Mental Status: She is alert and oriented to person, place, and time.  Psychiatric:        Mood and Affect: Mood normal.        Behavior: Behavior normal.      Diabetic Foot Exam - Simple   No data filed      Lab Results  Component Value Date   WBC 6.1 05/03/2022   HGB 14.7 05/03/2022   HCT 44.4 05/03/2022   PLT 349 05/03/2022   GLUCOSE 109 (H) 08/23/2022   CHOL 147 10/19/2021   TRIG 147 10/19/2021   HDL 61 10/19/2021   LDLCALC 61 10/19/2021   ALT 18 08/23/2022   AST 17 08/23/2022   NA 140 08/23/2022   K 5.2 08/23/2022   CL 102 08/23/2022   CREATININE 0.64 08/23/2022   BUN 8 08/23/2022   CO2 26 08/23/2022   TSH 2.010 05/03/2022   HGBA1C 6.7 (H) 08/23/2022   MICROALBUR 10 05/29/2020      Assessment & Plan:    Prediabetes Assessment & Plan: Recommend continue to work on eating healthy diet and exercise.   Orders: -     Comprehensive metabolic panel -     Hemoglobin A1c  GAD (generalized anxiety disorder) Assessment & Plan: Continue on effexor xr 150 mg one daily in am. Lorazepam 0.5 mg 1 pill as needed during the day and patient takes 1 pill in evening. Takes ambien at night for sleep.     Age-related osteoporosis without current pathological fracture Assessment & Plan: Continue Prolia 60 mg every 6 months, Vitamin D 1000 U q   Idiopathic peripheral neuropathy Assessment & Plan: The current medical regimen is effective;  continue present plan  and medications.   Gastroesophageal reflux disease without esophagitis Assessment & Plan: Continue omeprazole and pepcid.   Mixed hyperlipidemia Assessment & Plan: Well controlled.  No changes to medicines. Continue crestor 20 mg before bed.  Continue to work on eating a healthy diet and exercise.  Labs drawn today.        No orders of the defined types were placed in this encounter.   Orders Placed This Encounter  Procedures   Comprehensive metabolic panel   Hemoglobin A1c     Follow-up: Return in about 4 months (around 12/23/2022) for chronic.   I,Marla I Leal-Borjas,acting as a scribe for Blane Ohara, MD.,have documented all relevant  documentation on the behalf of Blane Ohara, MD,as directed by  Blane Ohara, MD while in the presence of Blane Ohara, MD.   An After Visit Summary was printed and given to the patient.  Blane Ohara, MD [facility] [phone]

## 2022-08-23 ENCOUNTER — Ambulatory Visit (INDEPENDENT_AMBULATORY_CARE_PROVIDER_SITE_OTHER): Payer: Medicare HMO | Admitting: Family Medicine

## 2022-08-23 ENCOUNTER — Encounter: Payer: Self-pay | Admitting: Family Medicine

## 2022-08-23 VITALS — BP 120/80 | HR 97 | Temp 97.8°F | Ht 66.0 in | Wt 169.0 lb

## 2022-08-23 DIAGNOSIS — R7303 Prediabetes: Secondary | ICD-10-CM | POA: Diagnosis not present

## 2022-08-23 DIAGNOSIS — G609 Hereditary and idiopathic neuropathy, unspecified: Secondary | ICD-10-CM

## 2022-08-23 DIAGNOSIS — K219 Gastro-esophageal reflux disease without esophagitis: Secondary | ICD-10-CM | POA: Diagnosis not present

## 2022-08-23 DIAGNOSIS — E782 Mixed hyperlipidemia: Secondary | ICD-10-CM

## 2022-08-23 DIAGNOSIS — M810 Age-related osteoporosis without current pathological fracture: Secondary | ICD-10-CM | POA: Diagnosis not present

## 2022-08-23 DIAGNOSIS — F411 Generalized anxiety disorder: Secondary | ICD-10-CM

## 2022-08-23 LAB — COMPREHENSIVE METABOLIC PANEL
ALT: 18 IU/L (ref 0–32)
AST: 17 IU/L (ref 0–40)
Albumin: 4.6 g/dL (ref 3.8–4.8)
Alkaline Phosphatase: 95 IU/L (ref 44–121)
BUN/Creatinine Ratio: 13 (ref 12–28)
BUN: 8 mg/dL (ref 8–27)
Bilirubin Total: 0.4 mg/dL (ref 0.0–1.2)
CO2: 26 mmol/L (ref 20–29)
Calcium: 9.6 mg/dL (ref 8.7–10.3)
Chloride: 102 mmol/L (ref 96–106)
Creatinine, Ser: 0.64 mg/dL (ref 0.57–1.00)
Globulin, Total: 2.2 g/dL (ref 1.5–4.5)
Glucose: 109 mg/dL — ABNORMAL HIGH (ref 70–99)
Potassium: 5.2 mmol/L (ref 3.5–5.2)
Sodium: 140 mmol/L (ref 134–144)
Total Protein: 6.8 g/dL (ref 6.0–8.5)
eGFR: 93 mL/min/{1.73_m2} (ref >59)

## 2022-08-23 LAB — HEMOGLOBIN A1C
Est. average glucose Bld gHb Est-mCnc: 146 mg/dL
Hgb A1c MFr Bld: 6.7 % — ABNORMAL HIGH (ref 4.8–5.6)

## 2022-08-24 DIAGNOSIS — M461 Sacroiliitis, not elsewhere classified: Secondary | ICD-10-CM | POA: Diagnosis not present

## 2022-08-24 DIAGNOSIS — M5387 Other specified dorsopathies, lumbosacral region: Secondary | ICD-10-CM | POA: Diagnosis not present

## 2022-08-24 DIAGNOSIS — M7918 Myalgia, other site: Secondary | ICD-10-CM | POA: Diagnosis not present

## 2022-08-24 DIAGNOSIS — M9903 Segmental and somatic dysfunction of lumbar region: Secondary | ICD-10-CM | POA: Diagnosis not present

## 2022-08-24 DIAGNOSIS — M9905 Segmental and somatic dysfunction of pelvic region: Secondary | ICD-10-CM | POA: Diagnosis not present

## 2022-08-24 DIAGNOSIS — M9902 Segmental and somatic dysfunction of thoracic region: Secondary | ICD-10-CM | POA: Diagnosis not present

## 2022-08-25 ENCOUNTER — Encounter: Payer: Self-pay | Admitting: Family Medicine

## 2022-08-25 DIAGNOSIS — N6001 Solitary cyst of right breast: Secondary | ICD-10-CM | POA: Diagnosis not present

## 2022-08-25 DIAGNOSIS — Z1239 Encounter for other screening for malignant neoplasm of breast: Secondary | ICD-10-CM | POA: Diagnosis not present

## 2022-08-25 DIAGNOSIS — N6341 Unspecified lump in right breast, subareolar: Secondary | ICD-10-CM | POA: Diagnosis not present

## 2022-08-25 DIAGNOSIS — R92321 Mammographic fibroglandular density, right breast: Secondary | ICD-10-CM | POA: Diagnosis not present

## 2022-08-31 DIAGNOSIS — M9905 Segmental and somatic dysfunction of pelvic region: Secondary | ICD-10-CM | POA: Diagnosis not present

## 2022-08-31 DIAGNOSIS — M5387 Other specified dorsopathies, lumbosacral region: Secondary | ICD-10-CM | POA: Diagnosis not present

## 2022-08-31 DIAGNOSIS — M9903 Segmental and somatic dysfunction of lumbar region: Secondary | ICD-10-CM | POA: Diagnosis not present

## 2022-08-31 DIAGNOSIS — M7918 Myalgia, other site: Secondary | ICD-10-CM | POA: Diagnosis not present

## 2022-08-31 DIAGNOSIS — M9902 Segmental and somatic dysfunction of thoracic region: Secondary | ICD-10-CM | POA: Diagnosis not present

## 2022-08-31 DIAGNOSIS — M461 Sacroiliitis, not elsewhere classified: Secondary | ICD-10-CM | POA: Diagnosis not present

## 2022-09-02 DIAGNOSIS — H353221 Exudative age-related macular degeneration, left eye, with active choroidal neovascularization: Secondary | ICD-10-CM | POA: Diagnosis not present

## 2022-09-07 DIAGNOSIS — M461 Sacroiliitis, not elsewhere classified: Secondary | ICD-10-CM | POA: Diagnosis not present

## 2022-09-07 DIAGNOSIS — M7918 Myalgia, other site: Secondary | ICD-10-CM | POA: Diagnosis not present

## 2022-09-07 DIAGNOSIS — M9905 Segmental and somatic dysfunction of pelvic region: Secondary | ICD-10-CM | POA: Diagnosis not present

## 2022-09-07 DIAGNOSIS — M5387 Other specified dorsopathies, lumbosacral region: Secondary | ICD-10-CM | POA: Diagnosis not present

## 2022-09-07 DIAGNOSIS — M9902 Segmental and somatic dysfunction of thoracic region: Secondary | ICD-10-CM | POA: Diagnosis not present

## 2022-09-07 DIAGNOSIS — M9903 Segmental and somatic dysfunction of lumbar region: Secondary | ICD-10-CM | POA: Diagnosis not present

## 2022-09-10 ENCOUNTER — Other Ambulatory Visit: Payer: Self-pay

## 2022-09-10 DIAGNOSIS — R3989 Other symptoms and signs involving the genitourinary system: Secondary | ICD-10-CM | POA: Diagnosis not present

## 2022-09-10 DIAGNOSIS — R300 Dysuria: Secondary | ICD-10-CM | POA: Diagnosis not present

## 2022-09-14 DIAGNOSIS — M9903 Segmental and somatic dysfunction of lumbar region: Secondary | ICD-10-CM | POA: Diagnosis not present

## 2022-09-14 DIAGNOSIS — M5387 Other specified dorsopathies, lumbosacral region: Secondary | ICD-10-CM | POA: Diagnosis not present

## 2022-09-14 DIAGNOSIS — M461 Sacroiliitis, not elsewhere classified: Secondary | ICD-10-CM | POA: Diagnosis not present

## 2022-09-14 DIAGNOSIS — M9905 Segmental and somatic dysfunction of pelvic region: Secondary | ICD-10-CM | POA: Diagnosis not present

## 2022-09-14 DIAGNOSIS — M7918 Myalgia, other site: Secondary | ICD-10-CM | POA: Diagnosis not present

## 2022-09-14 DIAGNOSIS — M9902 Segmental and somatic dysfunction of thoracic region: Secondary | ICD-10-CM | POA: Diagnosis not present

## 2022-09-21 DIAGNOSIS — M461 Sacroiliitis, not elsewhere classified: Secondary | ICD-10-CM | POA: Diagnosis not present

## 2022-09-21 DIAGNOSIS — M9903 Segmental and somatic dysfunction of lumbar region: Secondary | ICD-10-CM | POA: Diagnosis not present

## 2022-09-21 DIAGNOSIS — M7918 Myalgia, other site: Secondary | ICD-10-CM | POA: Diagnosis not present

## 2022-09-21 DIAGNOSIS — M5387 Other specified dorsopathies, lumbosacral region: Secondary | ICD-10-CM | POA: Diagnosis not present

## 2022-09-21 DIAGNOSIS — M9905 Segmental and somatic dysfunction of pelvic region: Secondary | ICD-10-CM | POA: Diagnosis not present

## 2022-09-21 DIAGNOSIS — M9902 Segmental and somatic dysfunction of thoracic region: Secondary | ICD-10-CM | POA: Diagnosis not present

## 2022-09-28 DIAGNOSIS — M9903 Segmental and somatic dysfunction of lumbar region: Secondary | ICD-10-CM | POA: Diagnosis not present

## 2022-09-28 DIAGNOSIS — M5387 Other specified dorsopathies, lumbosacral region: Secondary | ICD-10-CM | POA: Diagnosis not present

## 2022-09-28 DIAGNOSIS — M7918 Myalgia, other site: Secondary | ICD-10-CM | POA: Diagnosis not present

## 2022-09-28 DIAGNOSIS — M9902 Segmental and somatic dysfunction of thoracic region: Secondary | ICD-10-CM | POA: Diagnosis not present

## 2022-09-28 DIAGNOSIS — M461 Sacroiliitis, not elsewhere classified: Secondary | ICD-10-CM | POA: Diagnosis not present

## 2022-09-28 DIAGNOSIS — M9905 Segmental and somatic dysfunction of pelvic region: Secondary | ICD-10-CM | POA: Diagnosis not present

## 2022-10-06 DIAGNOSIS — M9903 Segmental and somatic dysfunction of lumbar region: Secondary | ICD-10-CM | POA: Diagnosis not present

## 2022-10-06 DIAGNOSIS — M9902 Segmental and somatic dysfunction of thoracic region: Secondary | ICD-10-CM | POA: Diagnosis not present

## 2022-10-06 DIAGNOSIS — M9905 Segmental and somatic dysfunction of pelvic region: Secondary | ICD-10-CM | POA: Diagnosis not present

## 2022-10-06 DIAGNOSIS — M461 Sacroiliitis, not elsewhere classified: Secondary | ICD-10-CM | POA: Diagnosis not present

## 2022-10-06 DIAGNOSIS — M5387 Other specified dorsopathies, lumbosacral region: Secondary | ICD-10-CM | POA: Diagnosis not present

## 2022-10-06 DIAGNOSIS — M7918 Myalgia, other site: Secondary | ICD-10-CM | POA: Diagnosis not present

## 2022-10-07 DIAGNOSIS — H353221 Exudative age-related macular degeneration, left eye, with active choroidal neovascularization: Secondary | ICD-10-CM | POA: Diagnosis not present

## 2022-10-08 ENCOUNTER — Other Ambulatory Visit: Payer: Self-pay | Admitting: Family Medicine

## 2022-10-08 DIAGNOSIS — F411 Generalized anxiety disorder: Secondary | ICD-10-CM

## 2022-10-11 ENCOUNTER — Other Ambulatory Visit: Payer: Self-pay | Admitting: Family Medicine

## 2022-10-11 DIAGNOSIS — F411 Generalized anxiety disorder: Secondary | ICD-10-CM

## 2022-10-11 MED ORDER — LORAZEPAM 0.5 MG PO TABS
0.5000 mg | ORAL_TABLET | Freq: Three times a day (TID) | ORAL | 2 refills | Status: DC | PRN
Start: 2022-10-11 — End: 2023-03-25

## 2022-10-12 DIAGNOSIS — M461 Sacroiliitis, not elsewhere classified: Secondary | ICD-10-CM | POA: Diagnosis not present

## 2022-10-12 DIAGNOSIS — M7918 Myalgia, other site: Secondary | ICD-10-CM | POA: Diagnosis not present

## 2022-10-12 DIAGNOSIS — M9902 Segmental and somatic dysfunction of thoracic region: Secondary | ICD-10-CM | POA: Diagnosis not present

## 2022-10-12 DIAGNOSIS — M9903 Segmental and somatic dysfunction of lumbar region: Secondary | ICD-10-CM | POA: Diagnosis not present

## 2022-10-12 DIAGNOSIS — M5387 Other specified dorsopathies, lumbosacral region: Secondary | ICD-10-CM | POA: Diagnosis not present

## 2022-10-12 DIAGNOSIS — M9905 Segmental and somatic dysfunction of pelvic region: Secondary | ICD-10-CM | POA: Diagnosis not present

## 2022-10-18 ENCOUNTER — Ambulatory Visit (INDEPENDENT_AMBULATORY_CARE_PROVIDER_SITE_OTHER): Payer: Medicare HMO | Admitting: Family Medicine

## 2022-10-18 ENCOUNTER — Encounter: Payer: Self-pay | Admitting: Family Medicine

## 2022-10-18 VITALS — BP 140/80 | HR 96 | Temp 97.2°F | Resp 14 | Ht 66.0 in | Wt 168.0 lb

## 2022-10-18 DIAGNOSIS — M546 Pain in thoracic spine: Secondary | ICD-10-CM | POA: Diagnosis not present

## 2022-10-18 DIAGNOSIS — R937 Abnormal findings on diagnostic imaging of other parts of musculoskeletal system: Secondary | ICD-10-CM | POA: Diagnosis not present

## 2022-10-18 DIAGNOSIS — G8929 Other chronic pain: Secondary | ICD-10-CM | POA: Insufficient documentation

## 2022-10-18 MED ORDER — HYDROCODONE-ACETAMINOPHEN 5-325 MG PO TABS
1.0000 | ORAL_TABLET | Freq: Four times a day (QID) | ORAL | 0 refills | Status: DC | PRN
Start: 2022-10-18 — End: 2022-10-21

## 2022-10-18 MED ORDER — NABUMETONE 750 MG PO TABS
750.0000 mg | ORAL_TABLET | Freq: Every day | ORAL | 0 refills | Status: DC
Start: 2022-10-18 — End: 2022-11-15

## 2022-10-18 NOTE — Patient Instructions (Addendum)
Start nabumetone 750 mg once daily. No ibuprofen. Start hydrocodone/apap 5/325 mg four times a day as needed severe back pain.

## 2022-10-18 NOTE — Progress Notes (Signed)
Acute Office Visit  Subjective:    Patient ID: Virginia Montoya, female    DOB: [birth year], 72 y.o.   MRN: [id number]  Chief Complaint  Patient presents with   Back Pain    HPI: Patient is in today for acute sharp and aching constant pain on the thoracic and lower back since 7 days ago. She pickup a heavy flower pot last Saturday. She tried heat patches and TENs. Went to chiropractor on last Tuesday.   She has tried cyclobenzaprine, tizanidine, tramadol, ibuprofen, and gabapentin, but they did not help her. All make her sleepy.   Past Medical History:  Diagnosis Date   Chronic insomnia    Depression    Gastroesophageal reflux disease    Hyperlipidemia    Osteoporosis    Precordial chest pain 11/17/2016   Prediabetes     Past Surgical History:  Procedure Laterality Date   CHOLECYSTECTOMY  1999   CORONARY CALCIUM SCORE/CORONARY CTA  01/2017   Coronary calcium score is 0.  Normal coronary origin with normal right dominance.  No evidence of CAD.  Most likely noncardiac chest pain.   SHOULDER SURGERY  2005   TONSILLECTOMY     TUBAL LIGATION  1977    Family History  Problem Relation Age of Onset   Diabetes Mother    Hyperlipidemia Mother    Emphysema Father    Diabetes Maternal Grandmother    Liver cancer Maternal Grandmother     Social History   Socioeconomic History   Marital status: Married    Spouse name: Lyman Bishop   Number of children: 3   Years of education: 12   Highest education level: Associate degree: academic program  Occupational History   Occupation: Retired  Tobacco Use   Smoking status: Never   Smokeless tobacco: Never  Vaping Use   Vaping status: Never Used  Substance and Sexual Activity   Alcohol use: No   Drug use: No   Sexual activity: Not Currently  Other Topics Concern   Not on file  Social History Narrative   She is a married (remarried) mother of 3 with 4 grandchildren.  She has 3 great-grandchildren.  She previously worked at [employer]  in the [employer].  She completed high school and beauty school.   She works outside a lot in the yard on gardening.  She does a lot of walking and exercises with walking for at least a half an hour a day 5 days a week.   wears sunscreen, brushes and flosses daily, see's dentist bi-annually, has smoke/carbon monoxide detectors, wears a seatbelt and practices gun safety   Social Determinants of Health   Financial Resource Strain: Low Risk  (05/07/2021)   Overall Financial Resource Strain (CARDIA)    Difficulty of Paying Living Expenses: Not hard at all  Food Insecurity: No Food Insecurity (05/10/2022)   Hunger Vital Sign    Worried About Running Out of Food in the Last Year: Never true    Ran Out of Food in the Last Year: Never true  Transportation Needs: No Transportation Needs (05/10/2022)   PRAPARE - Administrator, Civil Service (Medical): No    Lack of Transportation (Non-Medical): No  Physical Activity: Inactive (08/23/2022)   Exercise Vital Sign    Days of Exercise per Week: 0 days    Minutes of Exercise per Session: 0 min  Stress: No Stress Concern Present (05/10/2022)   [employer] of Occupational Health - Occupational Stress Questionnaire  Feeling of Stress : Only a little  Social Connections: Moderately Integrated (05/07/2021)   Social Connection and Isolation Panel [NHANES]    Frequency of Communication with Friends and Family: More than three times a week    Frequency of Social Gatherings with Friends and Family: Three times a week    Attends Religious Services: More than 4 times per year    Active Member of Clubs or Organizations: No    Attends Banker Meetings: Never    Marital Status: Married  Catering manager Violence: Not At Risk (05/10/2022)   Humiliation, Afraid, Rape, and Kick questionnaire    Fear of Current or Ex-Partner: No    Emotionally Abused: No    Physically Abused: No    Sexually Abused: No    Outpatient Medications Prior  to Visit  Medication Sig Dispense Refill   acetaminophen (TYLENOL) 500 MG tablet Take 500 mg every 6 (six) hours as needed by mouth.     cholecalciferol (VITAMIN D3) 25 MCG (1000 UNIT) tablet Take 1,000 Units by mouth 2 (two) times daily.     famotidine (PEPCID) 20 MG tablet Take 1 tablet (20 mg total) by mouth 2 (two) times daily. 180 tablet 3   fluticasone (FLONASE) 50 MCG/ACT nasal spray SHAKE LIQUID AND USE 2 SPRAYS IN EACH NOSTRIL DAILY 16 g 6   LORazepam (ATIVAN) 0.5 MG tablet Take 1 tablet (0.5 mg total) by mouth every 8 (eight) hours as needed for anxiety. 90 tablet 2   Multiple Vitamins-Minerals (PRESERVISION AREDS 2 PO) Take by mouth.     omeprazole (PRILOSEC) 20 MG capsule TAKE 1 CAPSULE(20 MG) BY MOUTH DAILY 90 capsule 1   rosuvastatin (CRESTOR) 20 MG tablet TAKE 1 TABLET(20 MG) BY MOUTH AT BEDTIME 90 tablet 1   venlafaxine XR (EFFEXOR-XR) 150 MG 24 hr capsule Take one tablet by mouth daily. 90 capsule 1   vitamin B-12 (CYANOCOBALAMIN) 1000 MCG tablet Take 1,000 mcg by mouth daily.     zolpidem (AMBIEN) 10 MG tablet TAKE 1 TABLET BY MOUTH AT BEDTIME 90 tablet 1   No facility-administered medications prior to visit.    Allergies  Allergen Reactions   Cephalexin Nausea And Vomiting    ask   Alendronate     Gi upset   Biaxin [Clarithromycin] Other (See Comments)    CHEST PAIN   Ciprofloxacin     Severe reflux   Macrobid [Nitrofurantoin] Nausea And Vomiting    Stomach upset   Sulfa Antibiotics     Made her feel poorly.     Review of Systems  Constitutional:  Negative for chills, fatigue and fever.  HENT:  Negative for congestion, ear pain and sore throat.   Respiratory:  Negative for cough and shortness of breath.   Cardiovascular:  Negative for chest pain and palpitations.  Gastrointestinal:  Negative for abdominal pain, constipation, diarrhea, nausea and vomiting.  Endocrine: Negative for polydipsia, polyphagia and polyuria.  Genitourinary:  Negative for difficulty  urinating and dysuria.  Musculoskeletal:  Positive for back pain. Negative for arthralgias and myalgias.  Skin:  Negative for rash.  Neurological:  Negative for headaches.  Psychiatric/Behavioral:  Negative for dysphoric mood. The patient is not nervous/anxious.        Objective:        10/18/2022    8:29 AM 08/23/2022    9:44 AM 05/03/2022    9:50 AM  Vitals with BMI  Height 5\' 6"  5\' 6"  5\' 6"   Weight 168 lbs 169 lbs  165 lbs  BMI 27.13 27.29 26.64  Systolic 140 120 161  Diastolic 80 80 80  Pulse 96 97 77    No data found.   Physical Exam Vitals reviewed.  Constitutional:      Appearance: Normal appearance. She is normal weight.  Cardiovascular:     Rate and Rhythm: Normal rate and regular rhythm.     Heart sounds: Normal heart sounds.  Pulmonary:     Effort: Pulmonary effort is normal. No respiratory distress.     Breath sounds: Normal breath sounds.  Musculoskeletal:        General: Tenderness (thoracic spine. BL thoracic paraspinal muscle tenderness.) present.  Neurological:     Mental Status: She is alert and oriented to person, place, and time.  Psychiatric:        Mood and Affect: Mood normal.        Behavior: Behavior normal.     Health Maintenance Due  Topic Date Due   INFLUENZA VACCINE  08/05/2022   COVID-19 Vaccine (5 - 2023-24 season) 09/05/2022    There are no preventive care reminders to display for this patient.   Lab Results  Component Value Date   TSH 2.010 05/03/2022   Lab Results  Component Value Date   WBC 6.1 05/03/2022   HGB 14.7 05/03/2022   HCT 44.4 05/03/2022   MCV 89 05/03/2022   PLT 349 05/03/2022   Lab Results  Component Value Date   NA 140 08/23/2022   K 5.2 08/23/2022   CO2 26 08/23/2022   GLUCOSE 109 (H) 08/23/2022   BUN 8 08/23/2022   CREATININE 0.64 08/23/2022   BILITOT 0.4 08/23/2022   ALKPHOS 95 08/23/2022   AST 17 08/23/2022   ALT 18 08/23/2022   PROT 6.8 08/23/2022   ALBUMIN 4.6 08/23/2022   CALCIUM 9.6  08/23/2022   EGFR 93 08/23/2022   Lab Results  Component Value Date   CHOL 147 10/19/2021   Lab Results  Component Value Date   HDL 61 10/19/2021   Lab Results  Component Value Date   LDLCALC 61 10/19/2021   Lab Results  Component Value Date   TRIG 147 10/19/2021   Lab Results  Component Value Date   CHOLHDL 2.4 10/19/2021   Lab Results  Component Value Date   HGBA1C 6.7 (H) 08/23/2022       Assessment & Plan:  Acute midline thoracic back pain Assessment & Plan: Start nabumetone 750 mg once daily.  Start hydrocodne/apap 5/325 mg four times a day as needed severe back pain.  Get xray to rule out compression fracture.   Orders: -     DG Thoracic Spine 2 View; Future -     Nabumetone; Take 1 tablet (750 mg total) by mouth daily.  Dispense: 30 tablet; Refill: 0 -     HYDROcodone-Acetaminophen; Take 1 tablet by mouth every 6 (six) hours as needed for severe pain (OSteoarthritis of lumbar spine.).  Dispense: 16 tablet; Refill: 0     Meds ordered this encounter  Medications   nabumetone (RELAFEN) 750 MG tablet    Sig: Take 1 tablet (750 mg total) by mouth daily.    Dispense:  30 tablet    Refill:  0   HYDROcodone-acetaminophen (NORCO/VICODIN) 5-325 MG tablet    Sig: Take 1 tablet by mouth every 6 (six) hours as needed for severe pain (OSteoarthritis of lumbar spine.).    Dispense:  16 tablet    Refill:  0    Orders  Placed This Encounter  Procedures   DG Thoracic Spine 2 View     Follow-up: Return if symptoms worsen or fail to improve.  An After Visit Summary was printed and given to the patient.  Blane Ohara, MD [facility] [phone]

## 2022-10-18 NOTE — Assessment & Plan Note (Signed)
Start nabumetone 750 mg once daily.  Start hydrocodne/apap 5/325 mg four times a day as needed severe back pain.  Get xray to rule out compression fracture.

## 2022-10-19 ENCOUNTER — Other Ambulatory Visit: Payer: Self-pay | Admitting: Family Medicine

## 2022-10-19 ENCOUNTER — Other Ambulatory Visit: Payer: Self-pay

## 2022-10-19 DIAGNOSIS — S22000A Wedge compression fracture of unspecified thoracic vertebra, initial encounter for closed fracture: Secondary | ICD-10-CM

## 2022-10-19 DIAGNOSIS — M546 Pain in thoracic spine: Secondary | ICD-10-CM

## 2022-10-21 ENCOUNTER — Other Ambulatory Visit: Payer: Self-pay

## 2022-10-21 DIAGNOSIS — S22000A Wedge compression fracture of unspecified thoracic vertebra, initial encounter for closed fracture: Secondary | ICD-10-CM

## 2022-10-21 DIAGNOSIS — M546 Pain in thoracic spine: Secondary | ICD-10-CM

## 2022-10-21 MED ORDER — HYDROCODONE-ACETAMINOPHEN 5-325 MG PO TABS: 1.0000 | ORAL_TABLET | Freq: Four times a day (QID) | ORAL | 0 refills | Status: DC | PRN

## 2022-10-28 DIAGNOSIS — Z6827 Body mass index (BMI) 27.0-27.9, adult: Secondary | ICD-10-CM | POA: Diagnosis not present

## 2022-10-28 DIAGNOSIS — S22080A Wedge compression fracture of T11-T12 vertebra, initial encounter for closed fracture: Secondary | ICD-10-CM | POA: Diagnosis not present

## 2022-10-30 ENCOUNTER — Telehealth: Payer: Self-pay

## 2022-10-30 ENCOUNTER — Other Ambulatory Visit: Payer: Self-pay

## 2022-10-30 MED ORDER — AMOXICILLIN 500 MG PO CAPS: 500.0000 mg | ORAL_CAPSULE | Freq: Three times a day (TID) | ORAL | 0 refills | Status: DC

## 2022-10-30 NOTE — Telephone Encounter (Signed)
Virginia Montoya called reporting urinary burning, frequency that started this morning. States she has had urine infections before and symptoms do feel like that.  Has taken Azo already.  Since she has many antibiotic side effects, she states amoxicillin works for her.  Sent 5 days of Amoxicillin to her pharmacy.  Advised to be evaluated of any worsening symptoms.  Thanks, Windell Moment MD

## 2022-11-03 ENCOUNTER — Ambulatory Visit (INDEPENDENT_AMBULATORY_CARE_PROVIDER_SITE_OTHER): Payer: Medicare HMO | Admitting: Family Medicine

## 2022-11-03 ENCOUNTER — Encounter: Payer: Self-pay | Admitting: Family Medicine

## 2022-11-03 VITALS — BP 132/78 | HR 99 | Temp 97.6°F | Ht 66.0 in | Wt 167.0 lb

## 2022-11-03 DIAGNOSIS — M546 Pain in thoracic spine: Secondary | ICD-10-CM | POA: Diagnosis not present

## 2022-11-03 DIAGNOSIS — N3001 Acute cystitis with hematuria: Secondary | ICD-10-CM | POA: Diagnosis not present

## 2022-11-03 DIAGNOSIS — R300 Dysuria: Secondary | ICD-10-CM

## 2022-11-03 LAB — POCT URINALYSIS DIP (CLINITEK)
Glucose, UA: NEGATIVE mg/dL
Ketones, POC UA: NEGATIVE mg/dL
Nitrite, UA: NEGATIVE
POC PROTEIN,UA: 30 — AB
Spec Grav, UA: 1.02 (ref 1.010–1.025)
Urobilinogen, UA: 0.2 U/dL
pH, UA: 6 (ref 5.0–8.0)

## 2022-11-03 MED ORDER — DOXYCYCLINE HYCLATE 100 MG PO TABS
100.0000 mg | ORAL_TABLET | Freq: Two times a day (BID) | ORAL | 0 refills | Status: DC
Start: 2022-11-03 — End: 2022-12-26

## 2022-11-03 MED ORDER — KETOROLAC TROMETHAMINE 60 MG/2ML IM SOLN
60.0000 mg | Freq: Once | INTRAMUSCULAR | Status: AC
  Administered 2022-11-03: 60 mg via INTRAMUSCULAR

## 2022-11-03 MED ORDER — CEFTRIAXONE SODIUM 1 G IJ SOLR
1.0000 g | Freq: Once | INTRAMUSCULAR | Status: AC
Start: 2022-11-03 — End: 2022-11-03
  Administered 2022-11-03: 1 g via INTRAMUSCULAR

## 2022-11-03 NOTE — Progress Notes (Signed)
Acute Office Visit  Subjective:    Patient ID: Virginia Montoya, female    DOB: [birth year], 72 y.o.   MRN: [id number]  Chief Complaint  Patient presents with   Urinary Tract Infection    HPI: Patient is in today for UTI symptoms which started on Saturday. Has taken azo which helped some. Dr. Faylene Kurtz sent amoxicillin in over the weekend. States amoxicillin has not helped very much. Still has dysuria and lower back pain, it has stopped the incontinence. Has seen very little blood since taking the amoxicillin.  Per 10/30/22 Telephone note: "Virginia Montoya called reporting urinary burning, frequency that started this morning. States she has had urine infections before and symptoms do feel like that.  Has taken Azo already. Since she has many antibiotic side effects, she states amoxicillin works for her.  Sent 5 days of Amoxicillin to her pharmacy. Advised to be evaluated of any worsening symptoms." Dr. Faylene Kurtz.  Patient saw [facility]. Saw NP and gave her methocarbamol and hydrocodone/apap. Hydrocodone takes about 2 hours to kick in and then lasts about an hour. Using heat and recliner. Got a back brace which makes it hurt worse.    Past Medical History:  Diagnosis Date   Chronic insomnia    Depression    Gastroesophageal reflux disease    Hyperlipidemia    Osteoporosis    Precordial chest pain 11/17/2016   Prediabetes     Past Surgical History:  Procedure Laterality Date   CHOLECYSTECTOMY  1999   CORONARY CALCIUM SCORE/CORONARY CTA  01/2017   Coronary calcium score is 0.  Normal coronary origin with normal right dominance.  No evidence of CAD.  Most likely noncardiac chest pain.   SHOULDER SURGERY  2005   TONSILLECTOMY     TUBAL LIGATION  1977    Family History  Problem Relation Age of Onset   Diabetes Mother    Hyperlipidemia Mother    Emphysema Father    Diabetes Maternal Grandmother    Liver cancer Maternal Grandmother     Social History   Socioeconomic History    Marital status: Married    Spouse name: Virginia Montoya   Number of children: 3   Years of education: 12   Highest education level: Associate degree: academic program  Occupational History   Occupation: Retired  Tobacco Use   Smoking status: Never   Smokeless tobacco: Never  Vaping Use   Vaping status: Never Used  Substance and Sexual Activity   Alcohol use: No   Drug use: No   Sexual activity: Not Currently  Other Topics Concern   Not on file  Social History Narrative   She is a married (remarried) mother of 3 with 4 grandchildren.  She has 3 great-grandchildren.  She previously worked at [employer] in the [employer].  She completed high school and beauty school.   She works outside a lot in the yard on gardening.  She does a lot of walking and exercises with walking for at least a half an hour a day 5 days a week.   wears sunscreen, brushes and flosses daily, see's dentist bi-annually, has smoke/carbon monoxide detectors, wears a seatbelt and practices gun safety   Social Determinants of Health   Financial Resource Strain: Low Risk  (05/07/2021)   Overall Financial Resource Strain (CARDIA)    Difficulty of Paying Living Expenses: Not hard at all  Food Insecurity: No Food Insecurity (05/10/2022)   Hunger Vital Sign    Worried About Running Out of  Food in the Last Year: Never true    Ran Out of Food in the Last Year: Never true  Transportation Needs: No Transportation Needs (05/10/2022)   PRAPARE - Administrator, Civil Service (Medical): No    Lack of Transportation (Non-Medical): No  Physical Activity: Inactive (08/23/2022)   Exercise Vital Sign    Days of Exercise per Week: 0 days    Minutes of Exercise per Session: 0 min  Stress: No Stress Concern Present (05/10/2022)   [employer] of Occupational Health - Occupational Stress Questionnaire    Feeling of Stress : Only a little  Social Connections: Moderately Integrated (05/07/2021)   Social Connection and Isolation  Panel [NHANES]    Frequency of Communication with Friends and Family: More than three times a week    Frequency of Social Gatherings with Friends and Family: Three times a week    Attends Religious Services: More than 4 times per year    Active Member of Clubs or Organizations: No    Attends Banker Meetings: Never    Marital Status: Married  Catering manager Violence: Not At Risk (05/10/2022)   Humiliation, Afraid, Rape, and Kick questionnaire    Fear of Current or Ex-Partner: No    Emotionally Abused: No    Physically Abused: No    Sexually Abused: No    Outpatient Medications Prior to Visit  Medication Sig Dispense Refill   acetaminophen (TYLENOL) 500 MG tablet Take 500 mg every 6 (six) hours as needed by mouth.     cholecalciferol (VITAMIN D3) 25 MCG (1000 UNIT) tablet Take 1,000 Units by mouth 2 (two) times daily.     famotidine (PEPCID) 20 MG tablet Take 1 tablet (20 mg total) by mouth 2 (two) times daily. 180 tablet 3   fluticasone (FLONASE) 50 MCG/ACT nasal spray SHAKE LIQUID AND USE 2 SPRAYS IN EACH NOSTRIL DAILY 16 g 6   HYDROcodone-acetaminophen (NORCO/VICODIN) 5-325 MG tablet Take 1 tablet by mouth every 6 (six) hours as needed for severe pain (pain score 7-10) (OSteoarthritis of lumbar spine.). 16 tablet 0   LORazepam (ATIVAN) 0.5 MG tablet Take 1 tablet (0.5 mg total) by mouth every 8 (eight) hours as needed for anxiety. 90 tablet 2   Multiple Vitamins-Minerals (PRESERVISION AREDS 2 PO) Take by mouth.     nabumetone (RELAFEN) 750 MG tablet Take 1 tablet (750 mg total) by mouth daily. 30 tablet 0   omeprazole (PRILOSEC) 20 MG capsule TAKE 1 CAPSULE(20 MG) BY MOUTH DAILY 90 capsule 1   venlafaxine XR (EFFEXOR-XR) 150 MG 24 hr capsule Take one tablet by mouth daily. 90 capsule 1   vitamin B-12 (CYANOCOBALAMIN) 1000 MCG tablet Take 1,000 mcg by mouth daily.     zolpidem (AMBIEN) 10 MG tablet TAKE 1 TABLET BY MOUTH AT BEDTIME 90 tablet 1   amoxicillin (AMOXIL) 500 MG  capsule Take 1 capsule (500 mg total) by mouth 3 (three) times daily for 5 days. 15 capsule 0   rosuvastatin (CRESTOR) 20 MG tablet TAKE 1 TABLET(20 MG) BY MOUTH AT BEDTIME 90 tablet 1   No facility-administered medications prior to visit.    Allergies  Allergen Reactions   Cephalexin Nausea And Vomiting    ask   Alendronate     Gi upset   Biaxin [Clarithromycin] Other (See Comments)    CHEST PAIN   Ciprofloxacin     Severe reflux   Macrobid [Nitrofurantoin] Nausea And Vomiting    Stomach upset  Sulfa Antibiotics     Made her feel poorly.     Review of Systems  Constitutional:  Negative for chills and fever.  HENT:  Negative for congestion and sore throat.   Respiratory:  Negative for cough and shortness of breath.   Cardiovascular:  Negative for chest pain.  Gastrointestinal:  Negative for abdominal pain and nausea.  Genitourinary:  Negative for dysuria, flank pain, frequency and urgency.  Musculoskeletal:  Negative for back pain.       Objective:        11/03/2022   10:21 AM 10/18/2022    8:29 AM 08/23/2022    9:44 AM  Vitals with BMI  Height 5\' 6"  5\' 6"  5\' 6"   Weight 167 lbs 168 lbs 169 lbs  BMI 26.97 27.13 27.29  Systolic 132 140 161  Diastolic 78 80 80  Pulse 99 96 97    No data found.   Physical Exam Vitals reviewed.  Constitutional:      Appearance: Normal appearance. She is normal weight.  Neck:     Vascular: No carotid bruit.  Cardiovascular:     Rate and Rhythm: Normal rate and regular rhythm.     Heart sounds: Normal heart sounds.  Pulmonary:     Effort: Pulmonary effort is normal. No respiratory distress.     Breath sounds: Normal breath sounds.  Abdominal:     General: Abdomen is flat. Bowel sounds are normal.     Palpations: Abdomen is soft.     Tenderness: There is no abdominal tenderness.  Musculoskeletal:        General: Tenderness (lumbar paraspinal muscles. NOntender over all vertebrae.) present.  Neurological:     Mental  Status: She is alert and oriented to person, place, and time.  Psychiatric:        Mood and Affect: Mood normal.        Behavior: Behavior normal.     Health Maintenance Due  Topic Date Due   INFLUENZA VACCINE  08/05/2022   COVID-19 Vaccine (5 - 2023-24 season) 09/05/2022    There are no preventive care reminders to display for this patient.   Lab Results  Component Value Date   TSH 2.010 05/03/2022   Lab Results  Component Value Date   WBC 6.1 05/03/2022   HGB 14.7 05/03/2022   HCT 44.4 05/03/2022   MCV 89 05/03/2022   PLT 349 05/03/2022   Lab Results  Component Value Date   NA 140 08/23/2022   K 5.2 08/23/2022   CO2 26 08/23/2022   GLUCOSE 109 (H) 08/23/2022   BUN 8 08/23/2022   CREATININE 0.64 08/23/2022   BILITOT 0.4 08/23/2022   ALKPHOS 95 08/23/2022   AST 17 08/23/2022   ALT 18 08/23/2022   PROT 6.8 08/23/2022   ALBUMIN 4.6 08/23/2022   CALCIUM 9.6 08/23/2022   EGFR 93 08/23/2022   Lab Results  Component Value Date   CHOL 147 10/19/2021   Lab Results  Component Value Date   HDL 61 10/19/2021   Lab Results  Component Value Date   LDLCALC 61 10/19/2021   Lab Results  Component Value Date   TRIG 147 10/19/2021   Lab Results  Component Value Date   CHOLHDL 2.4 10/19/2021   Lab Results  Component Value Date   HGBA1C 6.7 (H) 08/23/2022       Assessment & Plan:  Acute cystitis with hematuria Assessment & Plan: Order rocephin 1g IM #1 Order Urine culture Sent Doxycycline  Orders: -     POCT URINALYSIS DIP (CLINITEK) -     Urine Culture -     cefTRIAXone Sodium -     Doxycycline Hyclate; Take 1 tablet (100 mg total) by mouth 2 (two) times daily.  Dispense: 14 tablet; Refill: 0  Acute midline thoracic back pain Assessment & Plan: Toradol 60 mg IM #1 was given at the office.  Orders: -     Ketorolac Tromethamine     Meds ordered this encounter  Medications   cefTRIAXone (ROCEPHIN) injection 1 g   doxycycline (VIBRA-TABS) 100  MG tablet    Sig: Take 1 tablet (100 mg total) by mouth 2 (two) times daily.    Dispense:  14 tablet    Refill:  0   ketorolac (TORADOL) injection 60 mg    Orders Placed This Encounter  Procedures   Urine Culture   POCT URINALYSIS DIP (CLINITEK)     Follow-up: Return in about 2 weeks (around 11/17/2022) for lab visit UA.  An After Visit Summary was printed and given to the patient.   Clayborn Bigness I Leal-Borjas,acting as a scribe for Blane Ohara, MD.,have documented all relevant documentation on the behalf of Blane Ohara, MD,as directed by  Blane Ohara, MD while in the presence of Blane Ohara, MD.   Blane Ohara, MD [facility] [phone]

## 2022-11-05 ENCOUNTER — Other Ambulatory Visit: Payer: Self-pay

## 2022-11-05 ENCOUNTER — Other Ambulatory Visit: Payer: Self-pay | Admitting: Family Medicine

## 2022-11-05 MED ORDER — CELECOXIB 200 MG PO CAPS: 200.0000 mg | ORAL_CAPSULE | Freq: Every day | ORAL | 1 refills | Status: DC

## 2022-11-06 DIAGNOSIS — N3001 Acute cystitis with hematuria: Secondary | ICD-10-CM | POA: Insufficient documentation

## 2022-11-06 DIAGNOSIS — R300 Dysuria: Secondary | ICD-10-CM | POA: Insufficient documentation

## 2022-11-06 NOTE — Assessment & Plan Note (Signed)
Toradol 60 mg IM #1 was given at the office.

## 2022-11-06 NOTE — Assessment & Plan Note (Signed)
Order rocephin 1g IM #1 Order Urine culture Sent Doxycycline

## 2022-11-06 NOTE — Assessment & Plan Note (Signed)
Check UA 

## 2022-11-07 ENCOUNTER — Other Ambulatory Visit: Payer: Self-pay | Admitting: Family Medicine

## 2022-11-07 LAB — URINE CULTURE

## 2022-11-08 ENCOUNTER — Ambulatory Visit (INDEPENDENT_AMBULATORY_CARE_PROVIDER_SITE_OTHER): Payer: Medicare HMO

## 2022-11-08 DIAGNOSIS — N3001 Acute cystitis with hematuria: Secondary | ICD-10-CM | POA: Diagnosis not present

## 2022-11-08 DIAGNOSIS — Z23 Encounter for immunization: Secondary | ICD-10-CM | POA: Diagnosis not present

## 2022-11-08 MED ORDER — CEFTRIAXONE SODIUM 1 G IJ SOLR
1.0000 g | Freq: Once | INTRAMUSCULAR | Status: AC
  Administered 2022-11-08: 1 g via INTRAMUSCULAR

## 2022-11-08 NOTE — Progress Notes (Signed)
Patient tolerated well rocephin shot.

## 2022-11-09 ENCOUNTER — Ambulatory Visit (INDEPENDENT_AMBULATORY_CARE_PROVIDER_SITE_OTHER): Payer: Medicare HMO

## 2022-11-09 DIAGNOSIS — N3001 Acute cystitis with hematuria: Secondary | ICD-10-CM

## 2022-11-09 DIAGNOSIS — Z23 Encounter for immunization: Secondary | ICD-10-CM | POA: Diagnosis not present

## 2022-11-09 MED ORDER — CEFTRIAXONE SODIUM 1 G IJ SOLR
1.0000 g | Freq: Once | INTRAMUSCULAR | Status: AC
  Administered 2022-11-09: 1 g via INTRAMUSCULAR

## 2022-11-15 ENCOUNTER — Other Ambulatory Visit: Payer: Self-pay | Admitting: Family Medicine

## 2022-11-15 DIAGNOSIS — M546 Pain in thoracic spine: Secondary | ICD-10-CM

## 2022-11-17 ENCOUNTER — Ambulatory Visit: Payer: Medicare HMO

## 2022-11-17 DIAGNOSIS — N3001 Acute cystitis with hematuria: Secondary | ICD-10-CM

## 2022-11-17 LAB — POCT URINALYSIS DIP (CLINITEK)
Bilirubin, UA: NEGATIVE
Blood, UA: NEGATIVE
Glucose, UA: NEGATIVE mg/dL
Ketones, POC UA: NEGATIVE mg/dL
Leukocytes, UA: NEGATIVE
Nitrite, UA: NEGATIVE
POC PROTEIN,UA: NEGATIVE
Spec Grav, UA: 1.015 (ref 1.010–1.025)
Urobilinogen, UA: 0.2 U/dL
pH, UA: 6 (ref 5.0–8.0)

## 2022-11-17 NOTE — Progress Notes (Signed)
Patient presents today 2 week UA recheck. Denies any urinary symptoms. UA normal. Patient informed.

## 2022-11-18 DIAGNOSIS — H353221 Exudative age-related macular degeneration, left eye, with active choroidal neovascularization: Secondary | ICD-10-CM | POA: Diagnosis not present

## 2022-11-22 ENCOUNTER — Ambulatory Visit (INDEPENDENT_AMBULATORY_CARE_PROVIDER_SITE_OTHER): Payer: Medicare HMO | Admitting: Family Medicine

## 2022-11-22 ENCOUNTER — Encounter: Payer: Self-pay | Admitting: Family Medicine

## 2022-11-22 ENCOUNTER — Ambulatory Visit: Payer: Self-pay | Admitting: Family Medicine

## 2022-11-22 VITALS — BP 142/90 | HR 87 | Ht 66.0 in | Wt 164.0 lb

## 2022-11-22 DIAGNOSIS — M25552 Pain in left hip: Secondary | ICD-10-CM | POA: Diagnosis not present

## 2022-11-22 DIAGNOSIS — G609 Hereditary and idiopathic neuropathy, unspecified: Secondary | ICD-10-CM

## 2022-11-22 DIAGNOSIS — M25551 Pain in right hip: Secondary | ICD-10-CM | POA: Insufficient documentation

## 2022-11-22 DIAGNOSIS — M8008XS Age-related osteoporosis with current pathological fracture, vertebra(e), sequela: Secondary | ICD-10-CM | POA: Diagnosis not present

## 2022-11-22 DIAGNOSIS — I10 Essential (primary) hypertension: Secondary | ICD-10-CM | POA: Insufficient documentation

## 2022-11-22 MED ORDER — GABAPENTIN 100 MG PO CAPS: 100.0000 mg | ORAL_CAPSULE | Freq: Three times a day (TID) | ORAL | 3 refills | Status: DC

## 2022-11-22 NOTE — Progress Notes (Signed)
Acute Office Visit  Subjective:    Patient ID: Virginia Montoya, female    DOB: 1950/08/24, 72 y.o.   MRN: [id number]  Chief Complaint  Patient presents with   Hip Pain    Bilateral    HPI: The patient, with a history of multiple vertebral fractures, presents with worsening hip and back pain. She reports that the pain is bilateral in the hips and in the lower back, around the waist. The pain is described as burning, sore, and aching. The patient has been using a TENS unit for pain management, which has allowed her to continue with activities such as vacuuming and mopping, despite the pain. However, she acknowledges that these activities may have exacerbated her condition.  The patient has been wearing a back brace for the fractured vertebrae, which she describes as heavy and uncomfortable. She has also been taking pain medication three times a day, six hours apart, and a muscle relaxer up to four times a day, although she usually only takes it three times due to its sedative effects.  The patient has a history of being active, including lifting heavy items such as feed bags and large flower pots. She acknowledges that this may have contributed to her current condition. She also reports a recent incident of urinary incontinence, which she attributes to her inability to move quickly due to the pain.  Past Medical History:  Diagnosis Date   Chronic insomnia    Depression    Gastroesophageal reflux disease    Hyperlipidemia    Osteoporosis    Precordial chest pain 11/17/2016   Prediabetes     Past Surgical History:  Procedure Laterality Date   CHOLECYSTECTOMY  1999   CORONARY CALCIUM SCORE/CORONARY CTA  01/2017   Coronary calcium score is 0.  Normal coronary origin with normal right dominance.  No evidence of CAD.  Most likely noncardiac chest pain.   SHOULDER SURGERY  2005   TONSILLECTOMY     TUBAL LIGATION  1977    Family History  Problem Relation Age of Onset   Diabetes Mother     Hyperlipidemia Mother    Emphysema Father    Diabetes Maternal Grandmother    Liver cancer Maternal Grandmother     Social History   Socioeconomic History   Marital status: Married    Spouse name: Lyman Bishop   Number of children: 3   Years of education: 12   Highest education level: Associate degree: academic program  Occupational History   Occupation: Retired  Tobacco Use   Smoking status: Never   Smokeless tobacco: Never  Vaping Use   Vaping status: Never Used  Substance and Sexual Activity   Alcohol use: No   Drug use: No   Sexual activity: Not Currently  Other Topics Concern   Not on file  Social History Narrative   She is a married (remarried) mother of 3 with 4 grandchildren.  She has 3 great-grandchildren.  She previously worked at [employer] in the [employer].  She completed high school and beauty school.   She works outside a lot in the yard on gardening.  She does a lot of walking and exercises with walking for at least a half an hour a day 5 days a week.   wears sunscreen, brushes and flosses daily, see's dentist bi-annually, has smoke/carbon monoxide detectors, wears a seatbelt and practices gun safety   Social Determinants of Health   Financial Resource Strain: Low Risk  (05/07/2021)   Overall Financial  Resource Strain (CARDIA)    Difficulty of Paying Living Expenses: Not hard at all  Food Insecurity: No Food Insecurity (05/10/2022)   Hunger Vital Sign    Worried About Running Out of Food in the Last Year: Never true    Ran Out of Food in the Last Year: Never true  Transportation Needs: No Transportation Needs (05/10/2022)   PRAPARE - Administrator, Civil Service (Medical): No    Lack of Transportation (Non-Medical): No  Physical Activity: Inactive (08/23/2022)   Exercise Vital Sign    Days of Exercise per Week: 0 days    Minutes of Exercise per Session: 0 min  Stress: No Stress Concern Present (05/10/2022)   [employer] of Occupational  Health - Occupational Stress Questionnaire    Feeling of Stress : Only a little  Social Connections: Moderately Integrated (05/07/2021)   Social Connection and Isolation Panel [NHANES]    Frequency of Communication with Friends and Family: More than three times a week    Frequency of Social Gatherings with Friends and Family: Three times a week    Attends Religious Services: More than 4 times per year    Active Member of Clubs or Organizations: No    Attends Banker Meetings: Never    Marital Status: Married  Catering manager Violence: Not At Risk (05/10/2022)   Humiliation, Afraid, Rape, and Kick questionnaire    Fear of Current or Ex-Partner: No    Emotionally Abused: No    Physically Abused: No    Sexually Abused: No    Outpatient Medications Prior to Visit  Medication Sig Dispense Refill   acetaminophen (TYLENOL) 500 MG tablet Take 500 mg every 6 (six) hours as needed by mouth.     celecoxib (CELEBREX) 200 MG capsule Take 1 capsule (200 mg total) by mouth daily. 30 capsule 1   cholecalciferol (VITAMIN D3) 25 MCG (1000 UNIT) tablet Take 1,000 Units by mouth 2 (two) times daily.     doxycycline (VIBRA-TABS) 100 MG tablet Take 1 tablet (100 mg total) by mouth 2 (two) times daily. 14 tablet 0   famotidine (PEPCID) 20 MG tablet Take 1 tablet (20 mg total) by mouth 2 (two) times daily. 180 tablet 3   fluticasone (FLONASE) 50 MCG/ACT nasal spray SHAKE LIQUID AND USE 2 SPRAYS IN EACH NOSTRIL DAILY 16 g 6   HYDROcodone-acetaminophen (NORCO/VICODIN) 5-325 MG tablet Take 1 tablet by mouth every 6 (six) hours as needed for severe pain (pain score 7-10) (OSteoarthritis of lumbar spine.). 16 tablet 0   LORazepam (ATIVAN) 0.5 MG tablet Take 1 tablet (0.5 mg total) by mouth every 8 (eight) hours as needed for anxiety. 90 tablet 2   Multiple Vitamins-Minerals (PRESERVISION AREDS 2 PO) Take by mouth.     nabumetone (RELAFEN) 750 MG tablet TAKE 1 TABLET(750 MG) BY MOUTH DAILY 30 tablet 2    omeprazole (PRILOSEC) 20 MG capsule TAKE 1 CAPSULE(20 MG) BY MOUTH DAILY 90 capsule 1   rosuvastatin (CRESTOR) 20 MG tablet TAKE 1 TABLET(20 MG) BY MOUTH AT BEDTIME 90 tablet 0   venlafaxine XR (EFFEXOR-XR) 150 MG 24 hr capsule Take one tablet by mouth daily. 90 capsule 1   vitamin B-12 (CYANOCOBALAMIN) 1000 MCG tablet Take 1,000 mcg by mouth daily.     zolpidem (AMBIEN) 10 MG tablet TAKE 1 TABLET BY MOUTH AT BEDTIME 90 tablet 1   No facility-administered medications prior to visit.    Allergies  Allergen Reactions   Cephalexin Nausea And  Vomiting    ask   Alendronate     Gi upset   Biaxin [Clarithromycin] Other (See Comments)    CHEST PAIN   Ciprofloxacin     Severe reflux   Macrobid [Nitrofurantoin] Nausea And Vomiting    Stomach upset   Sulfa Antibiotics     Made her feel poorly.     Review of Systems  Constitutional:  Negative for chills, diaphoresis, fatigue and fever.  HENT:  Negative for congestion, ear pain and sinus pain.   Respiratory:  Negative for cough and shortness of breath.   Cardiovascular:  Negative for chest pain.  Gastrointestinal:  Negative for abdominal pain, constipation, nausea and vomiting.  Genitourinary:  Negative for dysuria.  Musculoskeletal:  Positive for arthralgias (bilateral hip pain) and back pain. Negative for neck pain.  Neurological:  Negative for weakness and headaches.  Psychiatric/Behavioral:  Negative for dysphoric mood. The patient is not nervous/anxious.        Objective:        11/22/2022    1:14 PM 11/03/2022   10:21 AM 10/18/2022    8:29 AM  Vitals with BMI  Height 5\' 6"  5\' 6"  5\' 6"   Weight 164 lbs 167 lbs 168 lbs  BMI 26.48 26.97 27.13  Systolic 142 132 161  Diastolic 90 78 80  Pulse 87 99 96    No data found.   Physical Exam Vitals reviewed.  Constitutional:      General: She is not in acute distress.    Appearance: She is ill-appearing.  HENT:     Head: Normocephalic.     Nose: Nose normal.  Eyes:      Conjunctiva/sclera: Conjunctivae normal.  Cardiovascular:     Rate and Rhythm: Normal rate and regular rhythm.     Heart sounds: Normal heart sounds.  Pulmonary:     Effort: Pulmonary effort is normal.     Breath sounds: Normal breath sounds. No wheezing.  Abdominal:     Palpations: Abdomen is soft.     Tenderness: There is no abdominal tenderness.  Musculoskeletal:     Cervical back: Normal range of motion.     Right hip: Decreased range of motion. Decreased strength.     Left hip: Decreased range of motion. Decreased strength.     Right lower leg: No edema.     Left lower leg: No edema.     Comments: Shuffling gait  Skin:    General: Skin is warm.  Neurological:     Mental Status: She is alert.     Gait: Gait abnormal.  Psychiatric:        Mood and Affect: Mood normal.        Behavior: Behavior normal.      Lab Results  Component Value Date   TSH 2.010 05/03/2022   Lab Results  Component Value Date   WBC 6.1 05/03/2022   HGB 14.7 05/03/2022   HCT 44.4 05/03/2022   MCV 89 05/03/2022   PLT 349 05/03/2022   Lab Results  Component Value Date   NA 140 08/23/2022   K 5.2 08/23/2022   CO2 26 08/23/2022   GLUCOSE 109 (H) 08/23/2022   BUN 8 08/23/2022   CREATININE 0.64 08/23/2022   BILITOT 0.4 08/23/2022   ALKPHOS 95 08/23/2022   AST 17 08/23/2022   ALT 18 08/23/2022   PROT 6.8 08/23/2022   ALBUMIN 4.6 08/23/2022   CALCIUM 9.6 08/23/2022   EGFR 93 08/23/2022   Lab Results  Component  Value Date   CHOL 147 10/19/2021   Lab Results  Component Value Date   HDL 61 10/19/2021   Lab Results  Component Value Date   LDLCALC 61 10/19/2021   Lab Results  Component Value Date   TRIG 147 10/19/2021   Lab Results  Component Value Date   CHOLHDL 2.4 10/19/2021   Lab Results  Component Value Date   HGBA1C 6.7 (H) 08/23/2022       Assessment & Plan:  Bilateral hip pain Assessment & Plan: Acute Bilateral hip pain, worse than back pain. No imaging since  injury. -Consider imaging to assess for any new injury. -Consider consultation for possible steroid injection in hips.  Orders: -     DG Arthro Hip Left; Future -     DG Arthro Hip Right; Future  Idiopathic peripheral neuropathy Assessment & Plan: Controlled Continue current medication regimen. Gabapentin 100 mg by mouth THREE TIMES A DAY   Orders: -     Gabapentin; Take 1 capsule (100 mg total) by mouth 3 (three) times daily.  Dispense: 90 capsule; Refill: 3  Vertebral fracture, osteoporotic, sequela Assessment & Plan: Pain managed with back brace and TENS unit. Overexertion led to increased pain. -Continue use of back brace and TENS unit as tolerated. -Avoid strenuous activities such as vacuuming and mopping.       Meds ordered this encounter  Medications   gabapentin (NEURONTIN) 100 MG capsule    Sig: Take 1 capsule (100 mg total) by mouth 3 (three) times daily.    Dispense:  90 capsule    Refill:  3    Orders Placed This Encounter  Procedures   DG Arthro Hip Left   DG Arthro Hip Right     Follow-up: Return in about 9 days (around 12/01/2022) for nurse visit.  An After Visit Summary was printed and given to the patient.  Total time spent on today's visit was greater than 40 minutes, including both face-to-face time and nonface-to-face time personally spent on review of chart (labs and imaging), discussing labs and goals, discussing further work-up, treatment options, referrals to specialist if needed, reviewing outside records if pertinent, answering patient's questions, and coordinating care.   Lajuana Matte, FNP [facility] [phone]

## 2022-11-22 NOTE — Assessment & Plan Note (Signed)
Pain managed with back brace and TENS unit. Overexertion led to increased pain. -Continue use of back brace and TENS unit as tolerated. -Avoid strenuous activities such as vacuuming and mopping.

## 2022-11-22 NOTE — Assessment & Plan Note (Addendum)
Acute Bilateral hip pain, worse than back pain. No imaging since injury. -Consider imaging to assess for any new injury. -Consider consultation for possible steroid injection in hips. -offered patient a prednisone taper and she declined - said it makes her irritable/mean

## 2022-11-22 NOTE — Telephone Encounter (Signed)
Copied from CRM [id number]. Topic: Clinical - Red Word Triage >> Nov 22, 2022  8:35 AM Virginia Montoya wrote: Red Word that prompted transfer to Nurse Triage: Pt has broken vertebrae and believes she has done something to her hips. Says the pain started Saturday after cleaning.    Chief Complaint:  Patient reports her  hip back and front of her hip. Patient states mopping and vacuuming on Saturday and right after that the pain started. Patient stated she had her Tens unit. Symptoms: Pain in top of legs at the front. All pain started on Saturday. Frequency: Pain  is all the time . Rates pain as an 8  .t feels like a burning type pain. It feels like  it is burning like fire. Pertinent Negatives: Patient denies falls. Disposition: [] ED /[] Urgent Care (no appt availability in office) / [] Appointment(In office/virtual)/ []  [facility]/ [] Home Care/ [] Refused Recommended Disposition /[] [facility]/ []  Follow-up with PCP Additional Notes: Patient has pain medication of hydrocodone . Also patient is seen by a spine specialist. RN attempted to divert patient to spine specialist but she insist on seeing Provider [facility]. Reason for Disposition  [1] SEVERE pain (e.g., excruciating, unable to do any normal activities) AND [2] not improved after 2 hours of pain medicine  Answer Assessment - Initial Assessment Questions 1. LOCATION and RADIATION: "Where is the pain located?" Patient reports her  hip back and top front of her hip.  And around her waist to her back bone.      2. QUALITY: "What does the pain feel like?"   Patient reports  burning 3. SEVERITY: "How bad is the pain?" "What does it keep you from doing?"   (Scale 1-10;  Rates pains as 8 on scale      -  SEVERE (8-10): excruciating pain, unable to do any normal activities, unable to walk      4. ONSET: "When did the pain start?" "Does it come and go, or is it there all the time?" Saturday after cleaning with mopping and vacuuming      5.  WORK OR EXERCISE: "Has there been any recent work or exercise that involved this part of the body?"  She sees the spine specialist . This is the first time she is having this time . Patient  had a  fractured vertebrae.      6. CAUSE: "What do you think is causing the hip pain?"      Hip pain-  Mopping and vacuuming 7. AGGRAVATING FACTORS: "What makes the hip pain worse?" (e.g., walking, climbing stairs, running) sitting   down and getting up      8. OTHER SYMPTOMS: "Do you have any other symptoms?" (e.g., back pain, pain shooting down leg,  fever, rash) back pain  Protocols used: Hip Pain-A-AH

## 2022-11-22 NOTE — Patient Instructions (Signed)
Managing Pain Without Opioids Opioids are strong medicines used to treat moderate to severe pain. For some people, especially those who have long-term (chronic) pain, opioids may not be the best choice for pain management due to: Side effects like nausea, constipation, and sleepiness. The risk of addiction (opioid use disorder). The longer you take opioids, the greater your risk of addiction. Pain that lasts for more than 3 months is called chronic pain. Managing chronic pain usually requires more than one approach and is often provided by a team of health care providers working together (multidisciplinary approach). Pain management may be done at a pain management center or pain clinic. How to manage pain without the use of opioids Use non-opioid medicines Non-opioid medicines for pain may include: Over-the-counter or prescription non-steroidal anti-inflammatory drugs (NSAIDs). These may be the first medicines used for pain. They work well for muscle and bone pain, and they reduce swelling. Acetaminophen. This over-the-counter medicine may work well for milder pain but not swelling. Antidepressants. These may be used to treat chronic pain. A certain type of antidepressant (tricyclics) is often used. These medicines are given in lower doses for pain than when used for depression. Anticonvulsants. These are usually used to treat seizures but may also reduce nerve (neuropathic) pain. Muscle relaxants. These relieve pain caused by sudden muscle tightening (spasms). You may also use a pain medicine that is applied to the skin as a patch, cream, or gel (topical analgesic), such as a numbing medicine. These may cause fewer side effects than medicines taken by mouth. Do certain therapies as directed Some therapies can help with pain management. They include: Physical therapy. You will do exercises to gain strength and flexibility. A physical therapist may teach you exercises to move and stretch parts of  your body that are weak, stiff, or painful. You can learn these exercises at physical therapy visits and practice them at home. Physical therapy may also involve: Massage. Heat wraps or applying heat or cold to affected areas. Electrical signals that interrupt pain signals (transcutaneous electrical nerve stimulation, TENS). Weak lasers that reduce pain and swelling (low-level laser therapy). Signals from your body that help you learn to regulate pain (biofeedback). Occupational therapy. This helps you to learn ways to function at home and work with less pain. Recreational therapy. This involves trying new activities or hobbies, such as a physical activity or drawing. Mental health therapy, including: Cognitive behavioral therapy (CBT). This helps you learn coping skills for dealing with pain. Acceptance and commitment therapy (ACT) to change the way you think and react to pain. Relaxation therapies, including muscle relaxation exercises and mindfulness-based stress reduction. Pain management counseling. This may be individual, family, or group counseling.  Receive medical treatments Medical treatments for pain management include: Nerve block injections. These may include a pain blocker and anti-inflammatory medicines. You may have injections: Near the spine to relieve chronic back or neck pain. Into joints to relieve back or joint pain. Into nerve areas that supply a painful area to relieve body pain. Into muscles (trigger point injections) to relieve some painful muscle conditions. A medical device placed near your spine to help block pain signals and relieve nerve pain or chronic back pain (spinal cord stimulation device). Acupuncture. Follow these instructions at home Medicines Take over-the-counter and prescription medicines only as told by your health care provider. If you are taking pain medicine, ask your health care providers about possible side effects to watch out for. Do not  drive or use heavy machinery  while taking prescription opioid pain medicine. Lifestyle  Do not use drugs or alcohol to reduce pain. If you drink alcohol, limit how much you have to: 0-1 drink a day for women who are not pregnant. 0-2 drinks a day for men. Know how much alcohol is in a drink. In the [place] one drink equals one 12 oz bottle of beer (355 mL), one 5 oz glass of wine (148 mL), or one 1 oz glass of hard liquor (44 mL). Do not use any products that contain nicotine or tobacco. These products include cigarettes, chewing tobacco, and vaping devices, such as e-cigarettes. If you need help quitting, ask your health care provider. Eat a healthy diet and maintain a healthy weight. Poor diet and excess weight may make pain worse. Eat foods that are high in fiber. These include fresh fruits and vegetables, whole grains, and beans. Limit foods that are high in fat and processed sugars, such as fried and sweet foods. Exercise regularly. Exercise lowers stress and may help relieve pain. Ask your health care provider what activities and exercises are safe for you. If your health care provider approves, join an exercise class that combines movement and stress reduction. Examples include yoga and tai chi. Get enough sleep. Lack of sleep may make pain worse. Lower stress as much as possible. Practice stress reduction techniques as told by your therapist. General instructions Work with all your pain management providers to find the treatments that work best for you. You are an important member of your pain management team. There are many things you can do to reduce pain on your own. Consider joining an online or in-person support group for people who have chronic pain. Keep all follow-up visits. This is important. Where to find more information You can find more information about managing pain without opioids from: American Academy of Pain Medicine: painmed.org Institute for Chronic Pain:  instituteforchronicpain.org American Chronic Pain Association: theacpa.org Contact a health care provider if: You have side effects from pain medicine. Your pain gets worse or does not get better with treatments or home therapy. You are struggling with anxiety or depression. Summary Many types of pain can be managed without opioids. Chronic pain may respond better to pain management without opioids. Pain is best managed when you and a team of health care providers work together. Pain management without opioids may include non-opioid medicines, medical treatments, physical therapy, mental health therapy, and lifestyle changes. Tell your health care providers if your pain gets worse or is not being managed well enough. This information is not intended to replace advice given to you by your health care provider. Make sure you discuss any questions you have with your health care provider. Document Revised: 04/02/2020 Document Reviewed: 04/02/2020 Elsevier Patient Education  2024 [employer].

## 2022-11-22 NOTE — Assessment & Plan Note (Signed)
Controlled Continue current medication regimen. Gabapentin 100 mg by mouth THREE TIMES A DAY

## 2022-11-30 ENCOUNTER — Other Ambulatory Visit: Payer: Self-pay | Admitting: Family Medicine

## 2022-11-30 DIAGNOSIS — Z6827 Body mass index (BMI) 27.0-27.9, adult: Secondary | ICD-10-CM | POA: Diagnosis not present

## 2022-11-30 DIAGNOSIS — S22080A Wedge compression fracture of T11-T12 vertebra, initial encounter for closed fracture: Secondary | ICD-10-CM | POA: Diagnosis not present

## 2022-11-30 DIAGNOSIS — M47816 Spondylosis without myelopathy or radiculopathy, lumbar region: Secondary | ICD-10-CM | POA: Diagnosis not present

## 2022-11-30 DIAGNOSIS — F411 Generalized anxiety disorder: Secondary | ICD-10-CM

## 2022-12-01 ENCOUNTER — Ambulatory Visit: Payer: Medicare HMO

## 2022-12-01 DIAGNOSIS — M25552 Pain in left hip: Secondary | ICD-10-CM | POA: Diagnosis not present

## 2022-12-01 DIAGNOSIS — M25551 Pain in right hip: Secondary | ICD-10-CM

## 2022-12-01 MED ORDER — KETOROLAC TROMETHAMINE 60 MG/2ML IM SOLN
60.0000 mg | Freq: Once | INTRAMUSCULAR | Status: AC
  Administered 2022-12-01: 60 mg via INTRAMUSCULAR

## 2022-12-01 NOTE — Progress Notes (Signed)
Patient came in requesting Toradol Shot, and consulted with Dr. Sedalia Muta and per Dr. Sedalia Muta patient can have a toradol shot today but the patient will need to come in for a visit if her pain still persists or believes she needs another injection. Patient was informed.

## 2022-12-08 DIAGNOSIS — M25551 Pain in right hip: Secondary | ICD-10-CM | POA: Diagnosis not present

## 2022-12-08 DIAGNOSIS — M25552 Pain in left hip: Secondary | ICD-10-CM | POA: Diagnosis not present

## 2022-12-09 ENCOUNTER — Encounter: Payer: Self-pay | Admitting: Family Medicine

## 2022-12-09 DIAGNOSIS — M160 Bilateral primary osteoarthritis of hip: Secondary | ICD-10-CM | POA: Diagnosis not present

## 2022-12-13 ENCOUNTER — Telehealth: Payer: Self-pay

## 2022-12-13 DIAGNOSIS — M160 Bilateral primary osteoarthritis of hip: Secondary | ICD-10-CM

## 2022-12-13 NOTE — Telephone Encounter (Signed)
Copied from CRM [id number]. Topic: Clinical - Lab/Test Results >> Dec 13, 2022 12:57 PM Theodis Sato wrote: Reason for CRM: PT has a question about her X-RAY results and would like to speak to Lajuana Matte or her nurse.

## 2022-12-14 DIAGNOSIS — M5387 Other specified dorsopathies, lumbosacral region: Secondary | ICD-10-CM | POA: Diagnosis not present

## 2022-12-14 DIAGNOSIS — M9905 Segmental and somatic dysfunction of pelvic region: Secondary | ICD-10-CM | POA: Diagnosis not present

## 2022-12-14 DIAGNOSIS — M9902 Segmental and somatic dysfunction of thoracic region: Secondary | ICD-10-CM | POA: Diagnosis not present

## 2022-12-14 DIAGNOSIS — M7918 Myalgia, other site: Secondary | ICD-10-CM | POA: Diagnosis not present

## 2022-12-14 DIAGNOSIS — M9903 Segmental and somatic dysfunction of lumbar region: Secondary | ICD-10-CM | POA: Diagnosis not present

## 2022-12-14 DIAGNOSIS — M461 Sacroiliitis, not elsewhere classified: Secondary | ICD-10-CM | POA: Diagnosis not present

## 2022-12-22 DIAGNOSIS — M4854XA Collapsed vertebra, not elsewhere classified, thoracic region, initial encounter for fracture: Secondary | ICD-10-CM | POA: Diagnosis not present

## 2022-12-22 DIAGNOSIS — R29890 Loss of height: Secondary | ICD-10-CM | POA: Diagnosis not present

## 2022-12-22 DIAGNOSIS — M5124 Other intervertebral disc displacement, thoracic region: Secondary | ICD-10-CM | POA: Diagnosis not present

## 2022-12-22 DIAGNOSIS — M47814 Spondylosis without myelopathy or radiculopathy, thoracic region: Secondary | ICD-10-CM | POA: Diagnosis not present

## 2022-12-22 NOTE — Progress Notes (Signed)
Cancelled.  

## 2022-12-23 ENCOUNTER — Telehealth: Payer: Self-pay

## 2022-12-23 ENCOUNTER — Ambulatory Visit: Payer: Medicare HMO | Admitting: Family Medicine

## 2022-12-23 DIAGNOSIS — H353221 Exudative age-related macular degeneration, left eye, with active choroidal neovascularization: Secondary | ICD-10-CM | POA: Diagnosis not present

## 2022-12-23 NOTE — Telephone Encounter (Signed)
Copied from CRM [id number]. Topic: General - Call Back - No Documentation >> Dec 23, 2022  3:10 PM Archie Patten S wrote: Reason for CRM: Patient is returning a call from [facility] regarding her appt tomorrow with Dr Sedalia Muta.

## 2022-12-24 ENCOUNTER — Encounter (INDEPENDENT_AMBULATORY_CARE_PROVIDER_SITE_OTHER): Payer: Medicare HMO | Admitting: Family Medicine

## 2022-12-24 ENCOUNTER — Ambulatory Visit: Payer: Medicare HMO | Admitting: Family Medicine

## 2022-12-24 ENCOUNTER — Encounter: Payer: Self-pay | Admitting: Family Medicine

## 2022-12-24 VITALS — BP 132/82 | HR 88 | Temp 97.6°F | Ht 66.0 in | Wt 170.0 lb

## 2022-12-24 DIAGNOSIS — E782 Mixed hyperlipidemia: Secondary | ICD-10-CM

## 2022-12-24 DIAGNOSIS — K219 Gastro-esophageal reflux disease without esophagitis: Secondary | ICD-10-CM

## 2022-12-24 DIAGNOSIS — M25551 Pain in right hip: Secondary | ICD-10-CM | POA: Diagnosis not present

## 2022-12-24 DIAGNOSIS — F411 Generalized anxiety disorder: Secondary | ICD-10-CM | POA: Diagnosis not present

## 2022-12-24 DIAGNOSIS — M810 Age-related osteoporosis without current pathological fracture: Secondary | ICD-10-CM | POA: Diagnosis not present

## 2022-12-24 DIAGNOSIS — I152 Hypertension secondary to endocrine disorders: Secondary | ICD-10-CM | POA: Diagnosis not present

## 2022-12-24 DIAGNOSIS — E1159 Type 2 diabetes mellitus with other circulatory complications: Secondary | ICD-10-CM

## 2022-12-24 DIAGNOSIS — M8008XS Age-related osteoporosis with current pathological fracture, vertebra(e), sequela: Secondary | ICD-10-CM

## 2022-12-24 DIAGNOSIS — G609 Hereditary and idiopathic neuropathy, unspecified: Secondary | ICD-10-CM

## 2022-12-24 DIAGNOSIS — R7303 Prediabetes: Secondary | ICD-10-CM

## 2022-12-24 DIAGNOSIS — M25552 Pain in left hip: Secondary | ICD-10-CM | POA: Diagnosis not present

## 2022-12-24 MED ORDER — DICLOFENAC SODIUM 1 % EX GEL: CUTANEOUS | 1 refills | Status: DC

## 2022-12-24 MED ORDER — DENOSUMAB 60 MG/ML ~~LOC~~ SOSY
60.0000 mg | PREFILLED_SYRINGE | SUBCUTANEOUS | Status: AC
  Administered 2022-12-24: 60 mg via SUBCUTANEOUS

## 2022-12-24 NOTE — Assessment & Plan Note (Signed)
The current medical regimen is effective;  continue present plan and medications. Continue omeprazole. ?

## 2022-12-24 NOTE — Assessment & Plan Note (Signed)
Well controlled.  No changes to medicines. Continue crestor 20 mg before bed.  Continue to work on eating a healthy diet and exercise.  Labs drawn today.   

## 2022-12-24 NOTE — Progress Notes (Unsigned)
Subjective:  Patient ID: Virginia Montoya, female    DOB: Nov 18, 1950  Age: 72 y.o. MRN: [id number]  Chief Complaint  Patient presents with  . Medical Management of Chronic Issues    HPI   Osteoporosis. Prolia 60 mg every 6 months, Vitamin D 1000 U qd. Intolerant to calcium due to constipation.     GAD/Depression: on effexor xr 150 mg one daily in am. Lorazepam 0.5 mg 1 pill as needed during the day usually around 2 pm.  Patient takes Lorazepam 1/2 pill in evening. Takes ambien at night for sleep.    Diabetes: Diagnosed in 08/2022. A1C 6.9. Limiting sugars. Not able to walk for exercise. Checking your feet daily.   GERD. On omeprazole 20 mg daily. Well-controlled.    Macular degeneration: sees eye doctor.    Hyperlipidemia: on crestor 20 mg before bed.  Eats fairly healthy and exercising.  Continued back and hip pain. Seeing [facility] for your spine. Had an MRI lumbar spine 2 days ago, but the machine broke part way through.   Hip pain: scheduled to see [facility] on Christms eve.        12/24/2022   10:12 AM 08/23/2022    9:49 AM 05/10/2022    3:34 PM 05/03/2022    9:55 AM 01/27/2022    9:25 AM  Depression screen PHQ 2/9  Decreased Interest 0 0 0 0 0  Down, Depressed, Hopeless 0 0 0 0 1  PHQ - 2 Score 0 0 0 0 1  Altered sleeping 0 2 0 1 1  Tired, decreased energy 0 0 0 0 0  Change in appetite 0 2 0 0 0  Feeling bad or failure about yourself  0 0 0 0 0  Trouble concentrating 0 0 0 0 0  Moving slowly or fidgety/restless 0 0 0 0 0  Suicidal thoughts 0 0 0 0 0  PHQ-9 Score 0 4 0 1 2  Difficult doing work/chores Not difficult at all Not difficult at all Not difficult at all Not difficult at all Not difficult at all        08/23/2022    9:49 AM  Fall Risk   Falls in the past year? 0  Number falls in past yr: 0  Injury with Fall? 0  Risk for fall due to : No Fall Risks  Follow up Falls evaluation completed    Patient Care Team: Blane Ohara, MD as  PCP - General (Family Medicine)   Review of Systems  Constitutional:  Negative for chills, fatigue and fever.  HENT:  Negative for congestion, ear pain, postnasal drip, rhinorrhea, sinus pressure, sinus pain and sore throat.   Respiratory:  Negative for cough and shortness of breath.   Cardiovascular:  Negative for chest pain.  Gastrointestinal:  Positive for constipation (secondary to medications. Taking miralax. Used MOM and that helped. Last BM was 2 days ago.). Negative for diarrhea and nausea.  Neurological:  Negative for dizziness and headaches.    Current Outpatient Medications on File Prior to Visit  Medication Sig Dispense Refill  . acetaminophen (TYLENOL) 500 MG tablet Take 500 mg every 6 (six) hours as needed by mouth.    . celecoxib (CELEBREX) 200 MG capsule Take 1 capsule (200 mg total) by mouth daily. 30 capsule 1  . cholecalciferol (VITAMIN D3) 25 MCG (1000 UNIT) tablet Take 1,000 Units by mouth 2 (two) times daily.    Marland Kitchen doxycycline (VIBRA-TABS) 100 MG tablet Take 1 tablet (100 mg  total) by mouth 2 (two) times daily. 14 tablet 0  . famotidine (PEPCID) 20 MG tablet Take 1 tablet (20 mg total) by mouth 2 (two) times daily. 180 tablet 3  . fluticasone (FLONASE) 50 MCG/ACT nasal spray SHAKE LIQUID AND USE 2 SPRAYS IN EACH NOSTRIL DAILY 16 g 6  . gabapentin (NEURONTIN) 100 MG capsule Take 1 capsule (100 mg total) by mouth 3 (three) times daily. 90 capsule 3  . LORazepam (ATIVAN) 0.5 MG tablet Take 1 tablet (0.5 mg total) by mouth every 8 (eight) hours as needed for anxiety. 90 tablet 2  . methocarbamol (ROBAXIN) 500 MG tablet Take 500 mg by mouth 4 (four) times daily.    . Multiple Vitamins-Minerals (PRESERVISION AREDS 2 PO) Take by mouth.    Marland Kitchen omeprazole (PRILOSEC) 20 MG capsule TAKE 1 CAPSULE(20 MG) BY MOUTH DAILY 90 capsule 1  . oxyCODONE-acetaminophen (PERCOCET/ROXICET) 5-325 MG tablet Take 1 tablet by mouth every 6 (six) hours as needed.    Marland Kitchen PROLIA 60 MG/ML SOSY injection  Inject into the skin.    . rosuvastatin (CRESTOR) 20 MG tablet TAKE 1 TABLET(20 MG) BY MOUTH AT BEDTIME 90 tablet 0  . venlafaxine XR (EFFEXOR-XR) 150 MG 24 hr capsule TAKE 1 CAPSULE BY MOUTH DAILY 90 capsule 1  . vitamin B-12 (CYANOCOBALAMIN) 1000 MCG tablet Take 1,000 mcg by mouth daily.    Marland Kitchen zolpidem (AMBIEN) 10 MG tablet TAKE 1 TABLET BY MOUTH AT BEDTIME 90 tablet 1   No current facility-administered medications on file prior to visit.   Past Medical History:  Diagnosis Date  . Chronic insomnia   . Depression   . Gastroesophageal reflux disease   . Hyperlipidemia   . Osteoporosis   . Precordial chest pain 11/17/2016  . Prediabetes    Past Surgical History:  Procedure Laterality Date  . CHOLECYSTECTOMY  1999  . CORONARY CALCIUM SCORE/CORONARY CTA  01/2017   Coronary calcium score is 0.  Normal coronary origin with normal right dominance.  No evidence of CAD.  Most likely noncardiac chest pain.  Marland Kitchen SHOULDER SURGERY  2005  . TONSILLECTOMY    . TUBAL LIGATION  1977    Family History  Problem Relation Age of Onset  . Diabetes Mother   . Hyperlipidemia Mother   . Emphysema Father   . Diabetes Maternal Grandmother   . Liver cancer Maternal Grandmother    Social History   Socioeconomic History  . Marital status: Married    Spouse name: Lyman Bishop  . Number of children: 3  . Years of education: 59  . Highest education level: Associate degree: academic program  Occupational History  . Occupation: Retired  Tobacco Use  . Smoking status: Never  . Smokeless tobacco: Never  Vaping Use  . Vaping status: Never Used  Substance and Sexual Activity  . Alcohol use: No  . Drug use: No  . Sexual activity: Not Currently  Other Topics Concern  . Not on file  Social History Narrative   She is a married (remarried) mother of 3 with 4 grandchildren.  She has 3 great-grandchildren.  She previously worked at [employer] in the [employer].  She completed high school and beauty school.    She works outside a lot in the yard on gardening.  She does a lot of walking and exercises with walking for at least a half an hour a day 5 days a week.   wears sunscreen, brushes and flosses daily, see's dentist bi-annually, has smoke/carbon monoxide detectors,  wears a seatbelt and practices gun safety   Social Drivers of Health   Financial Resource Strain: Low Risk  (05/07/2021)   Overall Financial Resource Strain (CARDIA)   . Difficulty of Paying Living Expenses: Not hard at all  Food Insecurity: No Food Insecurity (05/10/2022)   Hunger Vital Sign   . Worried About Programme researcher, broadcasting/film/video in the Last Year: Never true   . Ran Out of Food in the Last Year: Never true  Transportation Needs: No Transportation Needs (05/10/2022)   PRAPARE - Transportation   . Lack of Transportation (Medical): No   . Lack of Transportation (Non-Medical): No  Physical Activity: Inactive (08/23/2022)   Exercise Vital Sign   . Days of Exercise per Week: 0 days   . Minutes of Exercise per Session: 0 min  Stress: No Stress Concern Present (05/10/2022)   [employer] of Occupational Health - Occupational Stress Questionnaire   . Feeling of Stress : Only a little  Social Connections: Moderately Integrated (05/07/2021)   Social Connection and Isolation Panel [NHANES]   . Frequency of Communication with Friends and Family: More than three times a week   . Frequency of Social Gatherings with Friends and Family: Three times a week   . Attends Religious Services: More than 4 times per year   . Active Member of Clubs or Organizations: No   . Attends Banker Meetings: Never   . Marital Status: Married    Objective:  BP 132/82   Pulse 88   Temp 97.6 F (36.4 C)   Ht 5\' 6"  (1.676 m)   Wt 170 lb (77.1 kg)   LMP  (LMP Unknown)   SpO2 98%   BMI 27.44 kg/m      12/24/2022   10:08 AM 11/22/2022    1:14 PM 11/03/2022   10:21 AM  BP/Weight  Systolic BP 132 142 132  Diastolic BP 82 90 78  Wt. (Lbs) 170  164 167  BMI 27.44 kg/m2 26.47 kg/m2 26.95 kg/m2    Physical Exam Vitals reviewed.  Constitutional:      Appearance: Normal appearance. She is normal weight.  Neck:     Vascular: No carotid bruit.  Cardiovascular:     Rate and Rhythm: Normal rate and regular rhythm.     Heart sounds: Normal heart sounds.  Pulmonary:     Effort: Pulmonary effort is normal. No respiratory distress.     Breath sounds: Normal breath sounds.  Abdominal:     General: Abdomen is flat. Bowel sounds are normal.     Palpations: Abdomen is soft.     Tenderness: There is no abdominal tenderness.  Musculoskeletal:     Comments: Lumbar brace  Neurological:     Mental Status: She is alert and oriented to person, place, and time.  Psychiatric:        Mood and Affect: Mood normal.        Behavior: Behavior normal.    Diabetic Foot Exam - Simple   Simple Foot Form  12/24/2022  8:53 PM  Visual Inspection No deformities, no ulcerations, no other skin breakdown bilaterally: Yes Sensation Testing Intact to touch and monofilament testing bilaterally: Yes Pulse Check Posterior Tibialis and Dorsalis pulse intact bilaterally: Yes Comments      Lab Results  Component Value Date   WBC 7.6 12/24/2022   HGB 14.2 12/24/2022   HCT 43.5 12/24/2022   PLT 371 12/24/2022   GLUCOSE 105 (H) 12/24/2022   CHOL 169 12/24/2022  TRIG 179 (H) 12/24/2022   HDL 53 12/24/2022   LDLCALC 86 12/24/2022   ALT 16 12/24/2022   AST 24 12/24/2022   NA 143 12/24/2022   K 5.3 (H) 12/24/2022   CL 102 12/24/2022   CREATININE 0.66 12/24/2022   BUN 10 12/24/2022   CO2 23 12/24/2022   TSH 2.010 05/03/2022   HGBA1C 6.4 (H) 12/24/2022   MICROALBUR 10 05/29/2020      Assessment & Plan:    Mixed hyperlipidemia Assessment & Plan: Well controlled.  No changes to medicines. Continue crestor 20 mg before bed.  Continue to work on eating a healthy diet and exercise.  Labs drawn today.    Orders: -     Comprehensive  metabolic panel -     Lipid panel  Prediabetes Assessment & Plan: Recommend continue to work on eating healthy diet and exercise.   Orders: -     CBC with Differential/Platelet -     Hemoglobin A1c  GAD (generalized anxiety disorder) Assessment & Plan: Continue on effexor xr 150 mg one daily in am. Lorazepam 0.5 mg 1 pill as needed during the day and patient takes 1 pill in evening. Takes ambien at night for sleep.     Gastroesophageal reflux disease without esophagitis Assessment & Plan: The current medical regimen is effective;  continue present plan and medications. Continue omeprazole.   Age-related osteoporosis without current pathological fracture Assessment & Plan: Continue Prolia 60 mg every 6 months, Vitamin D 1000 U q  Orders: -     Denosumab  Hypertension associated with type 2 diabetes mellitus (HCC) -     Microalbumin / creatinine urine ratio  Other orders -     Diclofenac Sodium; 4 gms four times a day as needed hip pain  Dispense: 350 g; Refill: 1     Meds ordered this encounter  Medications  . denosumab (PROLIA) injection 60 mg    Patient is enrolled in [facility] program for this medication and I have provided a copy of the Prolia Medication Guide and Patient Brochure.:   No    I have reviewed with the patient the information in the Prolia Medication Guide and Patient Counseling Chart including the serious risks of Prolia and symptoms of each risk.:   Yes    I have advised the patient to seek medical attention if they have signs or symptoms of any of the serious risks.:   Yes  . diclofenac Sodium (VOLTAREN) 1 % GEL    Sig: 4 gms four times a day as needed hip pain    Dispense:  350 g    Refill:  1    Orders Placed This Encounter  Procedures  . CBC with Differential/Platelet  . Comprehensive metabolic panel  . Lipid panel  . Hemoglobin A1c  . Microalbumin / creatinine urine ratio     Follow-up: Return in about 3 months (around 03/24/2023) for  chronic follow up.   I,Katherina A Bramblett,acting as a scribe for Blane Ohara, MD.,have documented all relevant documentation on the behalf of Blane Ohara, MD,as directed by  Blane Ohara, MD while in the presence of Blane Ohara, MD.   An After Visit Summary was printed and given to the patient.  Blane Ohara, MD [facility] [phone]

## 2022-12-24 NOTE — Assessment & Plan Note (Signed)
Recommend continue to work on eating healthy diet and exercise.  

## 2022-12-24 NOTE — Assessment & Plan Note (Signed)
Continue on effexor xr 150 mg one daily in am. Lorazepam 0.5 mg 1 pill as needed during the day and patient takes 1 pill in evening. Takes ambien at night for sleep.

## 2022-12-24 NOTE — Assessment & Plan Note (Signed)
Continue Prolia 60 mg every 6 months, Vitamin D 1000 U q

## 2022-12-25 ENCOUNTER — Other Ambulatory Visit: Payer: Self-pay | Admitting: Physician Assistant

## 2022-12-25 DIAGNOSIS — N3000 Acute cystitis without hematuria: Secondary | ICD-10-CM

## 2022-12-25 LAB — COMPREHENSIVE METABOLIC PANEL
ALT: 16 [IU]/L (ref 0–32)
AST: 24 [IU]/L (ref 0–40)
Albumin: 4.5 g/dL (ref 3.8–4.8)
Alkaline Phosphatase: 125 [IU]/L — ABNORMAL HIGH (ref 44–121)
BUN/Creatinine Ratio: 15 (ref 12–28)
BUN: 10 mg/dL (ref 8–27)
Bilirubin Total: 0.4 mg/dL (ref 0.0–1.2)
CO2: 23 mmol/L (ref 20–29)
Calcium: 9.7 mg/dL (ref 8.7–10.3)
Chloride: 102 mmol/L (ref 96–106)
Creatinine, Ser: 0.66 mg/dL (ref 0.57–1.00)
Globulin, Total: 2.4 g/dL (ref 1.5–4.5)
Glucose: 105 mg/dL — ABNORMAL HIGH (ref 70–99)
Potassium: 5.3 mmol/L — ABNORMAL HIGH (ref 3.5–5.2)
Sodium: 143 mmol/L (ref 134–144)
Total Protein: 6.9 g/dL (ref 6.0–8.5)
eGFR: 92 mL/min/{1.73_m2} (ref >59)

## 2022-12-25 LAB — LIPID PANEL
Chol/HDL Ratio: 3.2 {ratio} (ref 0.0–4.4)
Cholesterol, Total: 169 mg/dL (ref 100–199)
HDL: 53 mg/dL (ref >39)
LDL Chol Calc (NIH): 86 mg/dL (ref 0–99)
Triglycerides: 179 mg/dL — ABNORMAL HIGH (ref 0–149)
VLDL Cholesterol Cal: 30 mg/dL (ref 5–40)

## 2022-12-25 LAB — CBC WITH DIFFERENTIAL/PLATELET
Basophils Absolute: 0 10*3/uL (ref 0.0–0.2)
Basos: 1 %
EOS (ABSOLUTE): 0.1 10*3/uL (ref 0.0–0.4)
Eos: 2 %
Hematocrit: 43.5 % (ref 34.0–46.6)
Hemoglobin: 14.2 g/dL (ref 11.1–15.9)
Immature Grans (Abs): 0 10*3/uL (ref 0.0–0.1)
Immature Granulocytes: 0 %
Lymphocytes Absolute: 2.1 10*3/uL (ref 0.7–3.1)
Lymphs: 28 %
MCH: 28.5 pg (ref 26.6–33.0)
MCHC: 32.6 g/dL (ref 31.5–35.7)
MCV: 87 fL (ref 79–97)
Monocytes Absolute: 0.5 10*3/uL (ref 0.1–0.9)
Monocytes: 7 %
Neutrophils Absolute: 4.8 10*3/uL (ref 1.4–7.0)
Neutrophils: 62 %
Platelets: 371 10*3/uL (ref 150–450)
RBC: 4.98 x10E6/uL (ref 3.77–5.28)
RDW: 12.6 % (ref 11.7–15.4)
WBC: 7.6 10*3/uL (ref 3.4–10.8)

## 2022-12-25 LAB — HEMOGLOBIN A1C
Est. average glucose Bld gHb Est-mCnc: 137 mg/dL
Hgb A1c MFr Bld: 6.4 % — ABNORMAL HIGH (ref 4.8–5.6)

## 2022-12-25 LAB — MICROALBUMIN / CREATININE URINE RATIO
Creatinine, Urine: 39.5 mg/dL
Microalb/Creat Ratio: 19 mg/g{creat} (ref 0–29)
Microalbumin, Urine: 7.5 ug/mL

## 2022-12-25 MED ORDER — LEVOFLOXACIN 500 MG PO TABS: 500.0000 mg | ORAL_TABLET | Freq: Every day | ORAL | 0 refills | Status: DC

## 2022-12-26 DIAGNOSIS — I152 Hypertension secondary to endocrine disorders: Secondary | ICD-10-CM | POA: Insufficient documentation

## 2022-12-26 NOTE — Assessment & Plan Note (Signed)
Management per specialist.  Continue brace.

## 2022-12-26 NOTE — Assessment & Plan Note (Signed)
Keep appt with  orthopedics.

## 2022-12-26 NOTE — Assessment & Plan Note (Signed)
New diagnosis.  Check A1c.  Recommend continue to work on eating healthy diet and exercise.

## 2022-12-28 ENCOUNTER — Other Ambulatory Visit: Payer: Self-pay | Admitting: Family Medicine

## 2022-12-28 DIAGNOSIS — S32010A Wedge compression fracture of first lumbar vertebra, initial encounter for closed fracture: Secondary | ICD-10-CM | POA: Diagnosis not present

## 2022-12-28 DIAGNOSIS — S22080A Wedge compression fracture of T11-T12 vertebra, initial encounter for closed fracture: Secondary | ICD-10-CM | POA: Diagnosis not present

## 2022-12-30 ENCOUNTER — Ambulatory Visit: Payer: Medicare HMO | Admitting: Physician Assistant

## 2022-12-30 ENCOUNTER — Ambulatory Visit: Payer: Medicare HMO

## 2022-12-30 DIAGNOSIS — N3000 Acute cystitis without hematuria: Secondary | ICD-10-CM

## 2022-12-30 LAB — POCT URINALYSIS DIP (CLINITEK)
Bilirubin, UA: NEGATIVE
Blood, UA: NEGATIVE
Glucose, UA: NEGATIVE mg/dL
Ketones, POC UA: NEGATIVE mg/dL
Leukocytes, UA: NEGATIVE
Nitrite, UA: NEGATIVE
POC PROTEIN,UA: NEGATIVE
Spec Grav, UA: 1.015 (ref 1.010–1.025)
Urobilinogen, UA: 0.2 U/dL
pH, UA: 6 (ref 5.0–8.0)

## 2022-12-30 NOTE — Progress Notes (Signed)
Patient is in office today for a nurse visit for Repeat UA. Patient UA was  negative for all components Patient denies any urine symptoms.

## 2023-01-06 DIAGNOSIS — M48061 Spinal stenosis, lumbar region without neurogenic claudication: Secondary | ICD-10-CM | POA: Diagnosis not present

## 2023-01-06 DIAGNOSIS — M47816 Spondylosis without myelopathy or radiculopathy, lumbar region: Secondary | ICD-10-CM | POA: Diagnosis not present

## 2023-01-06 DIAGNOSIS — M4807 Spinal stenosis, lumbosacral region: Secondary | ICD-10-CM | POA: Diagnosis not present

## 2023-01-06 DIAGNOSIS — Z6827 Body mass index (BMI) 27.0-27.9, adult: Secondary | ICD-10-CM | POA: Diagnosis not present

## 2023-01-06 DIAGNOSIS — M4856XA Collapsed vertebra, not elsewhere classified, lumbar region, initial encounter for fracture: Secondary | ICD-10-CM | POA: Diagnosis not present

## 2023-01-06 DIAGNOSIS — S22080A Wedge compression fracture of T11-T12 vertebra, initial encounter for closed fracture: Secondary | ICD-10-CM | POA: Diagnosis not present

## 2023-01-07 ENCOUNTER — Other Ambulatory Visit: Payer: Self-pay | Admitting: Student

## 2023-01-07 ENCOUNTER — Telehealth (HOSPITAL_COMMUNITY): Payer: Self-pay

## 2023-01-07 ENCOUNTER — Other Ambulatory Visit (HOSPITAL_COMMUNITY): Payer: Self-pay | Admitting: Neuroradiology

## 2023-01-07 DIAGNOSIS — S32010A Wedge compression fracture of first lumbar vertebra, initial encounter for closed fracture: Secondary | ICD-10-CM

## 2023-01-07 DIAGNOSIS — S22080A Wedge compression fracture of T11-T12 vertebra, initial encounter for closed fracture: Secondary | ICD-10-CM

## 2023-01-07 NOTE — Telephone Encounter (Signed)
-----   Message from Katyucia De Macedo Rodrigues sent at 01/07/2023 10:11 AM EST ----- Regarding: RE: KP referral You can schedule her. Thanks. ----- Message ----- From: Carolee Rosina BIRCH Sent: 01/07/2023   9:32 AM EST To: Curtis everitt Nile Lizzie, MD Subject: KP referral                                    Kat,   New referral from Neurosurgery for T11 and L1 compression fracture. Please review.   Thanks,  Erwin

## 2023-01-10 ENCOUNTER — Encounter (HOSPITAL_COMMUNITY): Payer: Self-pay | Admitting: Neuroradiology

## 2023-01-10 ENCOUNTER — Ambulatory Visit (HOSPITAL_COMMUNITY)
Admission: RE | Admit: 2023-01-10 | Discharge: 2023-01-10 | Disposition: A | Discharge status: Home / Self Care | Payer: Medicare HMO | Source: Ambulatory Visit | Attending: Neuroradiology | Admitting: Neuroradiology

## 2023-01-10 DIAGNOSIS — S22080A Wedge compression fracture of T11-T12 vertebra, initial encounter for closed fracture: Secondary | ICD-10-CM

## 2023-01-10 DIAGNOSIS — S32010A Wedge compression fracture of first lumbar vertebra, initial encounter for closed fracture: Secondary | ICD-10-CM

## 2023-01-10 NOTE — Consult Note (Signed)
 Chief Complaint: Patient was seen in consultation today for osteoporotic spinal fractures.  Supervising Physician: De Macedo Rodrigues, Zigmund Linse  Patient Status: [facility] - Out-pt  History of Present Illness: Virginia Montoya is a 73 y.o. female with chief complain of back pain. She refers chronic back pain. However, in October 2024 she had sudden onset of acute, severe back pain and was diagnosed with vertebral fractures. Several weeks later she had a second episode of acute, severe back pain and was diagnosed with another fracture. Her back pain has not improved despite medical management including NSAID, gabapentin , oxycodone  and braces. Currently, she grades the pain 9/10. It has greatly affected her ability of carrying out activities of daily life. She does  have a history of osteoporosis on Prolia .   Past Medical History:  Diagnosis Date   Chronic insomnia    Depression    Gastroesophageal reflux disease    Hyperlipidemia    Osteoporosis    Precordial chest pain 11/17/2016   Prediabetes     Past Surgical History:  Procedure Laterality Date   CHOLECYSTECTOMY  1999   CORONARY CALCIUM  SCORE/CORONARY CTA  01/2017   Coronary calcium  score is 0.  Normal coronary origin with normal right dominance.  No evidence of CAD.  Most likely noncardiac chest pain.   SHOULDER SURGERY  2005   TONSILLECTOMY     TUBAL LIGATION  1977    Allergies: Cephalexin, Alendronate , Biaxin [clarithromycin], Ciprofloxacin , Macrobid [nitrofurantoin], and Sulfa  antibiotics  Medications: Prior to Admission medications   Medication Sig Start Date End Date Taking? Authorizing Provider  acetaminophen  (TYLENOL ) 500 MG tablet Take 500 mg every 6 (six) hours as needed by mouth.    [provider]  celecoxib  (CELEBREX ) 200 MG capsule TAKE 1 CAPSULE(200 MG) BY MOUTH DAILY 12/30/22   Sirivol, Mamatha, MD  cholecalciferol (VITAMIN D3) 25 MCG (1000 UNIT) tablet Take 1,000 Units by mouth 2 (two) times daily.     [provider]  diclofenac  Sodium (VOLTAREN ) 1 % GEL 4 gms four times a day as needed hip pain 12/24/22   Cox, Abigail, MD  famotidine  (PEPCID ) 20 MG tablet Take 1 tablet (20 mg total) by mouth 2 (two) times daily. 05/03/22   CoxAbigail, MD  fluticasone  (FLONASE ) 50 MCG/ACT nasal spray SHAKE LIQUID AND USE 2 SPRAYS IN EACH NOSTRIL DAILY 05/17/22   Cox, Kirsten, MD  gabapentin  (NEURONTIN ) 100 MG capsule Take 1 capsule (100 mg total) by mouth 3 (three) times daily. 11/22/22   Teressa Harrie HERO, FNP  LORazepam  (ATIVAN ) 0.5 MG tablet Take 1 tablet (0.5 mg total) by mouth every 8 (eight) hours as needed for anxiety. 10/11/22   CoxAbigail, MD  methocarbamol  (ROBAXIN ) 500 MG tablet Take 500 mg by mouth 4 (four) times daily. 11/29/22   [provider]  Multiple Vitamins-Minerals (PRESERVISION AREDS 2 PO) Take by mouth.    [provider]  omeprazole  (PRILOSEC) 20 MG capsule TAKE 1 CAPSULE(20 MG) BY MOUTH DAILY 10/19/22   Cox, Kirsten, MD  oxyCODONE -acetaminophen  (PERCOCET/ROXICET) 5-325 MG tablet Take 1 tablet by mouth every 6 (six) hours as needed. 11/30/22   [provider]  PROLIA  60 MG/ML SOSY injection Inject into the skin. 12/07/22   [provider]  rosuvastatin  (CRESTOR ) 20 MG tablet TAKE 1 TABLET(20 MG) BY MOUTH AT BEDTIME 11/07/22   Cox, Kirsten, MD  venlafaxine  XR (EFFEXOR -XR) 150 MG 24 hr capsule TAKE 1 CAPSULE BY MOUTH DAILY 11/30/22   Cox, Kirsten, MD  vitamin B-12 (CYANOCOBALAMIN)  1000 MCG tablet Take 1,000 mcg by mouth daily.    [provider]  zolpidem (AMBIEN) 10 MG tablet TAKE 1 TABLET BY MOUTH AT BEDTIME 10/11/22   CoxAbigail, MD     Family History  Problem Relation Age of Onset   Diabetes Mother    Hyperlipidemia Mother    Emphysema Father    Diabetes Maternal Grandmother    Liver cancer Maternal Grandmother     Social History   Socioeconomic History   Marital status: Married    Spouse name: Jerilynn   Number of  children: 3   Years of education: 12   Highest education level: Associate degree: academic program  Occupational History   Occupation: Retired  Tobacco Use   Smoking status: Never   Smokeless tobacco: Never  Vaping Use   Vaping status: Never Used  Substance and Sexual Activity   Alcohol use: No   Drug use: No   Sexual activity: Not Currently  Other Topics Concern   Not on file  Social History Narrative   She is a married (remarried) mother of 3 with 4 grandchildren.  She has 3 great-grandchildren.  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  She completed high school and beauty school.   She works outside a lot in the yard on gardening.  She does a lot of walking and exercises with walking for at least a half an hour a day 5 days a week.   wears sunscreen, brushes and flosses daily, see's dentist bi-annually, has smoke/carbon monoxide detectors, wears a seatbelt and practices gun safety   Social Drivers of Health   Financial Resource Strain: Low Risk  (05/07/2021)   Overall Financial Resource Strain (CARDIA)    Difficulty of Paying Living Expenses: Not hard at all  Food Insecurity: No Food Insecurity (05/10/2022)   Hunger Vital Sign    Worried About Running Out of Food in the Last Year: Never true    Ran Out of Food in the Last Year: Never true  Transportation Needs: No Transportation Needs (05/10/2022)   PRAPARE - Administrator, Civil Service (Medical): No    Lack of Transportation (Non-Medical): No  Physical Activity: Inactive (08/23/2022)   Exercise Vital Sign    Days of Exercise per Week: 0 days    Minutes of Exercise per Session: 0 min  Stress: No Stress Concern Present (05/10/2022)   [employer] of Occupational Health - Occupational Stress Questionnaire    Feeling of Stress : Only a little  Social Connections: Moderately Integrated (05/07/2021)   Social Connection and Isolation Panel [NHANES]    Frequency of Communication with Friends and Family:  More than three times a week    Frequency of Social Gatherings with Friends and Family: Three times a week    Attends Religious Services: More than 4 times per year    Active Member of Clubs or Organizations: No    Attends Banker Meetings: Never    Marital Status: Married     Review of Systems: A 12 point ROS discussed and pertinent positives are indicated in the HPI above.  All other systems are negative.  Review of Systems  Vital Signs: LMP  (LMP Unknown)   Physical Exam Musculoskeletal:       Back:  Neurological:     Mental Status: She is alert and oriented to person, place, and time.          Imaging: MRI thoracic and lumbar spine December 22, 2022 shows acute/subacute compression fractures of T11 and L1.    Labs:  CBC: Recent Labs    05/03/22 1022 12/24/22 1056  WBC 6.1 7.6  HGB 14.7 14.2  HCT 44.4 43.5  PLT 349 371    COAGS: No results for input(s): INR, APTT in the last 8760 hours.  BMP: Recent Labs    05/03/22 1022 08/23/22 1033 12/24/22 1056  NA 139 140 143  K 4.6 5.2 5.3*  CL 102 102 102  CO2 22 26 23   GLUCOSE 113* 109* 105*  BUN 12 8 10   CALCIUM  9.6 9.6 9.7  CREATININE 0.58 0.64 0.66    LIVER FUNCTION TESTS: Recent Labs    05/03/22 1022 08/23/22 1033 12/24/22 1056  BILITOT 0.4 0.4 0.4  AST 17 17 24   ALT 17 18 16   ALKPHOS 75 95 125*  PROT 6.8 6.8 6.9  ALBUMIN 4.5 4.6 4.5    TUMOR MARKERS: No results for input(s): AFPTM, CEA, CA199, CHROMGRNA in the last 8760 hours.  Assessment and Plan:  Mrs. Dicesare is a pleasant 73 year old female with subacute compression fractures of the T11 and L1 vertebral bodies. She has failed medical management and has severe persistent pain. Risks and benefits of balloon kyphoplasty were discussed with the patient and her husband. All questions were answered to their satisfaction. She would like to proceed with vertebral augmentation under moderate sedation.  Thank you  for this interesting consult.  I greatly enjoyed meeting Virginia Montoya and look forward to participating in their care.  A copy of this report was sent to the requesting provider on this date.  Electronically Signed: Akeema Broder De Macedo Rodrigues, MD 01/10/2023, 3:14 PM   I spent a total of  30 Minutes   in face to face in clinical consultation, greater than 50% of which was counseling/coordinating care for osteoporotic fragility fractures of T11 and L1.

## 2023-01-12 ENCOUNTER — Other Ambulatory Visit (HOSPITAL_COMMUNITY): Payer: Self-pay | Admitting: Neuroradiology

## 2023-01-12 DIAGNOSIS — S32010A Wedge compression fracture of first lumbar vertebra, initial encounter for closed fracture: Secondary | ICD-10-CM

## 2023-01-12 DIAGNOSIS — S22080A Wedge compression fracture of T11-T12 vertebra, initial encounter for closed fracture: Secondary | ICD-10-CM

## 2023-01-13 ENCOUNTER — Other Ambulatory Visit (HOSPITAL_COMMUNITY): Payer: Self-pay | Admitting: Student

## 2023-01-13 DIAGNOSIS — M810 Age-related osteoporosis without current pathological fracture: Secondary | ICD-10-CM

## 2023-01-14 ENCOUNTER — Encounter (HOSPITAL_COMMUNITY): Payer: Self-pay

## 2023-01-14 ENCOUNTER — Ambulatory Visit (HOSPITAL_COMMUNITY)
Admission: RE | Admit: 2023-01-14 | Discharge: 2023-01-14 | Disposition: A | Discharge status: Home / Self Care | Payer: Self-pay | Source: Ambulatory Visit | Attending: Neuroradiology | Admitting: Neuroradiology

## 2023-01-14 ENCOUNTER — Other Ambulatory Visit: Payer: Self-pay

## 2023-01-14 DIAGNOSIS — M8008XA Age-related osteoporosis with current pathological fracture, vertebra(e), initial encounter for fracture: Secondary | ICD-10-CM | POA: Insufficient documentation

## 2023-01-14 DIAGNOSIS — K219 Gastro-esophageal reflux disease without esophagitis: Secondary | ICD-10-CM | POA: Insufficient documentation

## 2023-01-14 DIAGNOSIS — M4855XA Collapsed vertebra, not elsewhere classified, thoracolumbar region, initial encounter for fracture: Secondary | ICD-10-CM | POA: Insufficient documentation

## 2023-01-14 DIAGNOSIS — E785 Hyperlipidemia, unspecified: Secondary | ICD-10-CM | POA: Diagnosis not present

## 2023-01-14 DIAGNOSIS — M810 Age-related osteoporosis without current pathological fracture: Secondary | ICD-10-CM

## 2023-01-14 DIAGNOSIS — S32010A Wedge compression fracture of first lumbar vertebra, initial encounter for closed fracture: Secondary | ICD-10-CM

## 2023-01-14 DIAGNOSIS — M4856XA Collapsed vertebra, not elsewhere classified, lumbar region, initial encounter for fracture: Secondary | ICD-10-CM | POA: Diagnosis not present

## 2023-01-14 DIAGNOSIS — M4854XA Collapsed vertebra, not elsewhere classified, thoracic region, initial encounter for fracture: Secondary | ICD-10-CM | POA: Diagnosis not present

## 2023-01-14 DIAGNOSIS — S22080A Wedge compression fracture of T11-T12 vertebra, initial encounter for closed fracture: Secondary | ICD-10-CM

## 2023-01-14 HISTORY — PX: IR KYPHO LUMBAR INC FX REDUCE BONE BX UNI/BIL CANNULATION INC/IMAGING: IMG5519

## 2023-01-14 HISTORY — PX: IR KYPHO THORACIC WITH BONE BIOPSY: IMG5518

## 2023-01-14 LAB — GLUCOSE, CAPILLARY
Glucose-Capillary: 96 mg/dL (ref 70–99)
Glucose-Capillary: 85 mg/dL (ref 70–99)

## 2023-01-14 LAB — CBC
HCT: 41.1 % (ref 36.0–46.0)
Hemoglobin: 13.6 g/dL (ref 12.0–15.0)
MCH: 29.2 pg (ref 26.0–34.0)
MCHC: 33.1 g/dL (ref 30.0–36.0)
MCV: 88.4 fL (ref 80.0–100.0)
Platelets: 368 10*3/uL (ref 150–400)
RBC: 4.65 MIL/uL (ref 3.87–5.11)
RDW: 13.4 % (ref 11.5–15.5)
WBC: 7.3 10*3/uL (ref 4.0–10.5)
nRBC: 0 % (ref 0.0–0.2)

## 2023-01-14 LAB — PROTIME-INR
INR: 1 (ref 0.8–1.2)
Prothrombin Time: 13.6 s (ref 11.4–15.2)

## 2023-01-14 MED ORDER — KETOROLAC TROMETHAMINE 30 MG/ML IJ SOLN
INTRAMUSCULAR | Status: AC
Start: 2023-01-14 — End: ?
  Filled 2023-01-14: qty 1

## 2023-01-14 MED ORDER — KETOROLAC TROMETHAMINE 30 MG/ML IJ SOLN
INTRAMUSCULAR | Status: AC | PRN
  Administered 2023-01-14: 30 mg via INTRAVENOUS

## 2023-01-14 MED ORDER — ACETAMINOPHEN 325 MG PO TABS
650.0000 mg | ORAL_TABLET | Freq: Four times a day (QID) | ORAL | Status: DC | PRN
  Administered 2023-01-14: 650 mg via ORAL
  Filled 2023-01-14: qty 2

## 2023-01-14 MED ORDER — OXYCODONE HCL 5 MG PO TABS: 5.0000 mg | ORAL_TABLET | ORAL | Status: DC | PRN

## 2023-01-14 MED ORDER — MIDAZOLAM HCL 2 MG/2ML IJ SOLN
INTRAMUSCULAR | Status: AC | PRN
  Administered 2023-01-14: .5 mg via INTRAVENOUS
  Administered 2023-01-14: 1 mg via INTRAVENOUS
  Administered 2023-01-14: .5 mg via INTRAVENOUS
  Administered 2023-01-14: 1 mg via INTRAVENOUS
  Administered 2023-01-14: .5 mg via INTRAVENOUS

## 2023-01-14 MED ORDER — VANCOMYCIN HCL IN DEXTROSE 1-5 GM/200ML-% IV SOLN
INTRAVENOUS | Status: AC | PRN
  Administered 2023-01-14: 1000 mg via INTRAVENOUS

## 2023-01-14 MED ORDER — LIDOCAINE HCL (PF) 1 % IJ SOLN
INTRAMUSCULAR | Status: AC
  Filled 2023-01-14: qty 30

## 2023-01-14 MED ORDER — IOHEXOL 300 MG/ML  SOLN
50.0000 mL | Freq: Once | INTRAMUSCULAR | Status: AC | PRN
  Administered 2023-01-14: 10 mL

## 2023-01-14 MED ORDER — BUPIVACAINE HCL (PF) 0.25 % IJ SOLN
30.0000 mL | Freq: Once | INTRAMUSCULAR | Status: AC
  Administered 2023-01-14: 10 mL

## 2023-01-14 MED ORDER — BUPIVACAINE HCL (PF) 0.25 % IJ SOLN
30.0000 mL | Freq: Once | INTRAMUSCULAR | Status: AC
  Administered 2023-01-14: 30 mL

## 2023-01-14 MED ORDER — IOHEXOL 300 MG/ML  SOLN
50.0000 mL | Freq: Once | INTRAMUSCULAR | Status: AC | PRN
  Administered 2023-01-14: 50 mL

## 2023-01-14 MED ORDER — VANCOMYCIN HCL IN DEXTROSE 1-5 GM/200ML-% IV SOLN: 1000.0000 mg | INTRAVENOUS | Status: DC

## 2023-01-14 MED ORDER — VANCOMYCIN HCL IN DEXTROSE 1-5 GM/200ML-% IV SOLN
INTRAVENOUS | Status: AC
  Filled 2023-01-14: qty 200

## 2023-01-14 MED ORDER — LIDOCAINE HCL (PF) 1 % IJ SOLN
30.0000 mL | Freq: Once | INTRAMUSCULAR | Status: AC
  Administered 2023-01-14: 15 mL via INTRADERMAL

## 2023-01-14 MED ORDER — BUPIVACAINE HCL (PF) 0.25 % IJ SOLN
INTRAMUSCULAR | Status: AC
  Filled 2023-01-14: qty 30

## 2023-01-14 MED ORDER — MIDAZOLAM HCL 2 MG/2ML IJ SOLN
INTRAMUSCULAR | Status: AC
  Filled 2023-01-14: qty 4

## 2023-01-14 MED ORDER — LIDOCAINE HCL (PF) 1 % IJ SOLN
30.0000 mL | Freq: Once | INTRAMUSCULAR | Status: AC
  Administered 2023-01-14: 30 mL via INTRADERMAL

## 2023-01-14 MED ORDER — FENTANYL CITRATE (PF) 100 MCG/2ML IJ SOLN
INTRAMUSCULAR | Status: AC
  Filled 2023-01-14: qty 4

## 2023-01-14 MED ORDER — FENTANYL CITRATE (PF) 100 MCG/2ML IJ SOLN
INTRAMUSCULAR | Status: AC | PRN
  Administered 2023-01-14: 25 ug via INTRAVENOUS
  Administered 2023-01-14 (×2): 50 ug via INTRAVENOUS
  Administered 2023-01-14: 25 ug via INTRAVENOUS

## 2023-01-14 NOTE — H&P (Signed)
 Chief Complaint: T11 and K1 Compression fracture. The Patient presents today for T11 and L1 Kyphoplasty.   Supervising Physician: De Macedo Rodrigues, [place]ia  Patient Status: [facility] - Out-pt  History of Present Illness: Virginia Montoya is a 73 y.o. female outpatient. History of HLD, GERD, OA. Diagnosed with cerebral  fractures after a sudden onset of back pain since October 2024. MRI from 12.18.24 shows T11 and L1 compression fracture. The patient was seen for consultation in the [facility]  on 1.6.25  with IR Attending ,Dr. MARLA. de Nile Erichsen. At that time a detailed discussion regarding the Patient's medical condition including but not limited to possible treatment options took place. Following that discussion the Patient elected to proceed with  T11 and L1 Kyphoplasty,. The Patient presents today for T11 and L1 Kyphoplasty.  Husband at bedside Patient alert and laying in bed,calm. Endorses lower right sie back pain that radiates around her hip. Denies any fevers, headache, chest pain, SOB, cough, abdominal pain, nausea, vomiting or bleeding.   Labs from 12.20.24 unremarkable. All medications are within acceptable parameters. See list of allergies. Patient has been NPO since midnight.  Return precautions and treatment recommendations and follow-up discussed with the patient and her family at bedside. Both who are agreeable with the plan.    Past Medical History:  Diagnosis Date   Chronic insomnia    Depression    Gastroesophageal reflux disease    Hyperlipidemia    Osteoporosis    Precordial chest pain 11/17/2016   Prediabetes     Past Surgical History:  Procedure Laterality Date   CHOLECYSTECTOMY  1999   CORONARY CALCIUM  SCORE/CORONARY CTA  01/2017   Coronary calcium  score is 0.  Normal coronary origin with normal right dominance.  No evidence of CAD.  Most likely noncardiac chest pain.   SHOULDER SURGERY  2005   TONSILLECTOMY     TUBAL  LIGATION  1977    Allergies: Cephalexin, Alendronate , Biaxin [clarithromycin], Ciprofloxacin , Macrobid [nitrofurantoin], and Sulfa  antibiotics  Medications: Prior to Admission medications   Medication Sig Start Date End Date Taking? Authorizing Provider  celecoxib  (CELEBREX ) 200 MG capsule TAKE 1 CAPSULE(200 MG) BY MOUTH DAILY 12/30/22  Yes Sirivol, Mamatha, MD  cholecalciferol (VITAMIN D3) 25 MCG (1000 UNIT) tablet Take 1,000 Units by mouth 2 (two) times daily.   Yes [provider]  famotidine  (PEPCID ) 20 MG tablet Take 1 tablet (20 mg total) by mouth 2 (two) times daily. 05/03/22  Yes Cox, Kirsten, MD  fluticasone  (FLONASE ) 50 MCG/ACT nasal spray SHAKE LIQUID AND USE 2 SPRAYS IN EACH NOSTRIL DAILY 05/17/22  Yes Cox, Kirsten, MD  gabapentin  (NEURONTIN ) 100 MG capsule Take 1 capsule (100 mg total) by mouth 3 (three) times daily. 11/22/22  Yes Teressa Harrie HERO, FNP  LORazepam  (ATIVAN ) 0.5 MG tablet Take 1 tablet (0.5 mg total) by mouth every 8 (eight) hours as needed for anxiety. 10/11/22  Yes Cox, Kirsten, MD  methocarbamol  (ROBAXIN ) 500 MG tablet Take 500 mg by mouth 4 (four) times daily. 11/29/22  Yes [provider]  Multiple Vitamins-Minerals (PRESERVISION AREDS 2 PO) Take by mouth.   Yes [provider]  omeprazole  (PRILOSEC) 20 MG capsule TAKE 1 CAPSULE(20 MG) BY MOUTH DAILY 10/19/22  Yes Cox, Kirsten, MD  oxyCODONE -acetaminophen  (PERCOCET/ROXICET) 5-325 MG tablet Take 1 tablet by mouth every 6 (six) hours as needed. 11/30/22  Yes [provider]  PROLIA  60 MG/ML SOSY injection Inject into the skin. 12/07/22  Yes [provider]  rosuvastatin  (CRESTOR ) 20 MG tablet TAKE 1 TABLET(20 MG) BY MOUTH AT BEDTIME 11/07/22  Yes Cox, Kirsten, MD  venlafaxine  XR (EFFEXOR -XR) 150 MG 24 hr capsule TAKE 1 CAPSULE BY MOUTH DAILY 11/30/22  Yes Cox, Kirsten, MD  vitamin B-12 (CYANOCOBALAMIN) 1000 MCG tablet Take 1,000 mcg by mouth daily.   Yes [provider]  zolpidem (AMBIEN) 10 MG tablet TAKE 1 TABLET BY MOUTH AT BEDTIME 10/11/22  Yes Cox, Kirsten, MD  acetaminophen  (TYLENOL ) 500 MG tablet Take 500 mg every 6 (six) hours as needed by mouth.    [provider]  diclofenac  Sodium (VOLTAREN ) 1 % GEL 4 gms four times a day as needed hip pain 12/24/22   Cox, Abigail, MD     Family History  Problem Relation Age of Onset   Diabetes Mother    Hyperlipidemia Mother    Emphysema Father    Diabetes Maternal Grandmother    Liver cancer Maternal Grandmother     Social History   Socioeconomic History   Marital status: Married    Spouse name: Jerilynn   Number of children: 3   Years of education: 12   Highest education level: Associate degree: academic program  Occupational History   Occupation: Retired  Tobacco Use   Smoking status: Never   Smokeless tobacco: Never  Vaping Use   Vaping status: Never Used  Substance and Sexual Activity   Alcohol use: No   Drug use: No   Sexual activity: Not Currently  Other Topics Concern   Not on file  Social History Narrative   She is a married (remarried) mother of 3 with 4 grandchildren.  She has 3 great-grandchildren.  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  She completed high school and beauty school.   She works outside a lot in the yard on gardening.  She does a lot of walking and exercises with walking for at least a half an hour a day 5 days a week.   wears sunscreen, brushes and flosses daily, see's dentist bi-annually, has smoke/carbon monoxide detectors, wears a seatbelt and practices gun safety   Social Drivers of Health   Financial Resource Strain: Low Risk  (05/07/2021)   Overall Financial Resource Strain (CARDIA)    Difficulty of Paying Living Expenses: Not hard at all  Food Insecurity: No Food Insecurity (05/10/2022)   Hunger Vital Sign    Worried About Running Out of Food in the Last Year: Never true    Ran Out of Food in the Last Year: Never true   Transportation Needs: No Transportation Needs (05/10/2022)   PRAPARE - Administrator, Civil Service (Medical): No    Lack of Transportation (Non-Medical): No  Physical Activity: Inactive (08/23/2022)   Exercise Vital Sign    Days of Exercise per Week: 0 days    Minutes of Exercise per Session: 0 min  Stress: No Stress Concern Present (05/10/2022)   [employer] of Occupational Health - Occupational Stress Questionnaire    Feeling of Stress : Only a little  Social Connections: Moderately Integrated (05/07/2021)   Social Connection and Isolation Panel [NHANES]    Frequency of Communication with Friends and Family: More than three times a week    Frequency of Social Gatherings with Friends and Family: Three times a week    Attends Religious Services: More than 4 times per year    Active Member of Clubs or Organizations: No    Attends Banker  Meetings: Never    Marital Status: Married     Review of Systems: A 12 point ROS discussed and pertinent positives are indicated in the HPI above.  All other systems are negative.  Review of Systems  Constitutional:  Negative for fatigue and fever.  HENT:  Negative for congestion.   Respiratory:  Negative for cough and shortness of breath.   Gastrointestinal:  Negative for abdominal pain, diarrhea, nausea and vomiting.  Musculoskeletal:  Positive for back pain (lower right side radiates to her hip).    Vital Signs: BP (!) 155/83   Pulse 94   Temp 98.9 F (37.2 C) (Oral)   Resp 16   Ht 5' 6 (1.676 m)   Wt 173 lb (78.5 kg)   LMP  (LMP Unknown)   SpO2 94%   BMI 27.92 kg/m   Advance Care Plan: The advanced care plan/surrogate decision maker was discussed at the time of visit and documented in the medical record.  hisband   Physical Exam Vitals and nursing note reviewed.  Constitutional:      Appearance: She is well-developed.  HENT:     Head: Normocephalic and atraumatic.  Eyes:     Conjunctiva/sclera:  Conjunctivae normal.  Cardiovascular:     Rate and Rhythm: Normal rate and regular rhythm.  Pulmonary:     Effort: Pulmonary effort is normal.  Musculoskeletal:        General: Normal range of motion.     Cervical back: Normal range of motion.  Skin:    General: Skin is warm.  Neurological:     General: No focal deficit present.     Mental Status: She is alert and oriented to person, place, and time. Mental status is at baseline.  Psychiatric:        Mood and Affect: Mood normal.        Behavior: Behavior normal.        Thought Content: Thought content normal.        Judgment: Judgment normal.     Imaging: No results found.  Labs:  CBC: Recent Labs    05/03/22 1022 12/24/22 1056  WBC 6.1 7.6  HGB 14.7 14.2  HCT 44.4 43.5  PLT 349 371    COAGS: No results for input(s): INR, APTT in the last 8760 hours.  BMP: Recent Labs    05/03/22 1022 08/23/22 1033 12/24/22 1056  NA 139 140 143  K 4.6 5.2 5.3*  CL 102 102 102  CO2 22 26 23   GLUCOSE 113* 109* 105*  BUN 12 8 10   CALCIUM  9.6 9.6 9.7  CREATININE 0.58 0.64 0.66    LIVER FUNCTION TESTS: Recent Labs    05/03/22 1022 08/23/22 1033 12/24/22 1056  BILITOT 0.4 0.4 0.4  AST 17 17 24   ALT 17 18 16   ALKPHOS 75 95 125*  PROT 6.8 6.8 6.9  ALBUMIN 4.5 4.6 4.5    Assessment and Plan:  73 y.o. female outpatient. History of HLD, GERD, OA. Diagnosed with cerebral  fractures after a sudden onset of back pain since October 2024. MRI from 12.18.24 shows T11 and L1 compression fracture. The patient was seen for consultation in the [facility]  on 1.6.25  with IR Attending ,Dr. MARLA. de Nile Erichsen. At that time a detailed discussion regarding the Patient's medical condition including but not limited to possible treatment options took place. Following that discussion the Patient elected to proceed with  T11 and L1 Kyphoplasty,. The Patient presents today  for T11 and L1  Kyphoplasty.  PLAN: Image Guided T11 and L1 Kyphoplasty,.   Risks and benefits of T11 and L1 kyphoplasty/vertebroplasty were discussed with the patient including, but not limited to education regarding the natural healing process of compression fractures without intervention, bleeding, infection, cement migration which may cause spinal cord damage, paralysis, pulmonary embolism or even death. This interventional procedure involves the use of X-rays and because of the nature of the planned procedure, it is possible that we will have prolonged use of X-ray fluoroscopy. Potential radiation risks to you include (but are not limited to) the following: - A slightly elevated risk for cancer  several years later in life. This risk is typically less than 0.5% percent. This risk is low in comparison to the normal incidence of human cancer, which is 33% for women and 50% for men according to the [employer] Cancer Society. - Radiation induced injury can include skin redness, resembling a rash, tissue breakdown / ulcers and hair loss (which can be temporary or permanent).  The likelihood of either of these occurring depends on the difficulty of the procedure and whether you are sensitive to radiation due to previous procedures, disease, or genetic conditions.  IF your procedure requires a prolonged use of radiation, you will be notified and given written instructions for further action.  It is your responsibility to monitor the irradiated area for the 2 weeks following the procedure and to notify your physician if you are concerned that you have suffered a radiation induced injury.   All of the patient's questions were answered, patient and spouse both who are agreeable to proceed.  Consent signed and in chart.  Thank you for this interesting consult.  I greatly enjoyed meeting Virginia Montoya and look forward to participating in their care.  A copy of this report was sent to the requesting provider on this  date.  Electronically Signed: Delon JAYSON Beagle, NP 01/14/2023, 10:23 AM   I spent a total of  30 Minutes   in face to face in clinical consultation, greater than 50% of which was counseling/coordinating care for Kyphoplasty T11 & L1

## 2023-01-14 NOTE — Procedures (Signed)
 INTERVENTIONAL NEURORADIOLOGY BRIEF POSTPROCEDURE NOTE  FLUOROSCOPY GUIDED T11 AND L1 BALLOON KYPHOPLASTY, FLUOROSCOPY GUIDED T11 CORE BONE BIOPSY  Attending physician: Curtis everitt Nile Lizzie, MD  Diagnosis: Compression fractures T11 and L1  Access site: Percutaneous bilateral transpedicular.  Anesthesia: IR sedation: Moderate sedation.  Medication used: 3.5 mg Versed  IV; 150 mcg Fentanyl  IV.  Complications: None.  Estimated blood loss: Minimal.  Specimen:  1 core biopsy sample.  Findings: Compression fractures of T11 and L1 confirmed. Mild compression fracture of the L2 superior endplate, appears new. Chronic compression fracture of T12.   T11 and L1 balloon kyphoplasty was performed. A core biopsy sample of T11 was obtained and sent for tissue exam.  The patient tolerated the procedure well without incident or complication and is in stable condition.   PLAN: - Bed rest x3 hours.

## 2023-01-18 LAB — SURGICAL PATHOLOGY

## 2023-01-31 ENCOUNTER — Ambulatory Visit: Payer: Self-pay | Admitting: Family Medicine

## 2023-01-31 ENCOUNTER — Ambulatory Visit (INDEPENDENT_AMBULATORY_CARE_PROVIDER_SITE_OTHER): Payer: PPO

## 2023-01-31 VITALS — BP 122/88 | HR 106 | Temp 97.8°F | Ht 66.0 in | Wt 164.0 lb

## 2023-01-31 DIAGNOSIS — M48062 Spinal stenosis, lumbar region with neurogenic claudication: Secondary | ICD-10-CM | POA: Diagnosis not present

## 2023-01-31 DIAGNOSIS — G609 Hereditary and idiopathic neuropathy, unspecified: Secondary | ICD-10-CM | POA: Diagnosis not present

## 2023-01-31 DIAGNOSIS — G8929 Other chronic pain: Secondary | ICD-10-CM | POA: Diagnosis not present

## 2023-01-31 DIAGNOSIS — M545 Low back pain, unspecified: Secondary | ICD-10-CM | POA: Insufficient documentation

## 2023-01-31 DIAGNOSIS — M5441 Lumbago with sciatica, right side: Secondary | ICD-10-CM | POA: Diagnosis not present

## 2023-01-31 MED ORDER — GABAPENTIN 100 MG PO CAPS: 200.0000 mg | ORAL_CAPSULE | Freq: Three times a day (TID) | ORAL | 3 refills | Status: DC

## 2023-01-31 MED ORDER — DICLOFENAC SODIUM 1 % EX GEL: CUTANEOUS | 1 refills | Status: AC

## 2023-01-31 NOTE — Telephone Encounter (Signed)
  Chief Complaint: Back pain Symptoms: Back pain, pain with ambulation Frequency: Ongoing, worsening recently Pertinent Negatives: Patient denies numbness, weakness, urinary symptoms Disposition: [] ED /[] Urgent Care (no appt availability in office) / [x] Appointment(In office/virtual)/ []  [facility]/ [] Home Care/ [] Refused Recommended Disposition /[] [facility]/ []  Follow-up with PCP Additional Notes: Pt reports she has been experiencing chronic back pain for many years, she notes recently that the pain has increased significantly and is causing disruption in her ability to function normally in day to day tasks/activities. Pt reports pain at 8/10 with ambulation and notes her prescribed pain regimen is giving her little to no relief. OV scheduled today. This RN educated pt on home care, new-worsening symptoms, when to call back/seek emergent care. Pt verbalized understanding and agrees to plan.    Copied from CRM [id number]. Topic: Clinical - Red Word Triage >> Jan 31, 2023 11:09 AM Thomes Dinning wrote: Red Word that prompted transfer to Nurse Triage: Patient has been having pain with her osteoporosis and on a scale of 1-10 she describes it as an 8 when walking. Reason for Disposition  [1] SEVERE back pain (e.g., excruciating, unable to do any normal activities) AND [2] not improved 2 hours after pain medicine  Answer Assessment - Initial Assessment Questions 1. ONSET: "When did the pain begin?"      Ongoing, worsening  2. LOCATION: "Where does it hurt?" (upper, mid or lower back)     Low back 3. SEVERITY: "How bad is the pain?"  (e.g., Scale 1-10; mild, moderate, or severe)   - MILD (1-3): Doesn't interfere with normal activities.    - MODERATE (4-7): Interferes with normal activities or awakens from sleep.    - SEVERE (8-10): Excruciating pain, unable to do any normal activities.      8/10 with ambulation 4. PATTERN: "Is the pain constant?" (e.g., yes, no; constant,  intermittent)      Constant, worse intermittently 5. RADIATION: "Does the pain shoot into your legs or somewhere else?"     Radiates to right hip 8. MEDICINES: "What have you taken so far for the pain?" (e.g., nothing, acetaminophen, NSAIDS)     Prescribed pain regimen is giving her no relief 9. NEUROLOGIC SYMPTOMS: "Do you have any weakness, numbness, or problems with bowel/bladder control?"     None 10. OTHER SYMPTOMS: "Do you have any other symptoms?" (e.g., fever, abdomen pain, burning with urination, blood in urine)       None  Protocols used: Back Pain-A-AH

## 2023-01-31 NOTE — Assessment & Plan Note (Addendum)
Chronic low back pain with radiation to the right hip, exacerbated by walking and relieved by lying down. History of subacute compression fractures at T11 and L1 treated with kyphoplasty on January 14, 2023. Current pain is lower than previous fracture sites, possibly related to a suspected L2 fracture. Pain management includes multiple medications with partial relief.   Discussed increasing gabapentin dose due to current low dose and potential for better pain control. Complete pain relief is unlikely due to severe arthritis and vertebral fractures. Discussed alternative treatments including physical therapy and dry needling. Patient prefers to avoid new medications due to side effects. - Refill Voltaren gel - Increase gabapentin to 200 mg three times daily - Advise use of additional Tylenol (500 mg) 1-2 times as needed in addition to the percocet 5/325 mg she takes 2-3 times daily.  - Refer to [facility] for evaluation and treatment, including potential dry needling - Encourage use of heat and ice - Recommend stretching exercises to strengthen core - Contact back specialist to move appointment sooner if pain worsens - Consider repeat x-rays if new compression fracture is suspected   Osteoporosis Osteoporosis diagnosed via imaging, contributing to vertebral fractures and chronic pain. Patient is at risk for further fractures. - Continue current osteoporosis management - Monitor for new fractures and adjust treatment as necessary  Mild Bilateral Hip Arthritis Mild bilateral hip arthritis confirmed by x-rays in December. Not the primary source of current pain but contributes to overall discomfort. - Continue current management - Monitor for progression of symptoms  Follow-up - Follow up with back specialist on February 13, 2023 - Contact back specialist to move appointment sooner if pain worsens.

## 2023-01-31 NOTE — Progress Notes (Signed)
Acute Office Visit  Subjective:    Patient ID: Virginia Montoya, female    DOB: 1950-12-03, 73 y.o.   MRN: [id number]  Chief Complaint  Patient presents with   Low back pain    Discussed the use of AI scribe software for clinical note transcription with the patient, who gave verbal consent to proceed.      HPI: The patient, with a history of osteoporosis and previous vertebral fractures, presents with persistent lower back pain. The pain is described as constant, only alleviated when lying down, and is localized to the mid-lower back, radiating to the right hip. The patient reports that the pain was present prior to a recent kyphoplasty procedure performed for subacute compression fractures of the T11 and L1 vertebral bodies. The procedure provided relief for the treated area, but the lower back pain persisted.  The patient recalls a previous diagnosis of an L2 fracture, but is unsure of the details. The pain is described as similar to previous experiences of vertebral fractures. The patient denies any recent injury or trauma to the back. The pain does not radiate down the legs, but there is discomfort in the right buttock and hip region.  The patient has been managing the pain with a regimen of oxycodone, celecoxib, gabapentin, and methocarbamol, along with topical Voltaren gel. The medications only provide minimal relief, described as 'taking the edge off.' The patient has previously tried physical therapy and a TENS unit, which resulted in a subsequent vertebral fracture due to overexertion.  The patient's pain is significantly impacting her daily life, and she expresses a need for additional help managing the pain. The patient's pain is most severe in the afternoon and evening. Despite the pain, the patient acknowledges the importance of maintaining some level of physical activity and is open to restarting physical therapy.  Past Medical History:  Diagnosis Date   Chronic insomnia     Depression    Gastroesophageal reflux disease    Hyperlipidemia    Osteoporosis    Precordial chest pain 11/17/2016   Prediabetes     Past Surgical History:  Procedure Laterality Date   CHOLECYSTECTOMY  1999   CORONARY CALCIUM SCORE/CORONARY CTA  01/2017   Coronary calcium score is 0.  Normal coronary origin with normal right dominance.  No evidence of CAD.  Most likely noncardiac chest pain.   IR KYPHO LUMBAR INC FX REDUCE BONE BX UNI/BIL CANNULATION INC/IMAGING  01/14/2023   IR KYPHO THORACIC WITH BONE BIOPSY  01/14/2023   SHOULDER SURGERY  2005   TONSILLECTOMY     TUBAL LIGATION  1977    Family History  Problem Relation Age of Onset   Diabetes Mother    Hyperlipidemia Mother    Emphysema Father    Diabetes Maternal Grandmother    Liver cancer Maternal Grandmother     Social History   Socioeconomic History   Marital status: Married    Spouse name: Lyman Bishop   Number of children: 3   Years of education: 12   Highest education level: Associate degree: academic program  Occupational History   Occupation: Retired  Tobacco Use   Smoking status: Never   Smokeless tobacco: Never  Vaping Use   Vaping status: Never Used  Substance and Sexual Activity   Alcohol use: No   Drug use: No   Sexual activity: Not Currently  Other Topics Concern   Not on file  Social History Narrative   She is a married (remarried) mother of 3  with 4 grandchildren.  She has 3 great-grandchildren.  She previously worked at [employer] in the [employer].  She completed high school and beauty school.   She works outside a lot in the yard on gardening.  She does a lot of walking and exercises with walking for at least a half an hour a day 5 days a week.   wears sunscreen, brushes and flosses daily, see's dentist bi-annually, has smoke/carbon monoxide detectors, wears a seatbelt and practices gun safety   Social Drivers of Health   Financial Resource Strain: Low Risk  (05/07/2021)   Overall Financial  Resource Strain (CARDIA)    Difficulty of Paying Living Expenses: Not hard at all  Food Insecurity: No Food Insecurity (05/10/2022)   Hunger Vital Sign    Worried About Running Out of Food in the Last Year: Never true    Ran Out of Food in the Last Year: Never true  Transportation Needs: No Transportation Needs (05/10/2022)   PRAPARE - Administrator, Civil Service (Medical): No    Lack of Transportation (Non-Medical): No  Physical Activity: Inactive (08/23/2022)   Exercise Vital Sign    Days of Exercise per Week: 0 days    Minutes of Exercise per Session: 0 min  Stress: No Stress Concern Present (05/10/2022)   [employer] of Occupational Health - Occupational Stress Questionnaire    Feeling of Stress : Only a little  Social Connections: Moderately Integrated (05/07/2021)   Social Connection and Isolation Panel [NHANES]    Frequency of Communication with Friends and Family: More than three times a week    Frequency of Social Gatherings with Friends and Family: Three times a week    Attends Religious Services: More than 4 times per year    Active Member of Clubs or Organizations: No    Attends Banker Meetings: Never    Marital Status: Married  Catering manager Violence: Not At Risk (05/10/2022)   Humiliation, Afraid, Rape, and Kick questionnaire    Fear of Current or Ex-Partner: No    Emotionally Abused: No    Physically Abused: No    Sexually Abused: No    Outpatient Medications Prior to Visit  Medication Sig Dispense Refill   acetaminophen (TYLENOL) 500 MG tablet Take 500 mg every 6 (six) hours as needed by mouth.     celecoxib (CELEBREX) 200 MG capsule TAKE 1 CAPSULE(200 MG) BY MOUTH DAILY 30 capsule 1   cholecalciferol (VITAMIN D3) 25 MCG (1000 UNIT) tablet Take 1,000 Units by mouth 2 (two) times daily.     famotidine (PEPCID) 20 MG tablet Take 1 tablet (20 mg total) by mouth 2 (two) times daily. 180 tablet 3   fluticasone (FLONASE) 50 MCG/ACT nasal spray  SHAKE LIQUID AND USE 2 SPRAYS IN EACH NOSTRIL DAILY 16 g 6   LORazepam (ATIVAN) 0.5 MG tablet Take 1 tablet (0.5 mg total) by mouth every 8 (eight) hours as needed for anxiety. 90 tablet 2   methocarbamol (ROBAXIN) 500 MG tablet Take 500 mg by mouth 4 (four) times daily.     Multiple Vitamins-Minerals (PRESERVISION AREDS 2 PO) Take by mouth.     omeprazole (PRILOSEC) 20 MG capsule TAKE 1 CAPSULE(20 MG) BY MOUTH DAILY 90 capsule 1   oxyCODONE-acetaminophen (PERCOCET/ROXICET) 5-325 MG tablet Take 1 tablet by mouth every 6 (six) hours as needed.     PROLIA 60 MG/ML SOSY injection Inject into the skin.     rosuvastatin (CRESTOR) 20 MG tablet TAKE  1 TABLET(20 MG) BY MOUTH AT BEDTIME 90 tablet 0   venlafaxine XR (EFFEXOR-XR) 150 MG 24 hr capsule TAKE 1 CAPSULE BY MOUTH DAILY 90 capsule 1   vitamin B-12 (CYANOCOBALAMIN) 1000 MCG tablet Take 1,000 mcg by mouth daily.     zolpidem (AMBIEN) 10 MG tablet TAKE 1 TABLET BY MOUTH AT BEDTIME 90 tablet 1   diclofenac Sodium (VOLTAREN) 1 % GEL 4 gms four times a day as needed hip pain 350 g 1   gabapentin (NEURONTIN) 100 MG capsule Take 1 capsule (100 mg total) by mouth 3 (three) times daily. 90 capsule 3   No facility-administered medications prior to visit.    Allergies  Allergen Reactions   Cephalexin Nausea And Vomiting    ask   Alendronate     Gi upset   Biaxin [Clarithromycin] Other (See Comments)    CHEST PAIN   Ciprofloxacin     Severe reflux   Macrobid [Nitrofurantoin] Nausea And Vomiting    Stomach upset   Sulfa Antibiotics     Made her feel poorly.     Review of Systems  Constitutional:  Negative for appetite change, fatigue and fever.  HENT:  Negative for congestion, ear pain, sinus pressure and sore throat.   Respiratory:  Negative for cough, chest tightness, shortness of breath and wheezing.   Cardiovascular:  Negative for chest pain and palpitations.  Gastrointestinal:  Negative for abdominal pain, constipation, diarrhea, nausea  and vomiting.  Genitourinary:  Negative for dysuria and hematuria.  Musculoskeletal:  Positive for back pain (Low back pain). Negative for arthralgias, joint swelling and myalgias.  Skin:  Negative for rash.  Neurological:  Negative for dizziness, weakness and headaches.  Psychiatric/Behavioral:  Negative for dysphoric mood. The patient is not nervous/anxious.        Objective:        01/31/2023    4:14 PM 01/14/2023    3:04 PM 01/14/2023    2:34 PM  Vitals with BMI  Height 5\' 6"     Weight 164 lbs    BMI 26.48    Systolic 122 158 295  Diastolic 88 73 71  Pulse 106 86 83    Orthostatic VS for the past 72 hrs (Last 3 readings):  Patient Position BP Location  01/31/23 1614 Sitting Left Arm     Physical Exam Vitals and nursing note reviewed.  Constitutional:      Comments: In pain  HENT:     Head: Normocephalic and atraumatic.  Cardiovascular:     Rate and Rhythm: Normal rate and regular rhythm.  Pulmonary:     Effort: Pulmonary effort is normal.     Breath sounds: Normal breath sounds.  Musculoskeletal:     Comments: MUSCULOSKELETAL: No pain or discomfort upon palpation of the upper spine. No pain or discomfort upon palpation of the thoracic spine. Pain upon palpation of the upper lumbar spine. Tenderness at L2, L3 of the lower lumbar spine. Right SI joint tenderness. No pain or discomfort upon palpation of the right and left hips.  Neurological:     General: No focal deficit present.     Mental Status: She is alert.  Psychiatric:        Mood and Affect: Mood normal.     Health Maintenance Due  Topic Date Due   FOOT EXAM  Never done   OPHTHALMOLOGY EXAM  Never done    There are no preventive care reminders to display for this patient.   Lab Results  Component Value Date   TSH 2.010 05/03/2022   Lab Results  Component Value Date   WBC 7.3 01/14/2023   HGB 13.6 01/14/2023   HCT 41.1 01/14/2023   MCV 88.4 01/14/2023   PLT 368 01/14/2023   Lab Results   Component Value Date   NA 143 12/24/2022   K 5.3 (H) 12/24/2022   CO2 23 12/24/2022   GLUCOSE 105 (H) 12/24/2022   BUN 10 12/24/2022   CREATININE 0.66 12/24/2022   BILITOT 0.4 12/24/2022   ALKPHOS 125 (H) 12/24/2022   AST 24 12/24/2022   ALT 16 12/24/2022   PROT 6.9 12/24/2022   ALBUMIN 4.5 12/24/2022   CALCIUM 9.7 12/24/2022   EGFR 92 12/24/2022   Lab Results  Component Value Date   CHOL 169 12/24/2022   Lab Results  Component Value Date   HDL 53 12/24/2022   Lab Results  Component Value Date   LDLCALC 86 12/24/2022   Lab Results  Component Value Date   TRIG 179 (H) 12/24/2022   Lab Results  Component Value Date   CHOLHDL 3.2 12/24/2022   Lab Results  Component Value Date   HGBA1C 6.4 (H) 12/24/2022       Assessment & Plan:  Spinal stenosis of lumbar region with neurogenic claudication -     Ambulatory referral to Physical Therapy  Chronic bilateral low back pain with right-sided sciatica Assessment & Plan: Chronic low back pain with radiation to the right hip, exacerbated by walking and relieved by lying down. History of subacute compression fractures at T11 and L1 treated with kyphoplasty on January 14, 2023. Current pain is lower than previous fracture sites, possibly related to a suspected L2 fracture. Pain management includes multiple medications with partial relief.   Discussed increasing gabapentin dose due to current low dose and potential for better pain control. Complete pain relief is unlikely due to severe arthritis and vertebral fractures. Discussed alternative treatments including physical therapy and dry needling. Patient prefers to avoid new medications due to side effects. - Refill Voltaren gel - Increase gabapentin to 200 mg three times daily - Advise use of additional Tylenol (500 mg) 1-2 times as needed in addition to the percocet 5/325 mg she takes 2-3 times daily.  - Refer to [facility] for evaluation and treatment,  including potential dry needling - Encourage use of heat and ice - Recommend stretching exercises to strengthen core - Contact back specialist to move appointment sooner if pain worsens - Consider repeat x-rays if new compression fracture is suspected   Osteoporosis Osteoporosis diagnosed via imaging, contributing to vertebral fractures and chronic pain. Patient is at risk for further fractures. - Continue current osteoporosis management - Monitor for new fractures and adjust treatment as necessary  Mild Bilateral Hip Arthritis Mild bilateral hip arthritis confirmed by x-rays in December. Not the primary source of current pain but contributes to overall discomfort. - Continue current management - Monitor for progression of symptoms  Follow-up - Follow up with back specialist on February 13, 2023 - Contact back specialist to move appointment sooner if pain worsens.  Orders: -     Ambulatory referral to Physical Therapy  Idiopathic peripheral neuropathy -     Gabapentin; Take 2 capsules (200 mg total) by mouth 3 (three) times daily.  Dispense: 180 capsule; Refill: 3  Other orders -     Diclofenac Sodium; 4 gms four times a day as needed hip pain  Dispense: 350 g; Refill: 1  Meds ordered this encounter  Medications   diclofenac Sodium (VOLTAREN) 1 % GEL    Sig: 4 gms four times a day as needed hip pain    Dispense:  350 g    Refill:  1   gabapentin (NEURONTIN) 100 MG capsule    Sig: Take 2 capsules (200 mg total) by mouth 3 (three) times daily.    Dispense:  180 capsule    Refill:  3    Dose increased to 200 mg TID    Orders Placed This Encounter  Procedures   Ambulatory referral to Physical Therapy     Follow-up: No follow-ups on file.  An After Visit Summary was printed and given to the patient.     Windell Moment, MD [facility] [phone]

## 2023-01-31 NOTE — Patient Instructions (Signed)
VISIT SUMMARY:  During today's visit, we discussed your persistent lower back pain, which has been affecting your daily life despite your current pain management regimen. We reviewed your history of osteoporosis and previous vertebral fractures, and considered the possibility of a new L2 fracture. We also discussed your mild bilateral hip arthritis and its contribution to your overall discomfort.  YOUR PLAN:  -CHRONIC LOW BACK PAIN: Chronic low back pain is long-lasting pain in the lower back that can radiate to other areas, such as the hip. It is often exacerbated by activities like walking and relieved by lying down. We will increase your gabapentin dose to 200 mg three times daily, continue your use of Voltaren gel, and advise additional Tylenol (500 mg) as needed. You are also referred to [facility] for evaluation and treatment, including potential dry needling. Use heat and ice as needed, and perform stretching exercises to strengthen your core. If your pain worsens, contact your back specialist to move your appointment sooner. We may consider repeat x-rays if a new compression fracture is suspected.  -OSTEOPOROSIS: Osteoporosis is a condition where bones become weak and are more likely to fracture. We will continue your current osteoporosis management and monitor for any new fractures to adjust treatment as necessary.  -MILD BILATERAL HIP ARTHRITIS: Mild bilateral hip arthritis is a condition where there is mild inflammation and wear in both hip joints, contributing to discomfort. We will continue your current management and monitor for any progression of symptoms.  INSTRUCTIONS:  Follow up with your back specialist on February 13, 2023. If your pain worsens, contact the back specialist to move your appointment sooner.

## 2023-02-03 DIAGNOSIS — H353221 Exudative age-related macular degeneration, left eye, with active choroidal neovascularization: Secondary | ICD-10-CM | POA: Diagnosis not present

## 2023-02-08 DIAGNOSIS — S32010D Wedge compression fracture of first lumbar vertebra, subsequent encounter for fracture with routine healing: Secondary | ICD-10-CM | POA: Diagnosis not present

## 2023-02-08 DIAGNOSIS — Z6826 Body mass index (BMI) 26.0-26.9, adult: Secondary | ICD-10-CM | POA: Diagnosis not present

## 2023-02-08 DIAGNOSIS — S22080A Wedge compression fracture of T11-T12 vertebra, initial encounter for closed fracture: Secondary | ICD-10-CM | POA: Diagnosis not present

## 2023-02-10 ENCOUNTER — Telehealth: Payer: Self-pay

## 2023-02-10 ENCOUNTER — Other Ambulatory Visit: Payer: Self-pay

## 2023-02-10 MED ORDER — AMOXICILLIN 500 MG PO CAPS: 500.0000 mg | ORAL_CAPSULE | Freq: Two times a day (BID) | ORAL | 0 refills | Status: DC

## 2023-02-10 NOTE — Telephone Encounter (Signed)
 Left detailed message with information below. Rx sent. Patient to schedule appt if no improvement and if possible drop off a urine sample.

## 2023-02-10 NOTE — Telephone Encounter (Signed)
 Called to schedule an appt however, patient expressed that her daughter is currently in hospice and prefers not to come in if possible. Stated symptoms started yesterday/last night c/o dysuria, urinary urgency and frequency, fatigue, feels chills when she urinates.    Copied from CRM [id number]. Topic: Clinical - Medication Question >> Feb 10, 2023 10:13 AM Delon DASEN wrote: Reason for CRM: need medication for urinary tract infection, burning, painful urination, frequency and urgency, [phone]

## 2023-02-10 NOTE — Telephone Encounter (Signed)
 Please send amoxicillin  500 mg twice a day for 7 days, but she is allergic to numerous medications which are generally more appropriate for treatment of a bladder infection.  It would be worthwhile if she is able to bring us  a urine to culture.  If not, if she is not improving she should bring urine next week/and get an appointment.  Thank you, Dr. Sherre

## 2023-02-24 ENCOUNTER — Other Ambulatory Visit: Payer: Self-pay | Admitting: Family Medicine

## 2023-02-24 DIAGNOSIS — G609 Hereditary and idiopathic neuropathy, unspecified: Secondary | ICD-10-CM

## 2023-02-25 ENCOUNTER — Encounter: Payer: Self-pay | Admitting: Family

## 2023-02-25 ENCOUNTER — Telehealth: Payer: Self-pay | Admitting: Family Medicine

## 2023-02-25 ENCOUNTER — Ambulatory Visit: Payer: Self-pay | Admitting: Family Medicine

## 2023-02-25 ENCOUNTER — Telehealth: Payer: PPO | Admitting: Family

## 2023-02-25 VITALS — Ht 66.0 in | Wt 169.0 lb

## 2023-02-25 DIAGNOSIS — N3000 Acute cystitis without hematuria: Secondary | ICD-10-CM | POA: Diagnosis not present

## 2023-02-25 DIAGNOSIS — N390 Urinary tract infection, site not specified: Secondary | ICD-10-CM

## 2023-02-25 MED ORDER — LEVOFLOXACIN 500 MG PO TABS: 500.0000 mg | ORAL_TABLET | Freq: Every day | ORAL | 0 refills | Status: AC

## 2023-02-25 NOTE — Progress Notes (Signed)
 Virginia Montoya is a 73 y.o. 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) 01/31/2022   Seasonal allergic rhinitis due to pollen 10/25/2021   Chronic pain of left knee 05/03/2021   Macular degeneration 01/06/2021   Gastroesophageal reflux disease 01/05/2021   Spinal stenosis of lumbar region with neurogenic claudication 06/04/2019   GAD (generalized anxiety disorder) 06/04/2019   Mild recurrent major depression (HCC) 05/06/2019   Age-related osteoporosis without current pathological fracture 03/18/2019   Idiopathic peripheral neuropathy 05/23/2018   Contracture of tendon sheath 01/17/2018   Mixed hyperlipidemia 11/17/2016    Current Outpatient Medications  Medication Sig Dispense Refill   acetaminophen (TYLENOL) 500 MG tablet Take 500 mg every 6 (six) hours as needed by mouth.     celecoxib (CELEBREX) 200 MG capsule TAKE 1 CAPSULE(200 MG) BY MOUTH DAILY 30 capsule 1   cholecalciferol (VITAMIN D3) 25 MCG (1000 UNIT) tablet Take 1,000 Units by mouth 2 (two) times daily.     diclofenac Sodium (VOLTAREN) 1 % GEL 4 gms four times a day as needed hip pain 350 g 1   famotidine (PEPCID) 20 MG tablet Take 1 tablet (20 mg total) by mouth 2 (two) times daily. 180 tablet 3   fluticasone (FLONASE) 50 MCG/ACT nasal spray SHAKE LIQUID AND USE 2 SPRAYS IN EACH NOSTRIL DAILY 16 g 6   gabapentin (NEURONTIN) 100 MG capsule Take 2 capsules (200 mg total) by mouth 3 (three) times daily. 180 capsule 3    LORazepam (ATIVAN) 0.5 MG tablet Take 1 tablet (0.5 mg total) by mouth every 8 (eight) hours as needed for anxiety. 90 tablet 2   methocarbamol (ROBAXIN) 500 MG tablet Take 500 mg by mouth 4 (four) times daily.     Multiple Vitamins-Minerals (PRESERVISION AREDS 2 PO) Take by mouth.     omeprazole (PRILOSEC) 20 MG capsule TAKE 1 CAPSULE(20 MG) BY MOUTH DAILY 90 capsule 1   oxyCODONE-acetaminophen (PERCOCET/ROXICET) 5-325 MG tablet Take 1 tablet by mouth every 6 (six) hours as needed.     PROLIA 60 MG/ML SOSY injection Inject into the skin.     rosuvastatin (CRESTOR) 20 MG tablet TAKE 1 TABLET(20 MG) BY MOUTH AT BEDTIME 90 tablet 0   venlafaxine XR (EFFEXOR-XR) 150 MG 24 hr capsule TAKE 1 CAPSULE BY MOUTH DAILY 90 capsule 1   vitamin B-12 (CYANOCOBALAMIN) 1000 MCG tablet Take 1,000 mcg by mouth daily.     zolpidem (AMBIEN) 10 MG tablet TAKE 1 TABLET BY MOUTH AT BEDTIME 90 tablet 1   amoxicillin (AMOXIL) 500 MG capsule Take 1 capsule (500 mg total) by mouth 2 (two) times daily. (Patient not taking: Reported on 02/25/2023) 14 capsule 0   levofloxacin (LEVAQUIN) 500 MG tablet Take 1 tablet (500 mg total) by mouth daily for 7 days. 7 tablet 0   No current facility-administered medications for this visit.    Allergies: Cephalexin, Alendronate, Biaxin [clarithromycin], Ciprofloxacin, Macrobid [nitrofurantoin], and Sulfa antibiotics  Past Medical History:  Diagnosis Date   Chronic insomnia    Depression    Gastroesophageal  reflux disease    Hyperlipidemia    Osteoporosis    Precordial chest pain 11/17/2016   Prediabetes     Past Surgical History:  Procedure Laterality Date   CHOLECYSTECTOMY  1999   CORONARY CALCIUM SCORE/CORONARY CTA  01/2017   Coronary calcium score is 0.  Normal coronary origin with normal right dominance.  No evidence of CAD.  Most likely noncardiac chest pain.   IR KYPHO LUMBAR INC FX REDUCE BONE BX UNI/BIL CANNULATION INC/IMAGING  01/14/2023   IR KYPHO THORACIC WITH BONE  BIOPSY  01/14/2023   SHOULDER SURGERY  2005   TONSILLECTOMY     TUBAL LIGATION  1977    Family History  Problem Relation Age of Onset   Diabetes Mother    Hyperlipidemia Mother    Emphysema Father    Diabetes Maternal Grandmother    Liver cancer Maternal Grandmother     Social History   Tobacco Use   Smoking status: Never   Smokeless tobacco: Never  Substance Use Topics   Alcohol use: No    Subjective:    I connected with LEXIA VANDEVENDER on 02/25/23 at 10:00 AM EST by a video enabled telemedicine application and verified that I am speaking with the correct person using two identifiers.   I discussed the limitations of evaluation and management by telemedicine and the availability of in person appointments. The patient expressed understanding and agreed to proceed. Provider in office/ patient is at home; provider and patient are only 2 people on video call.   Patient has been struggling with recurrent UTI; was treated in December with Levaquin and early Amoxicillin in early February; notes that symptoms returned last night; + burning, urgency; she is unable to get to her provider's office at this time as her daughter is in hospice home and patient wants to be with her as much as possible; no fever, no blood in urine;    Objective:  Vitals:   02/25/23 1008  Weight: 169 lb (76.7 kg)  Height: 5\' 6"  (1.676 m)    General: Well developed, well nourished, in no acute distress Skin : Warm and dry.  Head: Normocephalic and atraumatic  Lungs: Respirations unlabored;  Neurologic: Alert and oriented; speech intact; face symmetrical;   Assessment:  1. Urinary tract infection without hematuria, site unspecified   2. Acute cystitis without hematuria   3. Recurrent UTI     Plan:  Will go ahead and treat with Levaquin as requested due to complicated family matter preventing patient from being able to get to her provider's office; however did repeatedly stress the importance of following  up with her PCP in 7-10 days to get re-checked/ update urine sample and patient expresses understanding.  Patient is understandably unable to leave her daughter's side at this time and expressed my sympathy for her family. She understands to reach out to her PCP if she feels she needs help with anxiety support during this time also.   No follow-ups on file.  No orders of the defined types were placed in this encounter.   Requested Prescriptions   Signed Prescriptions Disp Refills   levofloxacin (LEVAQUIN) 500 MG tablet 7 tablet 0    Sig: Take 1 tablet (500 mg total) by mouth daily for 7 days.

## 2023-02-25 NOTE — Telephone Encounter (Signed)
 Appears patient was triaged and scheduled with provider for eval. No further call needed.

## 2023-02-25 NOTE — Telephone Encounter (Signed)
 Called pt back with attempt to triage r/t medication request: no answer: left voicemail requesting pt to call us back. Another attempt to reach out to pt will be provided at later time.

## 2023-02-25 NOTE — Telephone Encounter (Signed)
 Copied from CRM [id number]. Topic: Clinical - Medication Refill >> Feb 25, 2023  9:21 AM Higinio Roger wrote: Most Recent Primary Care Visit:  Provider: Windell Moment  Department: [facility]  Visit Type: ACUTE  Date: 01/31/2023  Medication: Levofloxacin  Has the patient contacted their pharmacy? No (Agent: If no, request that the patient contact the pharmacy for the refill. If patient does not wish to contact the pharmacy document the reason why and proceed with request.) Patient is requesting medication due to current antibiotic not working for UTI  (Agent: If yes, when and what did the pharmacy advise?)  Is this the correct pharmacy for this prescription? Yes If no, delete pharmacy and type the correct one.  This is the patient's preferred pharmacy:   [id number], [place] - [address] [place] [address] Phone: [phone] Fax: [phone]  Has the prescription been filled recently? No  Is the patient out of the medication?   Has the patient been seen for an appointment in the last year OR does the patient have an upcoming appointment? Yes  Can we respond through MyChart? No. Callback #: [phone]  Agent: Please be advised that Rx refills may take up to 3 business days. We ask that you follow-up with your pharmacy.

## 2023-02-25 NOTE — Telephone Encounter (Signed)
 Copied from CRM [id number]. Topic: Clinical - Red Word Triage >> Feb 25, 2023  9:03 AM Higinio Roger wrote: Patient has a UTI that has gotten worse. Symptoms: burning, hurting, frequent urination. Patient also requested Levofloxacin. A request has been put in. Reason for Disposition  Age > 50 years  Answer Assessment - Initial Assessment Questions 1. SEVERITY: "How bad is the pain?"  (e.g., Scale 1-10; mild, moderate, or severe)   - MILD (1-3): complains slightly about urination hurting   - MODERATE (4-7): interferes with normal activities     - SEVERE (8-10): excruciating, unwilling or unable to urinate because of the pain      Burning with urination, frequency.    I don't want to come in because my daughter is in Hospice.   Can I come in and pee in a cup?  Requesting Levofloxacin.    2. FREQUENCY: "How many times have you had painful urination today?"      Yes 3. PATTERN: "Is pain present every time you urinate or just sometimes?"      Yes 4. ONSET: "When did the painful urination start?"      Last night 5. FEVER: "Do you have a fever?" If Yes, ask: "What is your temperature, how was it measured, and when did it start?"     I don't feel like I am 6. PAST UTI: "Have you had a urine infection before?" If Yes, ask: "When was the last time?" and "What happened that time?"      Yes 7. CAUSE: "What do you think is causing the painful urination?"  (e.g., UTI, scratch, Herpes sore)     UTI 8. OTHER SYMPTOMS: "Do you have any other symptoms?" (e.g., blood in urine, flank pain, genital sores, urgency, vaginal discharge)     No flank pain.   There was a little clot in my urine this morning looked like a blood clot.   I'm constipated so much is reason I'm getting these UTIs.   I had back surgery recently and I'm on strong pain medication that causes constipation 9. PREGNANCY: "Is there any chance you are pregnant?" "When was your last menstrual period?"     N/A  Protocols used: Urination Pain -  Female-A-AH  Chief Complaint: UTI Symptoms: Burning and frequency with urination Frequency: Yes Pertinent Negatives: Patient denies fever or flank pain Disposition: [] ED /[] Urgent Care (no appt availability in office) / [x] Appointment(In office/virtual)/ []  [facility]/ [] Home Care/ [] Refused Recommended Disposition /[] [place] [facility]/ []  Follow-up with PCP Additional Notes: No appts with [facility] so appt made with Dr. Ria Clock as a video visit for today at 10:00.

## 2023-02-25 NOTE — Telephone Encounter (Signed)
 See triage note.

## 2023-02-26 ENCOUNTER — Other Ambulatory Visit: Payer: Self-pay

## 2023-03-10 ENCOUNTER — Other Ambulatory Visit (HOSPITAL_COMMUNITY): Payer: Self-pay | Admitting: Student

## 2023-03-10 DIAGNOSIS — S22080A Wedge compression fracture of T11-T12 vertebra, initial encounter for closed fracture: Secondary | ICD-10-CM | POA: Diagnosis not present

## 2023-03-10 DIAGNOSIS — S32010D Wedge compression fracture of first lumbar vertebra, subsequent encounter for fracture with routine healing: Secondary | ICD-10-CM | POA: Diagnosis not present

## 2023-03-17 DIAGNOSIS — H353221 Exudative age-related macular degeneration, left eye, with active choroidal neovascularization: Secondary | ICD-10-CM | POA: Diagnosis not present

## 2023-03-23 NOTE — Progress Notes (Unsigned)
 Subjective:  Patient ID: Virginia Montoya, female    DOB: [birth year]  Age: 73 y.o. MRN: [id number]  Chief Complaint  Patient presents with   Medical Management of Chronic Issues   Discussed the use of AI scribe software for clinical note transcription with the patient, who gave verbal consent to proceed.  History of Present Illness  Virginia Montoya is a 73 year old female with chronic back pain who presents with worsening pain and constipation.  She has chronic back pain that has worsened recently, with a history of vertebral fractures and previous vertebroplasty. The pain is located above and below the area where the cement was placed, described as feeling like her ribs are rubbing against her hip bone. Significant discomfort is noted in the right abdomen between the ribs and hip. The pain is persistent and does not fully resolve with medication, though it becomes bearable when she reclines. She avoids bending over, limiting activities such as gardening.  She experiences significant constipation, likely exacerbated by her pain medications. Her abdomen is described as distended and firm, and she had a small bowel movement the morning of the visit. She has been using Senokot and Miralax with limited success.  She is currently taking oxycodone at 9 AM, 3 PM, and 9 PM, sometimes skipping the middle dose. Gabapentin is taken at 300 mg three times a day, although she believes she is still taking 100 mg doses. She also takes Celebrex, methocarbamol, Effexor, lorazepam mostly twice a day, and Ambien for sleep. The pain medication causes significant constipation.  She has a history of diabetes and is on rosuvastatin for cholesterol, omeprazole for acid reflux, and B12 supplements. She mentions having cataract surgery three years ago and does not wear contacts. She has mild arthritis in her hips, which was noted on previous x-rays. She experiences pain in her hip, particularly when sitting, and has had x-rays  done, but they did not show significant findings at the time. She is awaiting an MRI scheduled for April 2nd to further evaluate her condition.      Osteoporosis. Prolia 60 mg every 6 months, Vitamin D 1000 U qd. Intolerant to calcium due to constipation.     GAD/Depression: on effexor xr 150 mg one daily in am. Lorazepam 0.5 mg 1 pill as needed during the day usually around 2 pm.  Patient takes Lorazepam 1 pill twice a day. Takes ambien at night for sleep.    Diabetes: Diagnosed in 08/2022. A1C 6.9. Limiting sugars. Not able to walk for exercise. Checking your feet daily.   GERD. On omeprazole 20 mg daily. Well-controlled.    Macular degeneration: sees eye doctor.    Hyperlipidemia: on crestor 20 mg before bed.  Eats fairly healthy and exercising.  Continued back and hip pain. Seeing [facility] for your spine.  Hip pain: scheduled to see [facility] on Christms eve.       03/24/2023    9:05 AM 12/24/2022   10:12 AM 08/23/2022    9:49 AM 05/10/2022    3:34 PM 05/03/2022    9:55 AM  Depression screen PHQ 2/9  Decreased Interest  0 0 0 0  Down, Depressed, Hopeless 3 0 0 0 0  PHQ - 2 Score 3 0 0 0 0  Altered sleeping 0 0 2 0 1  Tired, decreased energy 1 0 0 0 0  Change in appetite 0 0 2 0 0  Feeling bad or failure about yourself  0 0 0  0 0  Trouble concentrating 0 0 0 0 0  Moving slowly or fidgety/restless 0 0 0 0 0  Suicidal thoughts 0 0 0 0 0  PHQ-9 Score 4 0 4 0 1  Difficult doing work/chores Somewhat difficult Not difficult at all Not difficult at all Not difficult at all Not difficult at all        03/24/2023    9:04 AM  Fall Risk   Falls in the past year? 0  Number falls in past yr: 0  Injury with Fall? 0  Risk for fall due to : No Fall Risks  Follow up Falls evaluation completed    Patient Care Team: Blane Ohara, MD as PCP - General (Family Medicine)   Review of Systems  Constitutional:  Negative for appetite change, fatigue and fever.   HENT:  Negative for congestion, ear pain, sinus pressure and sore throat.   Respiratory:  Negative for cough, chest tightness, shortness of breath and wheezing.   Cardiovascular:  Negative for chest pain and palpitations.  Gastrointestinal:  Negative for abdominal pain, constipation, diarrhea, nausea and vomiting.  Genitourinary:  Negative for dysuria and hematuria.  Musculoskeletal:  Positive for back pain (Chronic). Negative for arthralgias, joint swelling and myalgias.  Skin:  Negative for rash.  Neurological:  Negative for dizziness, weakness and headaches.  Psychiatric/Behavioral:  Negative for dysphoric mood. The patient is not nervous/anxious.     Current Outpatient Medications on File Prior to Visit  Medication Sig Dispense Refill   celecoxib (CELEBREX) 200 MG capsule TAKE 1 CAPSULE(200 MG) BY MOUTH DAILY 30 capsule 1   cholecalciferol (VITAMIN D3) 25 MCG (1000 UNIT) tablet Take 1,000 Units by mouth 2 (two) times daily.     diclofenac Sodium (VOLTAREN) 1 % GEL 4 gms four times a day as needed hip pain 350 g 1   famotidine (PEPCID) 20 MG tablet Take 1 tablet (20 mg total) by mouth 2 (two) times daily. 180 tablet 3   fluticasone (FLONASE) 50 MCG/ACT nasal spray SHAKE LIQUID AND USE 2 SPRAYS IN EACH NOSTRIL DAILY 16 g 6   gabapentin (NEURONTIN) 100 MG capsule TAKE 1 CAPSULE(100 MG) BY MOUTH THREE TIMES DAILY 90 capsule 1   methocarbamol (ROBAXIN) 500 MG tablet Take 500 mg by mouth 4 (four) times daily.     Multiple Vitamins-Minerals (PRESERVISION AREDS 2 PO) Take by mouth.     omeprazole (PRILOSEC) 20 MG capsule TAKE 1 CAPSULE(20 MG) BY MOUTH DAILY 90 capsule 1   oxyCODONE-acetaminophen (PERCOCET/ROXICET) 5-325 MG tablet Take 1 tablet by mouth every 6 (six) hours as needed.     PROLIA 60 MG/ML SOSY injection Inject into the skin.     rosuvastatin (CRESTOR) 20 MG tablet TAKE 1 TABLET(20 MG) BY MOUTH AT BEDTIME 90 tablet 0   venlafaxine XR (EFFEXOR-XR) 150 MG 24 hr capsule TAKE 1  CAPSULE BY MOUTH DAILY 90 capsule 1   vitamin B-12 (CYANOCOBALAMIN) 1000 MCG tablet Take 1,000 mcg by mouth daily.     zolpidem (AMBIEN) 10 MG tablet TAKE 1 TABLET BY MOUTH AT BEDTIME 90 tablet 1   No current facility-administered medications on file prior to visit.   Past Medical History:  Diagnosis Date   Chronic insomnia    Depression    Gastroesophageal reflux disease    Hyperlipidemia    Osteoporosis    Precordial chest pain 11/17/2016   Prediabetes    Past Surgical History:  Procedure Laterality Date   CHOLECYSTECTOMY  1999   CORONARY CALCIUM  SCORE/CORONARY CTA  01/2017   Coronary calcium score is 0.  Normal coronary origin with normal right dominance.  No evidence of CAD.  Most likely noncardiac chest pain.   IR KYPHO LUMBAR INC FX REDUCE BONE BX UNI/BIL CANNULATION INC/IMAGING  01/14/2023   IR KYPHO THORACIC WITH BONE BIOPSY  01/14/2023   SHOULDER SURGERY  2005   TONSILLECTOMY     TUBAL LIGATION  1977    Family History  Problem Relation Age of Onset   Diabetes Mother    Hyperlipidemia Mother    Emphysema Father    Diabetes Maternal Grandmother    Liver cancer Maternal Grandmother    Social History   Socioeconomic History   Marital status: Married    Spouse name: Lyman Bishop   Number of children: 3   Years of education: 12   Highest education level: Associate degree: academic program  Occupational History   Occupation: Retired  Tobacco Use   Smoking status: Never   Smokeless tobacco: Never  Vaping Use   Vaping status: Never Used  Substance and Sexual Activity   Alcohol use: No   Drug use: No   Sexual activity: Not Currently  Other Topics Concern   Not on file  Social History Narrative   She is a married (remarried) mother of 3 with 4 grandchildren.  She has 3 great-grandchildren.  She previously worked at [employer] in the [employer].  She completed high school and beauty school.   She works outside a lot in the yard on gardening.  She does a lot of  walking and exercises with walking for at least a half an hour a day 5 days a week.   wears sunscreen, brushes and flosses daily, see's dentist bi-annually, has smoke/carbon monoxide detectors, wears a seatbelt and practices gun safety   Social Drivers of Health   Financial Resource Strain: Low Risk  (05/07/2021)   Overall Financial Resource Strain (CARDIA)    Difficulty of Paying Living Expenses: Not hard at all  Food Insecurity: No Food Insecurity (05/10/2022)   Hunger Vital Sign    Worried About Running Out of Food in the Last Year: Never true    Ran Out of Food in the Last Year: Never true  Transportation Needs: No Transportation Needs (05/10/2022)   PRAPARE - Administrator, Civil Service (Medical): No    Lack of Transportation (Non-Medical): No  Physical Activity: Inactive (08/23/2022)   Exercise Vital Sign    Days of Exercise per Week: 0 days    Minutes of Exercise per Session: 0 min  Stress: No Stress Concern Present (05/10/2022)   [employer] of Occupational Health - Occupational Stress Questionnaire    Feeling of Stress : Only a little  Social Connections: Moderately Integrated (05/07/2021)   Social Connection and Isolation Panel [NHANES]    Frequency of Communication with Friends and Family: More than three times a week    Frequency of Social Gatherings with Friends and Family: Three times a week    Attends Religious Services: More than 4 times per year    Active Member of Clubs or Organizations: No    Attends Banker Meetings: Never    Marital Status: Married    Objective:  BP (!) 168/90 (BP Location: Left Arm, Patient Position: Sitting)   Pulse 84   Temp 97.9 F (36.6 C) (Temporal)   Ht 5\' 6"  (1.676 m)   Wt 162 lb (73.5 kg)   LMP  (LMP Unknown)  SpO2 98%   BMI 26.15 kg/m      03/24/2023   12:26 PM 03/24/2023    9:18 AM 02/25/2023   10:08 AM  BP/Weight  Systolic BP 168 158   Diastolic BP 90 90   Wt. (Lbs)  162 169  BMI  26.15 kg/m2  27.28 kg/m2    Physical Exam Vitals reviewed.  Constitutional:      Appearance: Normal appearance. She is normal weight.  Neck:     Vascular: No carotid bruit.  Cardiovascular:     Rate and Rhythm: Normal rate and regular rhythm.     Heart sounds: Normal heart sounds.  Pulmonary:     Effort: Pulmonary effort is normal. No respiratory distress.     Breath sounds: Normal breath sounds.  Abdominal:     General: Abdomen is flat. Bowel sounds are normal.     Palpations: Abdomen is soft.     Tenderness: There is no abdominal tenderness.  Musculoskeletal:        General: Tenderness (asis of rt hip.) present.  Neurological:     Mental Status: She is alert and oriented to person, place, and time.  Psychiatric:        Mood and Affect: Mood normal.        Behavior: Behavior normal.     Diabetic Foot Exam - Simple   No data filed      Lab Results  Component Value Date   WBC 5.8 03/24/2023   HGB 15.0 03/24/2023   HCT 44.6 03/24/2023   PLT 339 03/24/2023   GLUCOSE CANCELED 03/24/2023   CHOL 148 03/24/2023   TRIG 148 03/24/2023   HDL 53 03/24/2023   LDLCALC 70 03/24/2023   ALT 14 03/24/2023   AST 22 03/24/2023   NA 142 03/24/2023   K CANCELED 03/24/2023   CL 101 03/24/2023   CREATININE 0.50 (L) 03/24/2023   BUN 8 03/24/2023   CO2 21 03/24/2023   TSH 2.010 05/03/2022   INR 1.0 01/14/2023   HGBA1C 6.4 (H) 12/24/2022   MICROALBUR 10 05/29/2020      Assessment & Plan:  Assessment and Plan   Vertebral Fracture She has a new lower end plate vertebral fracture causing severe, persistent back pain affecting daily activities. Vertebral cement augmentation alleviated pain in the treated area, but pain persists above and below the treated site. Pain is bearable with oxycodone and gabapentin but never fully resolves. Awaiting MRI scheduled for April 2 for further evaluation. She is under the care of a PA at the spine center. - Continue oxycodone and gabapentin as prescribed for  pain management. - Await MRI results scheduled for April 2. - Consider alternative gardening methods to avoid bending, such as raised beds or pots on a table.  Constipation Severe constipation likely exacerbated by opioid use (oxycodone) and possibly methocarbamol. Reports a distended and hard abdomen. Current management with Senokot and Miralax has been insufficient. Increasing Miralax to twice daily is recommended, and a prescription-strength medication for constipation is considered, pending insurance approval. - Increase Miralax to twice daily. - Continue Senokot as prescribed. - Prescribe a prescription-strength medication for constipation, pending insurance approval.  Diabetes Mellitus Diagnosed with diabetes mellitus. Concerns about dietary habits, as she reports frequent consumption of popcorn and ginger ale. Physical activity is limited due to back pain, impacting glycemic control. - Recheck A1c in three months. - Encourage dietary modifications to improve glycemic control.  Mild Hip Arthritis Mild arthritis in the hips confirmed by previous x-rays.  Pain is not significantly relieved by current medications and worsens throughout the day. No significant findings from recent x-rays, but monitoring is advised. - Consider further evaluation if pain worsens.  General Health Maintenance On multiple medications for chronic conditions, including rosuvastatin for hyperlipidemia, omeprazole for acid reflux, and B12 supplementation. History of cataract surgery and uses reading glasses occasionally. - Continue rosuvastatin 20 mg daily. - Continue omeprazole as prescribed. - Continue B12 supplementation as prescribed.  Follow-up Scheduled for an MRI on April 2 to assess the vertebral fracture. Blood work, including A1c, is planned for three months later to monitor diabetes management. - Follow up after MRI results to discuss further management. - Schedule blood work, including A1c, in three  months.        Hypertension associated with type 2 diabetes mellitus (HCC) Assessment & Plan: Diagnosed with diabetes mellitus. Concerns about dietary habits, as she reports frequent consumption of popcorn and ginger ale. Physical activity is limited due to back pain, impacting glycemic control. - Recheck A1c in three months. - Encourage dietary modifications to improve glycemic control.  Orders: -     CBC with Differential/Platelet -     Comprehensive metabolic panel -     Microalbumin / creatinine urine ratio -     POCT URINALYSIS DIP (CLINITEK)  Gastroesophageal reflux disease without esophagitis Assessment & Plan: The current medical regimen is effective;  continue present plan and medications. Continue omeprazole.   Idiopathic peripheral neuropathy Assessment & Plan: Controlled Continue current medication regimen. Gabapentin 100 mg by mouth THREE TIMES A DAY    Mixed hyperlipidemia -     Lipid panel  Mild recurrent major depression (HCC)  GAD (generalized anxiety disorder)  Vertebral fracture, osteoporotic, sequela Assessment & Plan: She has a new lower end plate vertebral fracture causing severe, persistent back pain affecting daily activities. Vertebral cement augmentation alleviated pain in the treated area, but pain persists above and below the treated site. Pain is bearable with oxycodone and gabapentin but never fully resolves. Awaiting MRI scheduled for April 2 for further evaluation. She is under the care of a PA at the spine center. - Continue oxycodone and gabapentin as prescribed for pain management. - Await MRI results scheduled for April 2. - Consider alternative gardening methods to avoid bending, such as raised beds or pots on a table.   Bilateral hip pain Assessment & Plan: Mild arthritis in the hips confirmed by previous x-rays. Pain is not significantly relieved by current medications and worsens throughout the day. No significant findings from  recent x-rays, but monitoring is advised. - Consider further evaluation if pain worsens.   Chronic idiopathic constipation Assessment & Plan: Severe constipation likely exacerbated by opioid use (oxycodone) and possibly methocarbamol. Reports a distended and hard abdomen. Current management with Senokot and Miralax has been insufficient. Increasing Miralax to twice daily is recommended, and a prescription-strength medication for constipation is considered, pending insurance approval. - Increase Miralax to twice daily. - Continue Senokot as prescribed. - Prescribe a prescription-strength medication for constipation, pending insurance approval.   Other orders -     Lubiprostone; Take 1 capsule (24 mcg total) by mouth 2 (two) times daily with a meal.  Dispense: 60 capsule; Refill: 2 -     Valsartan; Take 1 tablet (40 mg total) by mouth daily.  Dispense: 90 tablet; Refill: 0     Meds ordered this encounter  Medications   lubiprostone (AMITIZA) 24 MCG capsule    Sig: Take 1 capsule (24 mcg  total) by mouth 2 (two) times daily with a meal.    Dispense:  60 capsule    Refill:  2   valsartan (DIOVAN) 40 MG tablet    Sig: Take 1 tablet (40 mg total) by mouth daily.    Dispense:  90 tablet    Refill:  0    Orders Placed This Encounter  Procedures   CBC with Differential/Platelet   Comprehensive metabolic panel   Lipid panel   Microalbumin / creatinine urine ratio   POCT URINALYSIS DIP (CLINITEK)     Follow-up: No follow-ups on file.   I,Marla I Leal-Borjas,acting as a scribe for Blane Ohara, MD.,have documented all relevant documentation on the behalf of Blane Ohara, MD,as directed by  Blane Ohara, MD while in the presence of Blane Ohara, MD.   An After Visit Summary was printed and given to the patient.  Blane Ohara, MD [facility] [phone]

## 2023-03-24 ENCOUNTER — Encounter: Payer: Self-pay | Admitting: Family Medicine

## 2023-03-24 ENCOUNTER — Ambulatory Visit (INDEPENDENT_AMBULATORY_CARE_PROVIDER_SITE_OTHER): Payer: Medicare HMO | Admitting: Family Medicine

## 2023-03-24 VITALS — BP 168/90 | HR 84 | Temp 97.9°F | Ht 66.0 in | Wt 162.0 lb

## 2023-03-24 DIAGNOSIS — I152 Hypertension secondary to endocrine disorders: Secondary | ICD-10-CM | POA: Diagnosis not present

## 2023-03-24 DIAGNOSIS — E1159 Type 2 diabetes mellitus with other circulatory complications: Secondary | ICD-10-CM | POA: Diagnosis not present

## 2023-03-24 DIAGNOSIS — K219 Gastro-esophageal reflux disease without esophagitis: Secondary | ICD-10-CM

## 2023-03-24 DIAGNOSIS — M25551 Pain in right hip: Secondary | ICD-10-CM

## 2023-03-24 DIAGNOSIS — F330 Major depressive disorder, recurrent, mild: Secondary | ICD-10-CM | POA: Diagnosis not present

## 2023-03-24 DIAGNOSIS — M8008XS Age-related osteoporosis with current pathological fracture, vertebra(e), sequela: Secondary | ICD-10-CM

## 2023-03-24 DIAGNOSIS — K5903 Drug induced constipation: Secondary | ICD-10-CM | POA: Diagnosis not present

## 2023-03-24 DIAGNOSIS — T402X5A Adverse effect of other opioids, initial encounter: Secondary | ICD-10-CM

## 2023-03-24 DIAGNOSIS — F411 Generalized anxiety disorder: Secondary | ICD-10-CM

## 2023-03-24 DIAGNOSIS — G609 Hereditary and idiopathic neuropathy, unspecified: Secondary | ICD-10-CM | POA: Diagnosis not present

## 2023-03-24 DIAGNOSIS — E782 Mixed hyperlipidemia: Secondary | ICD-10-CM | POA: Diagnosis not present

## 2023-03-24 DIAGNOSIS — R7309 Other abnormal glucose: Secondary | ICD-10-CM | POA: Diagnosis not present

## 2023-03-24 DIAGNOSIS — K5904 Chronic idiopathic constipation: Secondary | ICD-10-CM

## 2023-03-24 LAB — POCT URINALYSIS DIP (CLINITEK)
Bilirubin, UA: NEGATIVE
Blood, UA: NEGATIVE
Glucose, UA: NEGATIVE mg/dL
Ketones, POC UA: NEGATIVE mg/dL
Leukocytes, UA: NEGATIVE
Nitrite, UA: NEGATIVE
POC PROTEIN,UA: NEGATIVE
Spec Grav, UA: 1.02 (ref 1.010–1.025)
Urobilinogen, UA: NEGATIVE U/dL
pH, UA: 6 (ref 5.0–8.0)

## 2023-03-24 MED ORDER — VALSARTAN 40 MG PO TABS: 40.0000 mg | ORAL_TABLET | Freq: Every day | ORAL | 0 refills | Status: DC

## 2023-03-24 MED ORDER — LUBIPROSTONE 24 MCG PO CAPS: 24.0000 ug | ORAL_CAPSULE | Freq: Two times a day (BID) | ORAL | 2 refills | Status: DC

## 2023-03-24 NOTE — Patient Instructions (Signed)
 VISIT SUMMARY:  Today, we discussed your chronic back pain, constipation, diabetes management, and mild hip arthritis. We reviewed your current medications and made some adjustments to help manage your symptoms better. We also talked about your upcoming MRI and follow-up plans.  YOUR PLAN: CHRONIC BACK PAIN: Continue current medications. Continue management with [facility].   -CONSTIPATION: Your constipation is likely worsened by your pain medications. We recommend increasing Miralax to twice daily and continuing Senokot. I will also prescribe a stronger medication called Amitiza for constipation, pending insurance approval. Once approved, recommend stop miralax and just take amitiza 24 mcg one twice daily.   -DIABETES MELLITUS: You have diabetes, which means your blood sugar levels are too high. We will recheck your A1c in three months and encourage you to make dietary changes to help control your blood sugar.  -MILD HIP ARTHRITIS: You have mild arthritis in your hips, which is causing pain. Arthritis is the swelling and tenderness of one or more of your joints. We will monitor your condition and consider further evaluation if the pain worsens.  -GENERAL HEALTH MAINTENANCE: Continue taking your medications for cholesterol, acid reflux, and B12 supplementation as prescribed. These medications help manage your chronic conditions and maintain your overall health.  INSTRUCTIONS:  Please follow up after your MRI results to discuss further management. Schedule blood work, including A1c, in three months to monitor your diabetes.

## 2023-03-25 ENCOUNTER — Other Ambulatory Visit: Payer: Self-pay | Admitting: Family Medicine

## 2023-03-25 DIAGNOSIS — F411 Generalized anxiety disorder: Secondary | ICD-10-CM

## 2023-03-25 LAB — CBC WITH DIFFERENTIAL/PLATELET
Basophils Absolute: 0 10*3/uL (ref 0.0–0.2)
Basos: 1 %
EOS (ABSOLUTE): 0.1 10*3/uL (ref 0.0–0.4)
Eos: 2 %
Hematocrit: 44.6 % (ref 34.0–46.6)
Hemoglobin: 15 g/dL (ref 11.1–15.9)
Immature Grans (Abs): 0 10*3/uL (ref 0.0–0.1)
Immature Granulocytes: 0 %
Lymphocytes Absolute: 2.1 10*3/uL (ref 0.7–3.1)
Lymphs: 37 %
MCH: 29.6 pg (ref 26.6–33.0)
MCHC: 33.6 g/dL (ref 31.5–35.7)
MCV: 88 fL (ref 79–97)
Monocytes Absolute: 0.5 10*3/uL (ref 0.1–0.9)
Monocytes: 9 %
Neutrophils Absolute: 3 10*3/uL (ref 1.4–7.0)
Neutrophils: 51 %
Platelets: 339 10*3/uL (ref 150–450)
RBC: 5.06 x10E6/uL (ref 3.77–5.28)
RDW: 13.9 % (ref 11.7–15.4)
WBC: 5.8 10*3/uL (ref 3.4–10.8)

## 2023-03-25 LAB — COMPREHENSIVE METABOLIC PANEL
ALT: 14 IU/L (ref 0–32)
AST: 22 IU/L (ref 0–40)
Albumin: 4.4 g/dL (ref 3.8–4.8)
Alkaline Phosphatase: 85 IU/L (ref 44–121)
BUN/Creatinine Ratio: 16 (ref 12–28)
BUN: 8 mg/dL (ref 8–27)
Bilirubin Total: 0.4 mg/dL (ref 0.0–1.2)
CO2: 21 mmol/L (ref 20–29)
Calcium: 8.6 mg/dL — ABNORMAL LOW (ref 8.7–10.3)
Chloride: 101 mmol/L (ref 96–106)
Creatinine, Ser: 0.5 mg/dL — ABNORMAL LOW (ref 0.57–1.00)
Globulin, Total: 2.4 g/dL (ref 1.5–4.5)
Sodium: 142 mmol/L (ref 134–144)
Total Protein: 6.8 g/dL (ref 6.0–8.5)
eGFR: 98 mL/min/{1.73_m2} (ref >59)

## 2023-03-25 LAB — LIPID PANEL
Chol/HDL Ratio: 2.8 ratio (ref 0.0–4.4)
Cholesterol, Total: 148 mg/dL (ref 100–199)
HDL: 53 mg/dL (ref >39)
LDL Chol Calc (NIH): 70 mg/dL (ref 0–99)
Triglycerides: 148 mg/dL (ref 0–149)
VLDL Cholesterol Cal: 25 mg/dL (ref 5–40)

## 2023-03-25 LAB — MICROALBUMIN / CREATININE URINE RATIO
Creatinine, Urine: 49.7 mg/dL
Microalb/Creat Ratio: 32 mg/g{creat} — ABNORMAL HIGH (ref 0–29)
Microalbumin, Urine: 15.7 ug/mL

## 2023-03-26 DIAGNOSIS — K5904 Chronic idiopathic constipation: Secondary | ICD-10-CM | POA: Insufficient documentation

## 2023-03-26 DIAGNOSIS — K5903 Drug induced constipation: Secondary | ICD-10-CM | POA: Insufficient documentation

## 2023-03-26 NOTE — Assessment & Plan Note (Signed)
 Mild arthritis in the hips confirmed by previous x-rays. Pain is not significantly relieved by current medications and worsens throughout the day. No significant findings from recent x-rays, but monitoring is advised. - Consider further evaluation if pain worsens.

## 2023-03-26 NOTE — Assessment & Plan Note (Signed)
 Severe constipation likely exacerbated by opioid use (oxycodone) and possibly methocarbamol. Reports a distended and hard abdomen. Current management with Senokot and Miralax has been insufficient. Increasing Miralax to twice daily is recommended, and a prescription-strength medication for constipation is considered, pending insurance approval. - Increase Miralax to twice daily. - Continue Senokot as prescribed. - Prescribe a prescription-strength medication for constipation, pending insurance approval.

## 2023-03-26 NOTE — Assessment & Plan Note (Signed)
 The current medical regimen is effective;  continue present plan and medications. Continue omeprazole 20 mg daily.

## 2023-03-26 NOTE — Assessment & Plan Note (Signed)
 Diagnosed with diabetes mellitus. Concerns about dietary habits, as she reports frequent consumption of popcorn and ginger ale. Physical activity is limited due to back pain, impacting glycemic control. - Recheck A1c in three months. - Encourage dietary modifications to improve glycemic control.

## 2023-03-26 NOTE — Assessment & Plan Note (Signed)
 Controlled Continue current medication regimen. Gabapentin 100 mg by mouth THREE TIMES A DAY

## 2023-03-26 NOTE — Assessment & Plan Note (Signed)
 She has a new lower end plate vertebral fracture causing severe, persistent back pain affecting daily activities. Vertebral cement augmentation alleviated pain in the treated area, but pain persists above and below the treated site. Pain is bearable with oxycodone and gabapentin but never fully resolves. Awaiting MRI scheduled for April 2 for further evaluation. She is under the care of a PA at the spine center. - Continue oxycodone and gabapentin as prescribed for pain management. - Await MRI results scheduled for April 2. - Consider alternative gardening methods to avoid bending, such as raised beds or pots on a table.

## 2023-03-27 ENCOUNTER — Encounter: Payer: Self-pay | Admitting: Family Medicine

## 2023-03-27 NOTE — Assessment & Plan Note (Signed)
 Well controlled.  Continue on current medicaitons.  Continue on effexor xr 150 mg one daily in am. Lorazepam 0.5 mg 1 pill as needed during the day and patient takes 1 pill in evening. Takes ambien at night for sleep.

## 2023-03-27 NOTE — Assessment & Plan Note (Signed)
Well controlled.  No changes to medicines. Continue crestor 20 mg before bed.  Continue to work on eating a healthy diet and exercise.  Labs drawn today.   

## 2023-03-27 NOTE — Assessment & Plan Note (Signed)
 The current medical regimen is effective;  continue present plan and medications. Continue on effexor xr 150 mg one daily in am. Lorazepam 0.5 mg 1 pill twice a day and one as needed during the day. She usually only takes Lorazepam 1 pill twice a day. Takes ambien at night for sleep.  Grieving, but does not need wish to change any medicines.  Recommend grief counseling.

## 2023-03-27 NOTE — Assessment & Plan Note (Signed)
>>  ASSESSMENT AND PLAN FOR MILD RECURRENT MAJOR DEPRESSION (HCC) WRITTEN ON 03/27/2023  7:55 PM BY Naira Standiford, MD  The current medical regimen is effective;  continue present plan and medications. Continue on effexor  xr 150 mg one daily in am. Lorazepam  0.5 mg 1 pill twice a day and one as needed during the day. She usually only takes Lorazepam  1 pill twice a day. Takes ambien at night for sleep.  Grieving, but does not need wish to change any medicines.  Recommend grief counseling.

## 2023-04-05 ENCOUNTER — Ambulatory Visit: Payer: Medicare HMO | Admitting: Family Medicine

## 2023-04-05 LAB — SPECIMEN STATUS REPORT

## 2023-04-05 LAB — HGB A1C W/O EAG: Hgb A1c MFr Bld: 6.1 % — ABNORMAL HIGH (ref 4.8–5.6)

## 2023-04-06 ENCOUNTER — Ambulatory Visit (HOSPITAL_BASED_OUTPATIENT_CLINIC_OR_DEPARTMENT_OTHER)
Admission: RE | Admit: 2023-04-06 | Discharge: 2023-04-06 | Disposition: A | Discharge status: Home / Self Care | Source: Ambulatory Visit | Attending: Student | Admitting: Radiology

## 2023-04-06 DIAGNOSIS — R29890 Loss of height: Secondary | ICD-10-CM | POA: Diagnosis not present

## 2023-04-06 DIAGNOSIS — S32010D Wedge compression fracture of first lumbar vertebra, subsequent encounter for fracture with routine healing: Secondary | ICD-10-CM

## 2023-04-06 DIAGNOSIS — M48061 Spinal stenosis, lumbar region without neurogenic claudication: Secondary | ICD-10-CM | POA: Diagnosis not present

## 2023-04-06 DIAGNOSIS — M4856XA Collapsed vertebra, not elsewhere classified, lumbar region, initial encounter for fracture: Secondary | ICD-10-CM | POA: Diagnosis not present

## 2023-04-06 DIAGNOSIS — M47816 Spondylosis without myelopathy or radiculopathy, lumbar region: Secondary | ICD-10-CM | POA: Diagnosis not present

## 2023-04-11 ENCOUNTER — Ambulatory Visit: Admitting: Physician Assistant

## 2023-04-19 ENCOUNTER — Other Ambulatory Visit: Payer: Self-pay | Admitting: Family Medicine

## 2023-04-19 DIAGNOSIS — K219 Gastro-esophageal reflux disease without esophagitis: Secondary | ICD-10-CM

## 2023-04-21 DIAGNOSIS — H353221 Exudative age-related macular degeneration, left eye, with active choroidal neovascularization: Secondary | ICD-10-CM | POA: Diagnosis not present

## 2023-04-24 ENCOUNTER — Other Ambulatory Visit: Payer: Self-pay | Admitting: Family Medicine

## 2023-04-25 ENCOUNTER — Ambulatory Visit: Admitting: Family Medicine

## 2023-04-25 ENCOUNTER — Other Ambulatory Visit: Payer: Self-pay

## 2023-04-26 DIAGNOSIS — S32020D Wedge compression fracture of second lumbar vertebra, subsequent encounter for fracture with routine healing: Secondary | ICD-10-CM | POA: Diagnosis not present

## 2023-04-26 DIAGNOSIS — Z6826 Body mass index (BMI) 26.0-26.9, adult: Secondary | ICD-10-CM | POA: Diagnosis not present

## 2023-04-27 NOTE — Progress Notes (Signed)
 Subjective:  Patient ID: Virginia Montoya, female    DOB: Dec 30, 1950  Age: 73 y.o. MRN: [id number]  Chief Complaint  Patient presents with   Hypertension    HPI: Presents for a 1 month follow up for hypertension. States she had chest pain 3 weeks ago. At rest. Chest pressure radiated to her jaw and to the back. No dypsnea, diaphoreses, or nausea.  Lasted about 15 minutes. Has had smaller episodes that were not as intense or lasted as long. Started on valsartan  40 mg once daily at her last visit.   Depression improving. Still grieving loss of her daughter.      04/28/2023    2:33 PM 03/24/2023    9:05 AM 12/24/2022   10:12 AM 08/23/2022    9:49 AM 05/10/2022    3:34 PM  Depression screen PHQ 2/9  Decreased Interest 1  0 0 0  Down, Depressed, Hopeless 1 3 0 0 0  PHQ - 2 Score 2 3 0 0 0  Altered sleeping 0 0 0 2 0  Tired, decreased energy 3 1 0 0 0  Change in appetite 0 0 0 2 0  Feeling bad or failure about yourself  0 0 0 0 0  Trouble concentrating 0 0 0 0 0  Moving slowly or fidgety/restless 1 0 0 0 0  Suicidal thoughts 0 0 0 0 0  PHQ-9 Score 6 4 0 4 0  Difficult doing work/chores Somewhat difficult Somewhat difficult Not difficult at all Not difficult at all Not difficult at all        03/24/2023    9:04 AM  Fall Risk   Falls in the past year? 0  Number falls in past yr: 0  Injury with Fall? 0  Risk for fall due to : No Fall Risks  Follow up Falls evaluation completed    Patient Care Team: Mercy Stall, MD as PCP - General (Family Medicine)   Review of Systems  Constitutional:  Negative for chills, fatigue and fever.  HENT:  Negative for congestion, ear pain, rhinorrhea and sore throat.   Respiratory:  Negative for cough and shortness of breath.   Cardiovascular:  Negative for chest pain.  Gastrointestinal:  Negative for abdominal pain, constipation, diarrhea, nausea and vomiting.  Genitourinary:  Negative for dysuria and urgency.  Musculoskeletal:  Negative for back  pain and myalgias.  Neurological:  Negative for dizziness, weakness, light-headedness and headaches.  Psychiatric/Behavioral:  Negative for dysphoric mood. The patient is not nervous/anxious.     Current Outpatient Medications on File Prior to Visit  Medication Sig Dispense Refill   celecoxib  (CELEBREX ) 200 MG capsule TAKE 1 CAPSULE(200 MG) BY MOUTH DAILY 30 capsule 1   cholecalciferol (VITAMIN D3) 25 MCG (1000 UNIT) tablet Take 1,000 Units by mouth 2 (two) times daily.     diclofenac  Sodium (VOLTAREN ) 1 % GEL 4 gms four times a day as needed hip pain 350 g 1   famotidine  (PEPCID ) 20 MG tablet TAKE 1 TABLET(20 MG) BY MOUTH TWICE DAILY 180 tablet 3   fluticasone  (FLONASE ) 50 MCG/ACT nasal spray SHAKE LIQUID AND USE 2 SPRAYS IN EACH NOSTRIL DAILY 16 g 6   gabapentin  (NEURONTIN ) 100 MG capsule TAKE 1 CAPSULE(100 MG) BY MOUTH THREE TIMES DAILY 90 capsule 1   LORazepam  (ATIVAN ) 0.5 MG tablet TAKE 1 TABLET(0.5 MG) BY MOUTH EVERY 8 HOURS AS NEEDED FOR ANXIETY 90 tablet 2   lubiprostone  (AMITIZA ) 24 MCG capsule Take 1 capsule (24 mcg total)  by mouth 2 (two) times daily with a meal. 60 capsule 2   methocarbamol  (ROBAXIN ) 500 MG tablet Take 500 mg by mouth 4 (four) times daily.     Multiple Vitamins-Minerals (PRESERVISION AREDS 2 PO) Take by mouth.     omeprazole  (PRILOSEC) 20 MG capsule TAKE 1 CAPSULE(20 MG) BY MOUTH DAILY 90 capsule 1   oxyCODONE -acetaminophen  (PERCOCET/ROXICET) 5-325 MG tablet Take 1 tablet by mouth every 6 (six) hours as needed.     PROLIA  60 MG/ML SOSY injection Inject into the skin.     rosuvastatin  (CRESTOR ) 20 MG tablet TAKE 1 TABLET(20 MG) BY MOUTH AT BEDTIME 90 tablet 0   valsartan  (DIOVAN ) 40 MG tablet Take 1 tablet (40 mg total) by mouth daily. 90 tablet 0   venlafaxine  XR (EFFEXOR -XR) 150 MG 24 hr capsule TAKE 1 CAPSULE BY MOUTH DAILY 90 capsule 1   vitamin B-12 (CYANOCOBALAMIN) 1000 MCG tablet Take 1,000 mcg by mouth daily.     zolpidem (AMBIEN) 10 MG tablet TAKE 1 TABLET  BY MOUTH AT BEDTIME 90 tablet 1   No current facility-administered medications on file prior to visit.   Past Medical History:  Diagnosis Date   Chronic insomnia    Depression    Gastroesophageal reflux disease    Hyperlipidemia    Osteoporosis    Precordial chest pain 11/17/2016   Prediabetes    Past Surgical History:  Procedure Laterality Date   CHOLECYSTECTOMY  1999   CORONARY CALCIUM  SCORE/CORONARY CTA  01/2017   Coronary calcium  score is 0.  Normal coronary origin with normal right dominance.  No evidence of CAD.  Most likely noncardiac chest pain.   IR KYPHO LUMBAR INC FX REDUCE BONE BX UNI/BIL CANNULATION INC/IMAGING  01/14/2023   IR KYPHO THORACIC WITH BONE BIOPSY  01/14/2023   SHOULDER SURGERY  2005   TONSILLECTOMY     TUBAL LIGATION  1977    Family History  Problem Relation Age of Onset   Diabetes Mother    Hyperlipidemia Mother    Emphysema Father    Diabetes Maternal Grandmother    Liver cancer Maternal Grandmother    Social History   Socioeconomic History   Marital status: Married    Spouse name: Salena Craven   Number of children: 3   Years of education: 12   Highest education level: Associate degree: academic program  Occupational History   Occupation: Retired  Tobacco Use   Smoking status: Never   Smokeless tobacco: Never  Vaping Use   Vaping status: Never Used  Substance and Sexual Activity   Alcohol use: No   Drug use: No   Sexual activity: Not Currently  Other Topics Concern   Not on file  Social History Narrative   She is a married (remarried) mother of 3 with 4 grandchildren.  She has 3 great-grandchildren.  She previously worked at [employer] in the [employer].  She completed high school and beauty school.   She works outside a lot in the yard on gardening.  She does a lot of walking and exercises with walking for at least a half an hour a day 5 days a week.   wears sunscreen, brushes and flosses daily, see's dentist bi-annually, has  smoke/carbon monoxide detectors, wears a seatbelt and practices gun safety   Social Drivers of Health   Financial Resource Strain: Low Risk  (05/07/2021)   Overall Financial Resource Strain (CARDIA)    Difficulty of Paying Living Expenses: Not hard at all  Food Insecurity: No  Food Insecurity (05/10/2022)   Hunger Vital Sign    Worried About Running Out of Food in the Last Year: Never true    Ran Out of Food in the Last Year: Never true  Transportation Needs: No Transportation Needs (05/10/2022)   PRAPARE - Administrator, Civil Service (Medical): No    Lack of Transportation (Non-Medical): No  Physical Activity: Inactive (08/23/2022)   Exercise Vital Sign    Days of Exercise per Week: 0 days    Minutes of Exercise per Session: 0 min  Stress: No Stress Concern Present (05/10/2022)   [employer] of Occupational Health - Occupational Stress Questionnaire    Feeling of Stress : Only a little  Social Connections: Moderately Integrated (05/07/2021)   Social Connection and Isolation Panel [NHANES]    Frequency of Communication with Friends and Family: More than three times a week    Frequency of Social Gatherings with Friends and Family: Three times a week    Attends Religious Services: More than 4 times per year    Active Member of Clubs or Organizations: No    Attends Banker Meetings: Never    Marital Status: Married    Objective:  BP 132/86   Pulse 88   Temp 98 F (36.7 C)   Ht 5\' 6"  (1.676 m)   Wt 162 lb (73.5 kg)   LMP  (LMP Unknown)   SpO2 95%   BMI 26.15 kg/m      04/28/2023    2:32 PM 03/24/2023   12:26 PM 03/24/2023    9:18 AM  BP/Weight  Systolic BP 132 168 158  Diastolic BP 86 90 90  Wt. (Lbs) 162  162  BMI 26.15 kg/m2  26.15 kg/m2    Physical Exam Vitals reviewed.  Constitutional:      Appearance: Normal appearance. She is normal weight.  Neck:     Vascular: No carotid bruit.  Cardiovascular:     Rate and Rhythm: Normal rate and  regular rhythm.     Heart sounds: Normal heart sounds.     Comments: Nontender. Pulmonary:     Effort: Pulmonary effort is normal. No respiratory distress.     Breath sounds: Normal breath sounds.  Abdominal:     General: Abdomen is flat. Bowel sounds are normal.     Palpations: Abdomen is soft.     Tenderness: There is no abdominal tenderness.  Neurological:     Mental Status: She is alert and oriented to person, place, and time.  Psychiatric:        Mood and Affect: Mood normal.        Behavior: Behavior normal.     Diabetic Foot Exam - Simple   No data filed      Lab Results  Component Value Date   WBC 5.8 03/24/2023   HGB 15.0 03/24/2023   HCT 44.6 03/24/2023   PLT 339 03/24/2023   GLUCOSE CANCELED 03/24/2023   CHOL 148 03/24/2023   TRIG 148 03/24/2023   HDL 53 03/24/2023   LDLCALC 70 03/24/2023   ALT 14 03/24/2023   AST 22 03/24/2023   NA 142 03/24/2023   K CANCELED 03/24/2023   CL 101 03/24/2023   CREATININE 0.50 (L) 03/24/2023   BUN 8 03/24/2023   CO2 21 03/24/2023   TSH 2.010 05/03/2022   INR 1.0 01/14/2023   HGBA1C 6.1 (H) 03/24/2023   MICROALBUR 10 05/29/2020      Assessment & Plan:  Other chest  pain Assessment & Plan: EKG: NSR, NO ST CHANGES.  START ON ASPIRIN  81 MG every day.  NTG GIVEN AND EDUCATED ON USE.  REFER TO CARDIOLOGY.  Orders: -     EKG 12-Lead -     Ambulatory referral to Cardiology -     Aspirin ; Take 1 tablet (81 mg total) by mouth daily. Swallow whole. -     Nitroglycerin ; Place 1 tablet (0.4 mg total) under the tongue every 5 (five) minutes as needed for chest pain.  Dispense: 25 tablet; Refill: 0  Essential hypertension, benign Assessment & Plan: Well controlled.  Continue valsartan  40 mg daily.    Mild recurrent major depression (HCC) Assessment & Plan: The current medical regimen is effective;  continue present plan and medications. Continue on effexor  xr 150 mg one daily in am. Lorazepam  0.5 mg 1 pill three times  a day as needed.       Meds ordered this encounter  Medications   aspirin  EC 81 MG tablet    Sig: Take 1 tablet (81 mg total) by mouth daily. Swallow whole.   nitroGLYCERIN  (NITROSTAT ) 0.4 MG SL tablet    Sig: Place 1 tablet (0.4 mg total) under the tongue every 5 (five) minutes as needed for chest pain.    Dispense:  25 tablet    Refill:  0    Orders Placed This Encounter  Procedures   Ambulatory referral to Cardiology   EKG 12-Lead     Follow-up: Return in about 2 months (around 06/28/2023) for chronic follow up.   I,Marla I Leal-Borjas,acting as a scribe for Mercy Stall, MD.,have documented all relevant documentation on the behalf of Mercy Stall, MD,as directed by  Mercy Stall, MD while in the presence of Mercy Stall, MD.   An After Visit Summary was printed and given to the patient.  I attest that I have reviewed this visit and agree with the plan scribed by my staff.   Mercy Stall, MD [facility] [phone]

## 2023-04-28 ENCOUNTER — Ambulatory Visit (INDEPENDENT_AMBULATORY_CARE_PROVIDER_SITE_OTHER): Admitting: Family Medicine

## 2023-04-28 VITALS — BP 132/86 | HR 88 | Temp 98.0°F | Ht 66.0 in | Wt 162.0 lb

## 2023-04-28 DIAGNOSIS — F330 Major depressive disorder, recurrent, mild: Secondary | ICD-10-CM

## 2023-04-28 DIAGNOSIS — I10 Essential (primary) hypertension: Secondary | ICD-10-CM | POA: Diagnosis not present

## 2023-04-28 DIAGNOSIS — R0789 Other chest pain: Secondary | ICD-10-CM

## 2023-04-28 MED ORDER — NITROGLYCERIN 0.4 MG SL SUBL: 0.4000 mg | SUBLINGUAL_TABLET | SUBLINGUAL | 0 refills | Status: DC | PRN

## 2023-04-28 MED ORDER — ASPIRIN 81 MG PO TBEC: 81.0000 mg | DELAYED_RELEASE_TABLET | Freq: Every day | ORAL | Status: AC

## 2023-05-01 ENCOUNTER — Encounter: Payer: Self-pay | Admitting: Family Medicine

## 2023-05-01 DIAGNOSIS — R0789 Other chest pain: Secondary | ICD-10-CM | POA: Insufficient documentation

## 2023-05-01 NOTE — Assessment & Plan Note (Signed)
 EKG: NSR, NO ST CHANGES.  START ON ASPIRIN  81 MG every day.  NTG GIVEN AND EDUCATED ON USE.  REFER TO CARDIOLOGY.

## 2023-05-01 NOTE — Assessment & Plan Note (Signed)
Well-controlled.  Continue valsartan 40 mg daily.

## 2023-05-01 NOTE — Assessment & Plan Note (Signed)
 The current medical regimen is effective;  continue present plan and medications. Continue on effexor  xr 150 mg one daily in am. Lorazepam  0.5 mg 1 pill three times a day as needed.

## 2023-05-04 ENCOUNTER — Ambulatory Visit

## 2023-05-04 ENCOUNTER — Other Ambulatory Visit: Payer: Self-pay | Admitting: Family Medicine

## 2023-05-04 VITALS — BP 130/70 | HR 83 | Ht 66.0 in | Wt 162.0 lb

## 2023-05-04 DIAGNOSIS — I10 Essential (primary) hypertension: Secondary | ICD-10-CM | POA: Diagnosis not present

## 2023-05-04 DIAGNOSIS — E782 Mixed hyperlipidemia: Secondary | ICD-10-CM | POA: Diagnosis not present

## 2023-05-04 DIAGNOSIS — R072 Precordial pain: Secondary | ICD-10-CM

## 2023-05-04 DIAGNOSIS — R0789 Other chest pain: Secondary | ICD-10-CM

## 2023-05-04 MED ORDER — METOPROLOL TARTRATE 100 MG PO TABS: 100.0000 mg | ORAL_TABLET | Freq: Once | ORAL | 0 refills | Status: DC

## 2023-05-04 NOTE — Assessment & Plan Note (Signed)
 Well-controlled.  Continue valsartan  40 mg once daily.  Continue to follow-up with PCP.

## 2023-05-04 NOTE — Progress Notes (Signed)
 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  Problem List Items Addressed This Visit     Precordial pain - Primary   Does have significant risk factors. Atypical chest pain in description 3 episodes in the past month.  No episodes in the last couple months. Cannot exclude obstructive coronary artery disease. Alternative differential of noncardiac chest pain related to musculoskeletal issues versus stress related response due to her current grieving over child's loss.  - Recommend to continue taking aspirin  81 mg once daily consistently - If symptoms recur or sustained, call 911 or head to the nearest ER. - Will proceed with cardiac CT coronary angiogram to assess for any significant obstructive coronary artery disease.  Given her nature of symptoms and functional limitations, would be a poor candidate for functional testing or pharmacological stress testing. -Will obtain transthoracic echocardiogram to rule out any significant cardiac structural or functional abnormalities.  Return to clinic based on test results.      Relevant Orders   ECHOCARDIOGRAM COMPLETE   CT CORONARY MORPH W/CTA COR W/SCORE W/CA W/CM &/OR WO/CM   Basic Metabolic Panel (BMET)   Mixed  hyperlipidemia   Well-controlled recent lipid panel 03/24/2023 with total cholesterol 148, HDL 53, LDL 70, triglycerides 148. Continue rosuvastatin  20 mg once daily.       Relevant Medications   metoprolol  tartrate (LOPRESSOR ) 100 MG tablet   Other Relevant Orders   ECHOCARDIOGRAM COMPLETE   Basic Metabolic Panel (BMET)   Essential hypertension, benign   Well-controlled.  Continue valsartan  40 mg once daily.  Continue to follow-up with PCP.       Relevant Medications   metoprolol  tartrate (LOPRESSOR ) 100 MG tablet   Other Relevant Orders   ECHOCARDIOGRAM COMPLETE   Basic Metabolic Panel (BMET)    Return to clinic based on test results.  History of Present Illness:    Virginia Montoya is a 73 y.o. female who is being seen today for the evaluation of chest pain at the request of Cox, Kirsten, MD.  Previously seen by Dr. Addie Holstein at heart care.  Has prior history of normal cardiac CT coronary angiogram from January 2019 with calcium  score 0.  Also has a history of hypertension, diet-controlled diabetes, hyperlipidemia, aortic atherosclerosis noted on recent CT lumbar spine, chronic back pain from degenerative disease of the spine and compression fractures having to regularly use a brace and limiting her functional status.  Very pleasant woman here for the visit by herself.  Lives at home with her husband.  Recently has been grieving the loss of her daughter secondary to brain tumor about 2 months ago.  She has been extremely emotional throughout the visit and in tears.  Mentions over the past many weeks she has had 3 instances of chest discomfort the first and  the most intense was about 6 weeks ago while at rest intense chest discomfort pressure-like heaviness, lasted for 15 minutes radiating to the neck relieved subsequently.  She had 2 other less intense episodes the last of which was couple weeks ago. Functional status somewhat limited due to her chronic back pain and having to use the  brace. Denies any orthopnea, paroxysmal nocturnal dyspnea.  No pedal edema.  No blood in urine or stools.  Her PCP started her on aspirin  81 mg once daily, she notes that has not been regularly taking this at this time. Continues to take rosuvastatin  20 mg once daily consistently and on valsartan  40 mg daily for blood pressure control.  Recent EKG from 04-28-2023 at PCPs office noted sinus rhythm with heart rate 85/min, PR interval 158 ms, QRS duration 70 ms, QTc 466 ms.  No significant ST-T changes to suggest ischemia.  Lipid panel from 03-24-2023 total cholesterol 148, triglycerides 148, HDL 53, LDL 70. Recent CBC and CMP from March 24, 2023 unremarkable.   Past Medical History:  Diagnosis Date   Chronic insomnia    Depression    Gastroesophageal reflux disease    Hyperlipidemia    Osteoporosis    Precordial chest pain 11/17/2016   Prediabetes     Past Surgical History:  Procedure Laterality Date   CHOLECYSTECTOMY  1999   CORONARY CALCIUM  SCORE/CORONARY CTA  01/2017   Coronary calcium  score is 0.  Normal coronary origin with normal right dominance.  No evidence of CAD.  Most likely noncardiac chest pain.   IR KYPHO LUMBAR INC FX REDUCE BONE BX UNI/BIL CANNULATION INC/IMAGING  01/14/2023   IR KYPHO THORACIC WITH BONE BIOPSY  01/14/2023   SHOULDER SURGERY  2005   TONSILLECTOMY     TUBAL LIGATION  1977    Current Medications: Current Meds  Medication Sig   aspirin  EC 81 MG tablet Take 1 tablet (81 mg total) by mouth daily. Swallow whole.   celecoxib  (CELEBREX ) 200 MG capsule TAKE 1 CAPSULE(200 MG) BY MOUTH DAILY   cholecalciferol (VITAMIN D3) 25 MCG (1000 UNIT) tablet Take 1,000 Units by mouth 2 (two) times daily.   diclofenac  Sodium (VOLTAREN ) 1 % GEL 4 gms four times a day as needed hip pain   famotidine  (PEPCID ) 20 MG tablet TAKE 1 TABLET(20 MG) BY MOUTH TWICE DAILY   fluticasone  (FLONASE ) 50 MCG/ACT nasal spray SHAKE LIQUID AND USE 2 SPRAYS IN EACH NOSTRIL DAILY    gabapentin  (NEURONTIN ) 100 MG capsule Take 100 mg by mouth at bedtime.   LORazepam  (ATIVAN ) 0.5 MG tablet TAKE 1 TABLET(0.5 MG) BY MOUTH EVERY 8 HOURS AS NEEDED FOR ANXIETY   lubiprostone  (AMITIZA ) 24 MCG capsule Take 1 capsule (24 mcg total) by mouth 2 (two) times daily with a meal.   methocarbamol  (ROBAXIN ) 500 MG tablet Take 500 mg by mouth 4 (four) times daily.   metoprolol  tartrate (LOPRESSOR ) 100 MG tablet Take 1 tablet (100 mg total) by mouth once for 1 dose. Please take this medication 2 hours before CT   Multiple Vitamins-Minerals (PRESERVISION AREDS 2 PO) Take by mouth.   nitroGLYCERIN  (NITROSTAT ) 0.4 MG SL tablet Place 1 tablet (0.4 mg total) under the tongue every 5 (five) minutes as needed for chest pain.   omeprazole  (PRILOSEC) 20 MG capsule TAKE 1 CAPSULE(20 MG) BY MOUTH DAILY   oxyCODONE -acetaminophen  (PERCOCET/ROXICET) 5-325 MG tablet Take 2 tablets by mouth 2 (two) times daily.   PROLIA  60 MG/ML SOSY injection Inject into the skin.  rosuvastatin  (CRESTOR ) 20 MG tablet TAKE 1 TABLET(20 MG) BY MOUTH AT BEDTIME   valsartan  (DIOVAN ) 40 MG tablet Take 1 tablet (40 mg total) by mouth daily.   venlafaxine  XR (EFFEXOR -XR) 150 MG 24 hr capsule TAKE 1 CAPSULE BY MOUTH DAILY   vitamin B-12 (CYANOCOBALAMIN) 1000 MCG tablet Take 1,000 mcg by mouth daily.   zolpidem (AMBIEN) 10 MG tablet TAKE 1 TABLET BY MOUTH AT BEDTIME     Allergies:   Cephalexin, Alendronate , Biaxin [clarithromycin], Ciprofloxacin , Macrobid [nitrofurantoin], and Sulfa  antibiotics   Social History   Socioeconomic History   Marital status: Married    Spouse name: Lawrence   Number of children: 3   Years of education: 12   Highest education level: Associate degree: academic program  Occupational History   Occupation: Retired  Tobacco Use   Smoking status: Never   Smokeless tobacco: Never  Vaping Use   Vaping status: Never Used  Substance and Sexual Activity   Alcohol use: No   Drug use: No   Sexual activity:  Not Currently  Other Topics Concern   Not on file  Social History Narrative   She is a married (remarried) mother of 3 with 4 grandchildren.  She has 3 great-grandchildren.  She previously worked at [employer] in the [employer].  She completed high school and beauty school.   She works outside a lot in the yard on gardening.  She does a lot of walking and exercises with walking for at least a half an hour a day 5 days a week.   wears sunscreen, brushes and flosses daily, see's dentist bi-annually, has smoke/carbon monoxide detectors, wears a seatbelt and practices gun safety   Social Drivers of Health   Financial Resource Strain: Low Risk  (05/07/2021)   Overall Financial Resource Strain (CARDIA)    Difficulty of Paying Living Expenses: Not hard at all  Food Insecurity: No Food Insecurity (05/10/2022)   Hunger Vital Sign    Worried About Running Out of Food in the Last Year: Never true    Ran Out of Food in the Last Year: Never true  Transportation Needs: No Transportation Needs (05/10/2022)   PRAPARE - Administrator, Civil Service (Medical): No    Lack of Transportation (Non-Medical): No  Physical Activity: Inactive (08/23/2022)   Exercise Vital Sign    Days of Exercise per Week: 0 days    Minutes of Exercise per Session: 0 min  Stress: No Stress Concern Present (05/10/2022)   [employer] of Occupational Health - Occupational Stress Questionnaire    Feeling of Stress : Only a little  Social Connections: Moderately Integrated (05/07/2021)   Social Connection and Isolation Panel [NHANES]    Frequency of Communication with Friends and Family: More than three times a week    Frequency of Social Gatherings with Friends and Family: Three times a week    Attends Religious Services: More than 4 times per year    Active Member of Clubs or Organizations: No    Attends Banker Meetings: Never    Marital Status: Married     Family History: The patient's family  history includes Diabetes in her maternal grandmother and mother; Emphysema in her father; Hyperlipidemia in her mother; Liver cancer in her maternal grandmother. ROS:   Please see the history of present illness.    All 14 point review of systems negative except as described per history of present illness.  EKGs/Labs/Other Studies Reviewed:    The following  studies were reviewed today:   EKG:       Recent Labs: 03/24/2023: ALT 14; BUN 8; Creatinine, Ser 0.50; Hemoglobin 15.0; Platelets 339; Potassium CANCELED; Sodium 142  Recent Lipid Panel    Component Value Date/Time   CHOL 148 03/24/2023 1004   TRIG 148 03/24/2023 1004   HDL 53 03/24/2023 1004   CHOLHDL 2.8 03/24/2023 1004   LDLCALC 70 03/24/2023 1004    Physical Exam:    VS:  BP 130/70   Pulse 83   Ht 5\' 6"  (1.676 m)   Wt 162 lb (73.5 kg)   LMP  (LMP Unknown)   SpO2 94%   BMI 26.15 kg/m     Wt Readings from Last 3 Encounters:  05/04/23 162 lb (73.5 kg)  04/28/23 162 lb (73.5 kg)  03/24/23 162 lb (73.5 kg)     GENERAL:  Well nourished, well developed in no acute distress.  In a brace for the thoracolumbar spine. NECK: No JVD; No carotid bruits CARDIAC: RRR, S1 and S2 present, no murmurs, no rubs, no gallops CHEST:  Clear to auscultation without rales, wheezing or rhonchi  Extremities: No pitting pedal edema. Pulses bilaterally symmetric with radial 2+ and dorsalis pedis 2+ NEUROLOGIC:  Alert and oriented x 3  Medication Adjustments/Labs and Tests Ordered: Current medicines are reviewed at length with the patient today.  Concerns regarding medicines are outlined above.  Orders Placed This Encounter  Procedures   CT CORONARY MORPH W/CTA COR W/SCORE W/CA W/CM &/OR WO/CM   Basic Metabolic Panel (BMET)   ECHOCARDIOGRAM COMPLETE   Meds ordered this encounter  Medications   metoprolol  tartrate (LOPRESSOR ) 100 MG tablet    Sig: Take 1 tablet (100 mg total) by mouth once for 1 dose. Please take this medication 2  hours before CT    Dispense:  1 tablet    Refill:  0    Signed, Uriyah Raska reddy Kimarion Chery, MD, MPH, [facility]. 05/04/2023 8:45 AM    [facility]

## 2023-05-04 NOTE — Assessment & Plan Note (Signed)
 Does have significant risk factors. Atypical chest pain in description 3 episodes in the past month.  No episodes in the last couple months. Cannot exclude obstructive coronary artery disease. Alternative differential of noncardiac chest pain related to musculoskeletal issues versus stress related response due to her current grieving over child's loss.  - Recommend to continue taking aspirin  81 mg once daily consistently - If symptoms recur or sustained, call 911 or head to the nearest ER. - Will proceed with cardiac CT coronary angiogram to assess for any significant obstructive coronary artery disease.  Given her nature of symptoms and functional limitations, would be a poor candidate for functional testing or pharmacological stress testing. -Will obtain transthoracic echocardiogram to rule out any significant cardiac structural or functional abnormalities.  Return to clinic based on test results.

## 2023-05-04 NOTE — Assessment & Plan Note (Signed)
 Well-controlled recent lipid panel 03/24/2023 with total cholesterol 148, HDL 53, LDL 70, triglycerides 148. Continue rosuvastatin  20 mg once daily.

## 2023-05-04 NOTE — Patient Instructions (Signed)
 Medication Instructions:  Your physician recommends that you continue on your current medications as directed. Please refer to the Current Medication list given to you today.  *If you need a refill on your cardiac medications before your next appointment, please call your pharmacy*  Lab Work: Your physician recommends that you return for lab work in:   Labs today: BMP  If you have labs (blood work) drawn today and your tests are completely normal, you will receive your results only by: MyChart Message (if you have MyChart) OR A paper copy in the mail If you have any lab test that is abnormal or we need to change your treatment, we will call you to review the results.  Testing/Procedures: Your physician has requested that you have an echocardiogram. Echocardiography is a painless test that uses sound waves to create images of your heart. It provides your doctor with information about the size and shape of your heart and how well your heart's chambers and valves are working. This procedure takes approximately one hour. There are no restrictions for this procedure. Please do NOT wear cologne, perfume, aftershave, or lotions (deodorant is allowed). Please arrive 15 minutes prior to your appointment time.  Please note: We ask at that you not bring children with you during ultrasound (echo/ vascular) testing. Due to room size and safety concerns, children are not allowed in the ultrasound rooms during exams. Our front office staff cannot provide observation of children in our lobby area while testing is being conducted. An adult accompanying a patient to their appointment will only be allowed in the ultrasound room at the discretion of the ultrasound technician under special circumstances. We apologize for any inconvenience.    Your cardiac CT will be scheduled at one of the below locations:   [facility] [address] [place], [place] [address] [phone]  OR  [facility] [address] [place], [place] [address] [phone]  OR   [facility] [address] [place], [place] [address] [phone]  OR   [facility] [address] [place], [place] [address] [phone]  OR   [facility] and Vascular Tower [address]  [place], [place] [address] Opening May 02, 2023  If scheduled at [facility], please arrive at the [facility] and Children's Entrance (Entrance C2) of [facility] 30 minutes prior to test start time. You can use the FREE valet parking offered at entrance C (encouraged to control the heart rate for the test)  Proceed to the [facility] Radiology Department (first floor) to check-in and test prep.   All radiology patients and guests should use entrance C2 at [facility], accessed from [facility], even though the hospital's physical address listed is [address]    If scheduled at the Heart and Vascular Tower at [address], please enter the parking lot using the [address] entrance and use the FREE valet service at the patient drop-off area. Enter the buidling and check-in with registration on the main floor.  If scheduled at [facility] or [facility], please arrive 15 mins early for check-in and test prep.  There is spacious parking and easy access to the radiology department from the [facility] Heart and Vascular entrance. Please enter here and check-in with the desk attendant.   If scheduled at [facility], please arrive 30 minutes early for check-in  and test prep.  Please follow these instructions carefully (unless otherwise directed):  An IV will be required for this test and Nitroglycerin  will be given.  Hold all erectile dysfunction medications at least 3 days (72 hrs) prior to test. (Ie viagra, cialis, sildenafil,  tadalafil, etc)   On the Night Before the Test: Be sure to Drink plenty of water. Do not consume any caffeinated/decaffeinated beverages or chocolate 12 hours prior to your test. Do not take any antihistamines 12 hours prior to your test.  On the Day of the Test: Drink plenty of water until 1 hour prior to the test. Do not eat any food 1 hour prior to test. You may take your regular medications prior to the test.  Take metoprolol  (Lopressor ) two hours prior to test. Patients who wear a continuous glucose monitor MUST remove the device prior to scanning. FEMALES- please wear underwire-free bra if available, avoid dresses & tight clothing      After the Test: Drink plenty of water. After receiving IV contrast, you may experience a mild flushed feeling. This is normal. On occasion, you may experience a mild rash up to 24 hours after the test. This is not dangerous. If this occurs, you can take Benadryl 25 mg, Zyrtec, Claritin, or Allegra and increase your fluid intake. (Patients taking Tikosyn should avoid Benadryl, and may take Zyrtec, Claritin, or Allegra) If you experience trouble breathing, this can be serious. If it is severe call 911 IMMEDIATELY. If it is mild, please call our office.  We will call to schedule your test 2-4 weeks out understanding that some insurance companies will need an authorization prior to the service being performed.   For more information and frequently asked questions, please visit our website : [email or web address]  For non-scheduling related questions, please contact the cardiac imaging nurse navigator should you have any questions/concerns: Cardiac Imaging Nurse Navigators Direct Office Dial: [phone]   For scheduling needs, including cancellations and rescheduling, please call [place], [phone].   Follow-Up: At [facility], you and your health needs are our priority.  As part of our continuing mission to provide you  with exceptional heart care, our providers are all part of one team.  This team includes your primary Cardiologist (physician) and Advanced Practice Providers or APPs (Physician Assistants and Nurse Practitioners) who all work together to provide you with the care you need, when you need it.  Your next appointment:   Follow up to be determined based on testing  Provider:   Bertha Broad, MD    We recommend signing up for the patient portal called "MyChart".  Sign up information is provided on this After Visit Summary.  MyChart is used to connect with patients for Virtual Visits (Telemedicine).  Patients are able to view lab/test results, encounter notes, upcoming appointments, etc.  Non-urgent messages can be sent to your provider as well.   To learn more about what you can do with MyChart, go to [email or web address].   Other Instructions None

## 2023-05-05 ENCOUNTER — Other Ambulatory Visit: Payer: Self-pay

## 2023-05-05 LAB — BASIC METABOLIC PANEL WITH GFR
BUN/Creatinine Ratio: 19 (ref 12–28)
BUN: 10 mg/dL (ref 8–27)
CO2: 21 mmol/L (ref 20–29)
Calcium: 9 mg/dL (ref 8.7–10.3)
Chloride: 105 mmol/L (ref 96–106)
Creatinine, Ser: 0.54 mg/dL — ABNORMAL LOW (ref 0.57–1.00)
Glucose: 107 mg/dL — ABNORMAL HIGH (ref 70–99)
Potassium: 4.6 mmol/L (ref 3.5–5.2)
Sodium: 141 mmol/L (ref 134–144)
eGFR: 97 mL/min/{1.73_m2} (ref >59)

## 2023-05-06 ENCOUNTER — Other Ambulatory Visit: Payer: Self-pay

## 2023-05-16 ENCOUNTER — Other Ambulatory Visit: Payer: Self-pay

## 2023-05-16 DIAGNOSIS — E782 Mixed hyperlipidemia: Secondary | ICD-10-CM

## 2023-05-16 DIAGNOSIS — I10 Essential (primary) hypertension: Secondary | ICD-10-CM

## 2023-05-16 DIAGNOSIS — R072 Precordial pain: Secondary | ICD-10-CM

## 2023-05-23 ENCOUNTER — Other Ambulatory Visit: Payer: Self-pay | Admitting: Family Medicine

## 2023-05-23 DIAGNOSIS — F411 Generalized anxiety disorder: Secondary | ICD-10-CM

## 2023-05-24 DIAGNOSIS — M6281 Muscle weakness (generalized): Secondary | ICD-10-CM | POA: Diagnosis not present

## 2023-05-24 DIAGNOSIS — M5489 Other dorsalgia: Secondary | ICD-10-CM | POA: Diagnosis not present

## 2023-05-26 DIAGNOSIS — H353221 Exudative age-related macular degeneration, left eye, with active choroidal neovascularization: Secondary | ICD-10-CM | POA: Diagnosis not present

## 2023-05-28 ENCOUNTER — Other Ambulatory Visit: Payer: Self-pay

## 2023-05-31 ENCOUNTER — Ambulatory Visit (HOSPITAL_BASED_OUTPATIENT_CLINIC_OR_DEPARTMENT_OTHER)
Admission: RE | Admit: 2023-05-31 | Discharge: 2023-05-31 | Disposition: A | Discharge status: Home / Self Care | Source: Ambulatory Visit

## 2023-05-31 ENCOUNTER — Other Ambulatory Visit (HOSPITAL_BASED_OUTPATIENT_CLINIC_OR_DEPARTMENT_OTHER)

## 2023-05-31 DIAGNOSIS — R072 Precordial pain: Secondary | ICD-10-CM | POA: Diagnosis not present

## 2023-05-31 LAB — ECHOCARDIOGRAM COMPLETE
AR max vel: 2.23 cm2
AV Area VTI: 2.41 cm2
AV Area mean vel: 2.08 cm2
AV Mean grad: 3 mmHg
AV Peak grad: 4.2 mmHg
AV Vena cont: 0.3 cm
Ao pk vel: 1.03 m/s
Area-P 1/2: 2.21 cm2
Calc EF: 60.4 %
MV M vel: 4.16 m/s
MV Peak grad: 69.2 mmHg
P 1/2 time: 531 ms
S' Lateral: 3.2 cm
Single Plane A2C EF: 60.2 %
Single Plane A4C EF: 60.7 %

## 2023-06-01 DIAGNOSIS — M5489 Other dorsalgia: Secondary | ICD-10-CM | POA: Diagnosis not present

## 2023-06-01 DIAGNOSIS — M6281 Muscle weakness (generalized): Secondary | ICD-10-CM | POA: Diagnosis not present

## 2023-06-02 ENCOUNTER — Ambulatory Visit: Payer: Self-pay

## 2023-06-03 ENCOUNTER — Encounter (HOSPITAL_COMMUNITY): Payer: Self-pay

## 2023-06-03 DIAGNOSIS — M6281 Muscle weakness (generalized): Secondary | ICD-10-CM | POA: Diagnosis not present

## 2023-06-03 DIAGNOSIS — M5489 Other dorsalgia: Secondary | ICD-10-CM | POA: Diagnosis not present

## 2023-06-07 ENCOUNTER — Ambulatory Visit (HOSPITAL_BASED_OUTPATIENT_CLINIC_OR_DEPARTMENT_OTHER)
Admission: RE | Admit: 2023-06-07 | Discharge: 2023-06-07 | Disposition: A | Discharge status: Home / Self Care | Source: Ambulatory Visit

## 2023-06-07 DIAGNOSIS — R072 Precordial pain: Secondary | ICD-10-CM | POA: Diagnosis not present

## 2023-06-07 DIAGNOSIS — M6281 Muscle weakness (generalized): Secondary | ICD-10-CM | POA: Diagnosis not present

## 2023-06-07 DIAGNOSIS — M5489 Other dorsalgia: Secondary | ICD-10-CM | POA: Diagnosis not present

## 2023-06-07 DIAGNOSIS — R079 Chest pain, unspecified: Secondary | ICD-10-CM | POA: Diagnosis not present

## 2023-06-07 MED ORDER — IOHEXOL 350 MG/ML SOLN
100.0000 mL | Freq: Once | INTRAVENOUS | Status: AC | PRN
  Administered 2023-06-07: 95 mL via INTRAVENOUS

## 2023-06-07 MED ORDER — NITROGLYCERIN 0.4 MG SL SUBL
0.8000 mg | SUBLINGUAL_TABLET | Freq: Once | SUBLINGUAL | Status: AC
  Administered 2023-06-07: 0.8 mg via SUBLINGUAL

## 2023-06-10 DIAGNOSIS — M5489 Other dorsalgia: Secondary | ICD-10-CM | POA: Diagnosis not present

## 2023-06-10 DIAGNOSIS — M6281 Muscle weakness (generalized): Secondary | ICD-10-CM | POA: Diagnosis not present

## 2023-06-13 DIAGNOSIS — M5489 Other dorsalgia: Secondary | ICD-10-CM | POA: Diagnosis not present

## 2023-06-13 DIAGNOSIS — M6281 Muscle weakness (generalized): Secondary | ICD-10-CM | POA: Diagnosis not present

## 2023-06-17 ENCOUNTER — Other Ambulatory Visit: Payer: Self-pay | Admitting: Family Medicine

## 2023-06-17 DIAGNOSIS — M6281 Muscle weakness (generalized): Secondary | ICD-10-CM | POA: Diagnosis not present

## 2023-06-17 DIAGNOSIS — M5489 Other dorsalgia: Secondary | ICD-10-CM | POA: Diagnosis not present

## 2023-06-21 ENCOUNTER — Other Ambulatory Visit: Payer: Self-pay | Admitting: Family Medicine

## 2023-06-21 DIAGNOSIS — M5489 Other dorsalgia: Secondary | ICD-10-CM | POA: Diagnosis not present

## 2023-06-21 DIAGNOSIS — M6281 Muscle weakness (generalized): Secondary | ICD-10-CM | POA: Diagnosis not present

## 2023-06-23 DIAGNOSIS — M5489 Other dorsalgia: Secondary | ICD-10-CM | POA: Diagnosis not present

## 2023-06-23 DIAGNOSIS — M6281 Muscle weakness (generalized): Secondary | ICD-10-CM | POA: Diagnosis not present

## 2023-06-27 ENCOUNTER — Telehealth: Payer: Self-pay

## 2023-06-27 NOTE — Telephone Encounter (Signed)
 LM for patient to see if she has heard anything from [facility] about her Prolia 

## 2023-06-28 DIAGNOSIS — S32020D Wedge compression fracture of second lumbar vertebra, subsequent encounter for fracture with routine healing: Secondary | ICD-10-CM | POA: Diagnosis not present

## 2023-06-28 DIAGNOSIS — M546 Pain in thoracic spine: Secondary | ICD-10-CM | POA: Diagnosis not present

## 2023-06-28 NOTE — Progress Notes (Signed)
 Subjective:  Patient ID: Virginia Montoya, female    DOB: Dec 26, 1950  Age: 73 y.o. MRN: [id number]  Chief Complaint  Patient presents with   Medical Management of Chronic Issues    HPI: Osteoporosis. Prolia  60 mg every 6 months, Vitamin D  1000 U qd. Intolerant to calcium  due to constipation.     GAD/Depression: on effexor  xr 150 mg one daily in am. Lorazepam  0.5 mg 1 pill twice a day and one as needed during the day. She usually only takes Lorazepam  1 pill a day. Does not need to take her ambien at night for sleep.    Diabetes: Diagnosed in 08/2022. A1C 6.1 Limiting sugars. Not able to walk for exercise.   GERD. On omeprazole  20 mg daily. Well-controlled.    Macular degeneration: sees eye doctor.   Hypertension: on valsartan  40 mg daily.    Hyperlipidemia: on crestor  20 mg before bed. Eats fairly healthy and exercising.  Back Pain - PT twice a week and dry needling weekly [facility]. Patient is in pain all the time. Had a compression fracture last year with a kyphoplasty and continues to have pain. On celebrex , gabapentin , methocarbamol  and percocet 5/325 mg 2 oral twice daily. She feels like something is making her sleepy and cannot play with her grandchildren. Pain 7/10.      06/29/2023   11:08 AM 04/28/2023    2:33 PM 03/24/2023    9:05 AM 12/24/2022   10:12 AM 08/23/2022    9:49 AM  Depression screen PHQ 2/9  Decreased Interest 0 1  0 0  Down, Depressed, Hopeless 1 1 3  0 0  PHQ - 2 Score 1 2 3  0 0  Altered sleeping 1 0 0 0 2  Tired, decreased energy 0 3 1 0 0  Change in appetite 0 0 0 0 2  Feeling bad or failure about yourself  1 0 0 0 0  Trouble concentrating 0 0 0 0 0  Moving slowly or fidgety/restless 2 1 0 0 0  Suicidal thoughts 0 0 0 0 0  PHQ-9 Score 5 6 4  0 4  Difficult doing work/chores Extremely dIfficult Somewhat difficult Somewhat difficult Not difficult at all Not difficult at all        06/29/2023   11:09 AM 04/28/2023    2:33 PM 08/23/2022    9:49 AM  10/19/2021    9:31 AM  GAD 7 : Generalized Anxiety Score  Nervous, Anxious, on Edge 1 1 1  0  Control/stop worrying 0 1 0 0  Worry too much - different things 0 2 0 0  Trouble relaxing 0 2 3 0  Restless 0 0 0 0  Easily annoyed or irritable 0 0 0 0  Afraid - awful might happen 0 0 0 0  Total GAD 7 Score 1 6 4  0  Anxiety Difficulty Not difficult at all Somewhat difficult Not difficult at all Not difficult at all         03/24/2023    9:04 AM  Fall Risk   Falls in the past year? 0  Number falls in past yr: 0  Injury with Fall? 0  Risk for fall due to : No Fall Risks  Follow up Falls evaluation completed    Patient Care Team: Sherre Clapper, MD as PCP - General (Family Medicine) Erasmo Bernardino BRAVO, OD (Optometry)   Review of Systems  Constitutional:  Negative for chills, fatigue and fever.  HENT:  Negative for congestion, ear  pain, rhinorrhea and sore throat.   Respiratory:  Negative for cough and shortness of breath.   Cardiovascular:  Negative for chest pain.  Gastrointestinal:  Negative for abdominal pain, constipation, diarrhea, nausea and vomiting.  Genitourinary:  Negative for dysuria and urgency.  Musculoskeletal:  Positive for back pain. Negative for myalgias.  Neurological:  Negative for dizziness, weakness, light-headedness and headaches.  Psychiatric/Behavioral:  Negative for dysphoric mood. The patient is not nervous/anxious.     Current Outpatient Medications on File Prior to Visit  Medication Sig Dispense Refill   aspirin  EC 81 MG tablet Take 1 tablet (81 mg total) by mouth daily. Swallow whole.     celecoxib  (CELEBREX ) 200 MG capsule TAKE 1 CAPSULE(200 MG) BY MOUTH DAILY 30 capsule 1   cholecalciferol (VITAMIN D3) 25 MCG (1000 UNIT) tablet Take 1,000 Units by mouth 2 (two) times daily.     diclofenac  Sodium (VOLTAREN ) 1 % GEL 4 gms four times a day as needed hip pain 350 g 1   famotidine  (PEPCID ) 20 MG tablet TAKE 1 TABLET(20 MG) BY MOUTH TWICE DAILY 180 tablet 3    fluticasone  (FLONASE ) 50 MCG/ACT nasal spray SHAKE LIQUID AND USE 2 SPRAYS IN EACH NOSTRIL DAILY 16 g 6   gabapentin  (NEURONTIN ) 100 MG capsule Take 100 mg by mouth at bedtime.     LORazepam  (ATIVAN ) 0.5 MG tablet TAKE 1 TABLET(0.5 MG) BY MOUTH EVERY 8 HOURS AS NEEDED FOR ANXIETY 90 tablet 2   lubiprostone  (AMITIZA ) 24 MCG capsule Take 1 capsule (24 mcg total) by mouth 2 (two) times daily with a meal. 60 capsule 2   methocarbamol  (ROBAXIN ) 500 MG tablet Take 500 mg by mouth 4 (four) times daily.     metoprolol  tartrate (LOPRESSOR ) 100 MG tablet Take 1 tablet (100 mg total) by mouth once for 1 dose. Please take this medication 2 hours before CT 1 tablet 0   Multiple Vitamins-Minerals (PRESERVISION AREDS 2 PO) Take by mouth.     nitroGLYCERIN  (NITROSTAT ) 0.4 MG SL tablet PLACE 1 TABLET UNDER THE TONGUE EVERY 5 MINUTES AS NEEDED FOR CHEST PAIN 25 tablet 0   omeprazole  (PRILOSEC) 20 MG capsule TAKE 1 CAPSULE(20 MG) BY MOUTH DAILY 90 capsule 1   oxyCODONE -acetaminophen  (PERCOCET/ROXICET) 5-325 MG tablet Take 2 tablets by mouth 2 (two) times daily.     PROLIA  60 MG/ML SOSY injection Inject into the skin.     rosuvastatin  (CRESTOR ) 20 MG tablet TAKE 1 TABLET(20 MG) BY MOUTH AT BEDTIME 90 tablet 0   valsartan  (DIOVAN ) 40 MG tablet TAKE 1 TABLET(40 MG) BY MOUTH DAILY 90 tablet 0   venlafaxine  XR (EFFEXOR -XR) 150 MG 24 hr capsule TAKE 1 CAPSULE BY MOUTH DAILY 90 capsule 1   vitamin B-12 (CYANOCOBALAMIN) 1000 MCG tablet Take 1,000 mcg by mouth daily.     No current facility-administered medications on file prior to visit.   Past Medical History:  Diagnosis Date   Chronic insomnia    Depression    Gastroesophageal reflux disease    Hyperlipidemia    Osteoporosis    Precordial chest pain 11/17/2016   Prediabetes    Past Surgical History:  Procedure Laterality Date   CHOLECYSTECTOMY  1999   CORONARY CALCIUM  SCORE/CORONARY CTA  01/2017   Coronary calcium  score is 0.  Normal coronary origin with  normal right dominance.  No evidence of CAD.  Most likely noncardiac chest pain.   IR KYPHO LUMBAR INC FX REDUCE BONE BX UNI/BIL CANNULATION INC/IMAGING  01/14/2023   IR  KYPHO THORACIC WITH BONE BIOPSY  01/14/2023   SHOULDER SURGERY  2005   TONSILLECTOMY     TUBAL LIGATION  1977    Family History  Problem Relation Age of Onset   Diabetes Mother    Hyperlipidemia Mother    Emphysema Father    Diabetes Maternal Grandmother    Liver cancer Maternal Grandmother    Social History   Socioeconomic History   Marital status: Married    Spouse name: Jerilynn   Number of children: 3   Years of education: 12   Highest education level: Associate degree: academic program  Occupational History   Occupation: Retired  Tobacco Use   Smoking status: Never   Smokeless tobacco: Never  Vaping Use   Vaping status: Never Used  Substance and Sexual Activity   Alcohol use: No   Drug use: No   Sexual activity: Not Currently  Other Topics Concern   Not on file  Social History Narrative   She is a married (remarried) mother of 3 with 4 grandchildren.  She has 3 great-grandchildren.  She previously worked at [employer] in the [employer].  She completed high school and beauty school.   She works outside a lot in the yard on gardening.  She does a lot of walking and exercises with walking for at least a half an hour a day 5 days a week.   wears sunscreen, brushes and flosses daily, see's dentist bi-annually, has smoke/carbon monoxide detectors, wears a seatbelt and practices gun safety   Social Drivers of Health   Financial Resource Strain: Low Risk  (06/29/2023)   Overall Financial Resource Strain (CARDIA)    Difficulty of Paying Living Expenses: Not hard at all  Food Insecurity: No Food Insecurity (06/29/2023)   Hunger Vital Sign    Worried About Running Out of Food in the Last Year: Never true    Ran Out of Food in the Last Year: Never true  Transportation Needs: No Transportation Needs  (06/29/2023)   PRAPARE - Administrator, Civil Service (Medical): No    Lack of Transportation (Non-Medical): No  Physical Activity: Insufficiently Active (06/29/2023)   Exercise Vital Sign    Days of Exercise per Week: 2 days    Minutes of Exercise per Session: 60 min  Stress: No Stress Concern Present (06/29/2023)   [employer] of Occupational Health - Occupational Stress Questionnaire    Feeling of Stress: Not at all  Social Connections: Moderately Integrated (06/29/2023)   Social Connection and Isolation Panel    Frequency of Communication with Friends and Family: More than three times a week    Frequency of Social Gatherings with Friends and Family: Three times a week    Attends Religious Services: More than 4 times per year    Active Member of Clubs or Organizations: No    Attends Banker Meetings: Never    Marital Status: Married    Objective:  BP 128/80   Pulse 88   Temp 98 F (36.7 C)   Ht 5' 6 (1.676 m)   Wt 159 lb (72.1 kg)   LMP  (LMP Unknown)   SpO2 98%   BMI 25.66 kg/m      06/29/2023   11:06 AM 06/07/2023    9:38 AM 06/07/2023    9:20 AM  BP/Weight  Systolic BP 128 128 153  Diastolic BP 80 57 55  Wt. (Lbs) 159    BMI 25.66 kg/m2  Physical Exam Vitals reviewed.  Constitutional:      Appearance: Normal appearance. She is normal weight.  Neck:     Vascular: No carotid bruit.   Cardiovascular:     Rate and Rhythm: Normal rate and regular rhythm.     Heart sounds: Normal heart sounds.  Pulmonary:     Effort: Pulmonary effort is normal. No respiratory distress.     Breath sounds: Normal breath sounds.  Abdominal:     General: Abdomen is flat. Bowel sounds are normal.     Palpations: Abdomen is soft.     Tenderness: There is no abdominal tenderness.   Musculoskeletal:        General: Tenderness (lower thoracic and lumbar in a band across back.) present.   Neurological:     Mental Status: She is alert and oriented to  person, place, and time.   Psychiatric:        Mood and Affect: Mood normal.        Behavior: Behavior normal.      Diabetic foot exam was performed with the following findings:   No deformities, ulcerations, or other skin breakdown Normal sensation of 10g monofilament Intact posterior tibialis and dorsalis pedis pulses      Lab Results  Component Value Date   WBC 6.8 06/29/2023   HGB 14.5 06/29/2023   HCT 43.7 06/29/2023   PLT 334 06/29/2023   GLUCOSE 97 06/29/2023   CHOL 148 03/24/2023   TRIG 148 03/24/2023   HDL 53 03/24/2023   LDLCALC 70 03/24/2023   ALT 22 06/29/2023   AST 22 06/29/2023   NA 140 06/29/2023   K 5.3 (H) 06/29/2023   CL 102 06/29/2023   CREATININE 0.74 06/29/2023   BUN 18 06/29/2023   CO2 24 06/29/2023   TSH 2.010 05/03/2022   INR 1.0 01/14/2023   HGBA1C 6.5 (H) 06/29/2023   MICROALBUR 10 05/29/2020      Assessment & Plan:  Hypertension associated with type 2 diabetes mellitus (HCC) Assessment & Plan: Diabetes and hypertension well controlled.  Continue valsartan  40 mg daily.  Continue to work on eating a healthy diet and exercise.  Labs drawn today.    Orders: -     CBC with Differential/Platelet -     Comprehensive metabolic panel with GFR -     Hemoglobin A1c  Idiopathic peripheral neuropathy Assessment & Plan: Controlled Continue current medication regimen. Gabapentin  100 mg by mouth THREE TIMES A DAY.    Mixed hyperlipidemia Assessment & Plan: Well controlled.  No changes to medicines. crestor  20 mg before bed. Continue to work on eating a healthy diet and exercise.     GAD (generalized anxiety disorder) Assessment & Plan: Fairly Well controlled.  Continue on current medicaitons.  Continue on effexor  xr 150 mg one daily in am. Lorazepam  0.5 mg 1 pill as needed during the day and patient takes 1 pill in evening. Takes ambien at night for sleep.     Age-related osteoporosis without current pathological  fracture Assessment & Plan: Continue Prolia  60 mg every 6 months, Vitamin D  1000 U q   Gastroesophageal reflux disease without esophagitis Assessment & Plan: The current medical regimen is effective;  continue present plan and medications. Continue omeprazole  20 mg daily .   Mild recurrent major depression (HCC) Assessment & Plan: The current medical regimen is effective;  continue present plan and medications. Continue on effexor  xr 150 mg one daily in am. Lorazepam  0.5 mg 1 pill three times a  day as needed.    Chronic bilateral thoracic back pain Assessment & Plan: Patient is being referred to the pain clinic by neurosurgery.  Hopefully she can get some relief.  Currently on Celebrex , gabapentin , methocarbamol , and Percocet.  Going to physical therapy also..   Chronic midline low back pain without sciatica Assessment & Plan: Patient is being referred to the pain clinic by neurosurgery.  Hopefully she can get some relief.  Currently on Celebrex , gabapentin , methocarbamol , and Percocet.  Going to physical therapy also..      No orders of the defined types were placed in this encounter.   Orders Placed This Encounter  Procedures   CBC with Differential/Platelet   Comprehensive metabolic panel with GFR   Hemoglobin A1c     Follow-up: Return in about 3 months (around 09/29/2023) for chronic follow up.   I,Marla I Leal-Borjas,acting as a scribe for Abigail Free, MD.,have documented all relevant documentation on the behalf of Abigail Free, MD,as directed by  Abigail Free, MD while in the presence of Abigail Free, MD.   An After Visit Summary was printed and given to the patient.  Abigail Free, MD [facility] [phone]

## 2023-06-29 ENCOUNTER — Other Ambulatory Visit: Payer: Self-pay

## 2023-06-29 ENCOUNTER — Encounter: Payer: Self-pay | Admitting: Family Medicine

## 2023-06-29 ENCOUNTER — Ambulatory Visit (INDEPENDENT_AMBULATORY_CARE_PROVIDER_SITE_OTHER): Admitting: Family Medicine

## 2023-06-29 VITALS — BP 128/80 | HR 88 | Temp 98.0°F | Ht 66.0 in | Wt 159.0 lb

## 2023-06-29 DIAGNOSIS — G609 Hereditary and idiopathic neuropathy, unspecified: Secondary | ICD-10-CM

## 2023-06-29 DIAGNOSIS — I152 Hypertension secondary to endocrine disorders: Secondary | ICD-10-CM | POA: Diagnosis not present

## 2023-06-29 DIAGNOSIS — E1159 Type 2 diabetes mellitus with other circulatory complications: Secondary | ICD-10-CM

## 2023-06-29 DIAGNOSIS — F411 Generalized anxiety disorder: Secondary | ICD-10-CM

## 2023-06-29 DIAGNOSIS — M810 Age-related osteoporosis without current pathological fracture: Secondary | ICD-10-CM | POA: Diagnosis not present

## 2023-06-29 DIAGNOSIS — F330 Major depressive disorder, recurrent, mild: Secondary | ICD-10-CM | POA: Diagnosis not present

## 2023-06-29 DIAGNOSIS — M546 Pain in thoracic spine: Secondary | ICD-10-CM

## 2023-06-29 DIAGNOSIS — E782 Mixed hyperlipidemia: Secondary | ICD-10-CM | POA: Diagnosis not present

## 2023-06-29 DIAGNOSIS — K219 Gastro-esophageal reflux disease without esophagitis: Secondary | ICD-10-CM

## 2023-06-29 DIAGNOSIS — M545 Low back pain, unspecified: Secondary | ICD-10-CM | POA: Diagnosis not present

## 2023-06-29 DIAGNOSIS — G8929 Other chronic pain: Secondary | ICD-10-CM

## 2023-06-29 MED ORDER — DENOSUMAB 60 MG/ML ~~LOC~~ SOSY
60.0000 mg | PREFILLED_SYRINGE | Freq: Once | SUBCUTANEOUS | 0 refills | Status: AC
Start: 2023-06-29 — End: 2023-06-29

## 2023-06-30 ENCOUNTER — Ambulatory Visit: Payer: Self-pay | Admitting: Family Medicine

## 2023-06-30 DIAGNOSIS — H353221 Exudative age-related macular degeneration, left eye, with active choroidal neovascularization: Secondary | ICD-10-CM | POA: Diagnosis not present

## 2023-06-30 LAB — CBC WITH DIFFERENTIAL/PLATELET
Basophils Absolute: 0.1 10*3/uL (ref 0.0–0.2)
Basos: 1 %
EOS (ABSOLUTE): 0.1 10*3/uL (ref 0.0–0.4)
Eos: 2 %
Hematocrit: 43.7 % (ref 34.0–46.6)
Hemoglobin: 14.5 g/dL (ref 11.1–15.9)
Immature Grans (Abs): 0 10*3/uL (ref 0.0–0.1)
Immature Granulocytes: 0 %
Lymphocytes Absolute: 2.2 10*3/uL (ref 0.7–3.1)
Lymphs: 33 %
MCH: 30.2 pg (ref 26.6–33.0)
MCHC: 33.2 g/dL (ref 31.5–35.7)
MCV: 91 fL (ref 79–97)
Monocytes Absolute: 0.5 10*3/uL (ref 0.1–0.9)
Monocytes: 7 %
Neutrophils Absolute: 3.9 10*3/uL (ref 1.4–7.0)
Neutrophils: 57 %
Platelets: 334 10*3/uL (ref 150–450)
RBC: 4.8 x10E6/uL (ref 3.77–5.28)
RDW: 13.6 % (ref 11.7–15.4)
WBC: 6.8 10*3/uL (ref 3.4–10.8)

## 2023-06-30 LAB — COMPREHENSIVE METABOLIC PANEL WITH GFR
ALT: 22 IU/L (ref 0–32)
AST: 22 IU/L (ref 0–40)
Albumin: 4.5 g/dL (ref 3.8–4.8)
Alkaline Phosphatase: 110 IU/L (ref 44–121)
BUN/Creatinine Ratio: 24 (ref 12–28)
BUN: 18 mg/dL (ref 8–27)
Bilirubin Total: 0.4 mg/dL (ref 0.0–1.2)
CO2: 24 mmol/L (ref 20–29)
Calcium: 9.7 mg/dL (ref 8.7–10.3)
Chloride: 102 mmol/L (ref 96–106)
Creatinine, Ser: 0.74 mg/dL (ref 0.57–1.00)
Globulin, Total: 2.3 g/dL (ref 1.5–4.5)
Glucose: 97 mg/dL (ref 70–99)
Potassium: 5.3 mmol/L — ABNORMAL HIGH (ref 3.5–5.2)
Sodium: 140 mmol/L (ref 134–144)
Total Protein: 6.8 g/dL (ref 6.0–8.5)
eGFR: 85 mL/min/{1.73_m2} (ref >59)

## 2023-06-30 LAB — HEMOGLOBIN A1C
Est. average glucose Bld gHb Est-mCnc: 140 mg/dL
Hgb A1c MFr Bld: 6.5 % — ABNORMAL HIGH (ref 4.8–5.6)

## 2023-07-01 DIAGNOSIS — M6281 Muscle weakness (generalized): Secondary | ICD-10-CM | POA: Diagnosis not present

## 2023-07-01 DIAGNOSIS — M5489 Other dorsalgia: Secondary | ICD-10-CM | POA: Diagnosis not present

## 2023-07-03 DIAGNOSIS — F331 Major depressive disorder, recurrent, moderate: Secondary | ICD-10-CM | POA: Insufficient documentation

## 2023-07-03 NOTE — Assessment & Plan Note (Signed)
 Patient is being referred to the pain clinic by neurosurgery.  Hopefully she can get some relief.  Currently on Celebrex , gabapentin , methocarbamol , and Percocet.  Going to physical therapy also.SABRA

## 2023-07-03 NOTE — Assessment & Plan Note (Signed)
Continue Prolia 60 mg every 6 months, Vitamin D 1000 U q

## 2023-07-03 NOTE — Assessment & Plan Note (Signed)
 Patient is being referred to the pain clinic by neurosurgery.  Hopefully she can get some relief.  Currently on Celebrex , gabapentin , methocarbamol , and Percocet.  Going to physical therapy also.Virginia Montoya

## 2023-07-03 NOTE — Assessment & Plan Note (Addendum)
 Diabetes and hypertension well controlled.  Continue valsartan  40 mg daily.  Continue to work on eating a healthy diet and exercise.  Labs drawn today.

## 2023-07-03 NOTE — Assessment & Plan Note (Signed)
 The current medical regimen is effective;  continue present plan and medications. Continue on effexor  xr 150 mg one daily in am. Lorazepam  0.5 mg 1 pill three times a day as needed.

## 2023-07-03 NOTE — Assessment & Plan Note (Addendum)
 Well controlled.  No changes to medicines. crestor  20 mg before bed. Continue to work on eating a healthy diet and exercise.

## 2023-07-03 NOTE — Assessment & Plan Note (Signed)
 Controlled Continue current medication regimen. Gabapentin 100 mg by mouth THREE TIMES A DAY

## 2023-07-03 NOTE — Assessment & Plan Note (Signed)
 Fairly Well controlled.  Continue on current medicaitons.  Continue on effexor  xr 150 mg one daily in am. Lorazepam  0.5 mg 1 pill as needed during the day and patient takes 1 pill in evening. Takes ambien at night for sleep.

## 2023-07-03 NOTE — Assessment & Plan Note (Signed)
 The current medical regimen is effective;  continue present plan and medications. Continue omeprazole 20 mg daily.

## 2023-07-05 DIAGNOSIS — F32A Depression, unspecified: Secondary | ICD-10-CM | POA: Diagnosis not present

## 2023-07-05 DIAGNOSIS — F419 Anxiety disorder, unspecified: Secondary | ICD-10-CM | POA: Diagnosis not present

## 2023-07-05 DIAGNOSIS — M129 Arthropathy, unspecified: Secondary | ICD-10-CM | POA: Diagnosis not present

## 2023-07-05 DIAGNOSIS — S32020D Wedge compression fracture of second lumbar vertebra, subsequent encounter for fracture with routine healing: Secondary | ICD-10-CM | POA: Diagnosis not present

## 2023-07-05 DIAGNOSIS — Z79899 Other long term (current) drug therapy: Secondary | ICD-10-CM | POA: Diagnosis not present

## 2023-07-06 DIAGNOSIS — M5489 Other dorsalgia: Secondary | ICD-10-CM | POA: Diagnosis not present

## 2023-07-06 DIAGNOSIS — M6281 Muscle weakness (generalized): Secondary | ICD-10-CM | POA: Diagnosis not present

## 2023-07-11 DIAGNOSIS — M6281 Muscle weakness (generalized): Secondary | ICD-10-CM | POA: Diagnosis not present

## 2023-07-11 DIAGNOSIS — M5489 Other dorsalgia: Secondary | ICD-10-CM | POA: Diagnosis not present

## 2023-07-12 ENCOUNTER — Telehealth: Payer: Self-pay | Admitting: Family Medicine

## 2023-07-12 NOTE — Telephone Encounter (Signed)
 Patient called and requested to speak with Dr.Cox this afternoon for some advice. [phone].

## 2023-07-13 DIAGNOSIS — M8588 Other specified disorders of bone density and structure, other site: Secondary | ICD-10-CM | POA: Diagnosis not present

## 2023-07-13 DIAGNOSIS — S32020D Wedge compression fracture of second lumbar vertebra, subsequent encounter for fracture with routine healing: Secondary | ICD-10-CM | POA: Diagnosis not present

## 2023-07-13 DIAGNOSIS — R030 Elevated blood-pressure reading, without diagnosis of hypertension: Secondary | ICD-10-CM | POA: Diagnosis not present

## 2023-07-13 DIAGNOSIS — R29818 Other symptoms and signs involving the nervous system: Secondary | ICD-10-CM | POA: Diagnosis not present

## 2023-07-13 DIAGNOSIS — F419 Anxiety disorder, unspecified: Secondary | ICD-10-CM | POA: Diagnosis not present

## 2023-07-13 DIAGNOSIS — R7989 Other specified abnormal findings of blood chemistry: Secondary | ICD-10-CM | POA: Diagnosis not present

## 2023-07-13 NOTE — Telephone Encounter (Signed)
 Left message for patient to return call.

## 2023-07-18 DIAGNOSIS — F332 Major depressive disorder, recurrent severe without psychotic features: Secondary | ICD-10-CM | POA: Diagnosis not present

## 2023-07-18 DIAGNOSIS — R0602 Shortness of breath: Secondary | ICD-10-CM | POA: Diagnosis not present

## 2023-07-18 DIAGNOSIS — F331 Major depressive disorder, recurrent, moderate: Secondary | ICD-10-CM | POA: Diagnosis not present

## 2023-07-18 DIAGNOSIS — F3340 Major depressive disorder, recurrent, in remission, unspecified: Secondary | ICD-10-CM | POA: Diagnosis not present

## 2023-07-18 DIAGNOSIS — F322 Major depressive disorder, single episode, severe without psychotic features: Secondary | ICD-10-CM | POA: Diagnosis not present

## 2023-07-18 DIAGNOSIS — F321 Major depressive disorder, single episode, moderate: Secondary | ICD-10-CM | POA: Diagnosis not present

## 2023-07-18 DIAGNOSIS — F419 Anxiety disorder, unspecified: Secondary | ICD-10-CM | POA: Diagnosis not present

## 2023-07-18 DIAGNOSIS — F323 Major depressive disorder, single episode, severe with psychotic features: Secondary | ICD-10-CM | POA: Diagnosis not present

## 2023-07-18 DIAGNOSIS — Z79899 Other long term (current) drug therapy: Secondary | ICD-10-CM | POA: Diagnosis not present

## 2023-07-18 DIAGNOSIS — F324 Major depressive disorder, single episode, in partial remission: Secondary | ICD-10-CM | POA: Diagnosis not present

## 2023-07-18 DIAGNOSIS — F330 Major depressive disorder, recurrent, mild: Secondary | ICD-10-CM | POA: Diagnosis not present

## 2023-07-19 DIAGNOSIS — M6281 Muscle weakness (generalized): Secondary | ICD-10-CM | POA: Diagnosis not present

## 2023-07-19 DIAGNOSIS — M5489 Other dorsalgia: Secondary | ICD-10-CM | POA: Diagnosis not present

## 2023-07-22 DIAGNOSIS — M6281 Muscle weakness (generalized): Secondary | ICD-10-CM | POA: Diagnosis not present

## 2023-07-22 DIAGNOSIS — M5489 Other dorsalgia: Secondary | ICD-10-CM | POA: Diagnosis not present

## 2023-07-25 DIAGNOSIS — M5489 Other dorsalgia: Secondary | ICD-10-CM | POA: Diagnosis not present

## 2023-07-25 DIAGNOSIS — M6281 Muscle weakness (generalized): Secondary | ICD-10-CM | POA: Diagnosis not present

## 2023-07-27 DIAGNOSIS — M8588 Other specified disorders of bone density and structure, other site: Secondary | ICD-10-CM | POA: Diagnosis not present

## 2023-07-27 DIAGNOSIS — Z79899 Other long term (current) drug therapy: Secondary | ICD-10-CM | POA: Diagnosis not present

## 2023-07-27 DIAGNOSIS — R399 Unspecified symptoms and signs involving the genitourinary system: Secondary | ICD-10-CM | POA: Diagnosis not present

## 2023-07-27 DIAGNOSIS — S32020D Wedge compression fracture of second lumbar vertebra, subsequent encounter for fracture with routine healing: Secondary | ICD-10-CM | POA: Diagnosis not present

## 2023-07-27 DIAGNOSIS — R29818 Other symptoms and signs involving the nervous system: Secondary | ICD-10-CM | POA: Diagnosis not present

## 2023-07-27 DIAGNOSIS — I10 Essential (primary) hypertension: Secondary | ICD-10-CM | POA: Diagnosis not present

## 2023-07-27 DIAGNOSIS — E663 Overweight: Secondary | ICD-10-CM | POA: Diagnosis not present

## 2023-07-27 DIAGNOSIS — R300 Dysuria: Secondary | ICD-10-CM | POA: Diagnosis not present

## 2023-07-27 DIAGNOSIS — F419 Anxiety disorder, unspecified: Secondary | ICD-10-CM | POA: Diagnosis not present

## 2023-07-27 DIAGNOSIS — R030 Elevated blood-pressure reading, without diagnosis of hypertension: Secondary | ICD-10-CM | POA: Diagnosis not present

## 2023-08-01 ENCOUNTER — Ambulatory Visit

## 2023-08-01 DIAGNOSIS — Z79899 Other long term (current) drug therapy: Secondary | ICD-10-CM | POA: Diagnosis not present

## 2023-08-01 DIAGNOSIS — Z6826 Body mass index (BMI) 26.0-26.9, adult: Secondary | ICD-10-CM | POA: Diagnosis not present

## 2023-08-01 DIAGNOSIS — K219 Gastro-esophageal reflux disease without esophagitis: Secondary | ICD-10-CM | POA: Diagnosis not present

## 2023-08-01 DIAGNOSIS — R1310 Dysphagia, unspecified: Secondary | ICD-10-CM | POA: Diagnosis not present

## 2023-08-01 DIAGNOSIS — S32020D Wedge compression fracture of second lumbar vertebra, subsequent encounter for fracture with routine healing: Secondary | ICD-10-CM | POA: Diagnosis not present

## 2023-08-01 DIAGNOSIS — R29818 Other symptoms and signs involving the nervous system: Secondary | ICD-10-CM | POA: Diagnosis not present

## 2023-08-02 DIAGNOSIS — M5489 Other dorsalgia: Secondary | ICD-10-CM | POA: Diagnosis not present

## 2023-08-02 DIAGNOSIS — M6281 Muscle weakness (generalized): Secondary | ICD-10-CM | POA: Diagnosis not present

## 2023-08-03 ENCOUNTER — Ambulatory Visit: Payer: Self-pay

## 2023-08-03 NOTE — Telephone Encounter (Signed)
 FYI Only or Action Required?: Action required by provider: update on patient condition.  Patient was last seen in primary care on 06/29/2023 by Sherre Clapper, MD.  Called Nurse Triage reporting Chest Pain.  Symptoms began several days ago.  Interventions attempted: Prescription medications: famotidine , omeprazole .  Symptoms are: belching, sour taste in mouth, regurgitation, throat discomfort, hard to breathe in when feeling the pain left arm pain/chest pain/jaw/neck pain resolved since Monday but states feels like it is worsening since last night.  Triage Disposition: Go to ED Now (or PCP Triage), also advised patient to call her cardiologist and she refused  Patient/caregiver understands and will follow disposition?: No, wishes to speak with PCP          Copied from CRM #[id number]. Topic: Clinical - Red Word Triage >> Aug 03, 2023 10:20 AM Tobias L wrote: Red Word that prompted transfer to Nurse Triage:  left arm aching, come on up to chest, top of back & up throat & jaw, around eyes. Patient has had a heart work up but has had the pain Reason for Disposition  Taking a deep breath makes pain worse  Answer Assessment - Initial Assessment Questions Patient states she does not think this is cardiac, she states May 2025 she was sent to cardiology for a work up and everything was fine with her heart. Advised ED and patient refused, became tearful. Patient also refused advise to call her cardiologist. Called [facility] and notified staff.  1. LOCATION: Where does it hurt?       She states it starts as an ache in her left arm, which stops then goes into the chest. Top of chest closer to collarbone.  2. RADIATION: Does the pain go anywhere else? (e.g., into neck, jaw, arms, back)     Left arm, throat, jaw, top of back, neck  3. ONSET: When did the chest pain begin? (Minutes, hours or days)      Thursday through Sunday, improved since Monday. She states it seems like it was starting  back up last evening.  4. PATTERN: Does the pain come and go, or has it been constant since it started?  Does it get worse with exertion?      Comes and goes today. At times it hurts to breathe in. Worse when leaning forward or backwards.  5. DURATION: How long does it last (e.g., seconds, minutes, hours)     1-3 minutes.  6. SEVERITY: How bad is the pain?  (e.g., Scale 1-10; mild, moderate, or severe)     Not present at this time.  7. CARDIAC RISK FACTORS: Do you have any history of heart problems or risk factors for heart disease? (e.g., angina, prior heart attack; diabetes, high blood pressure, high cholesterol, smoker, or strong family history of heart disease)     Hypertension, DM2.  8. PULMONARY RISK FACTORS: Do you have any history of lung disease?  (e.g., blood clots in lung, asthma, emphysema, birth control pills)     No.  9. CAUSE: What do you think is causing the chest pain?     She states she thinks it is acid reflux, she states she has a history of it but not like this.  10. OTHER SYMPTOMS: Do you have any other symptoms? (e.g., dizziness, nausea, vomiting, sweating, fever, difficulty breathing, cough)       Belching, sour taste in mouth, not difficulty but soreness on outside of throat when swallowing, regurgitation, hard to breathe in when feeling the pain, mild  dizziness since December (she attributes as side effects of medications)  11. PREGNANCY: Is there any chance you are pregnant? When was your last menstrual period?       N/A.  Protocols used: Chest Pain-A-AH

## 2023-08-03 NOTE — Telephone Encounter (Signed)
 Patient called back and spoke with this RN---This RN gave her the message that was left for her---Patient didn't listen to it first  Patient states that she feels fine now and she just felt bad last weekend---She thinks it was just reflux & she states that if it starts to happen again like it did last weekend she will go to the Emergency Room  This RN attempted to call [facility] to inform them that patient called back, message was relayed to her verbatim:  Attempted to call patient, left message informing her she needs to go to the ED as advised by the Triage nurse and Dr. Sherre also agrees with this recommendation. (No answer at [facility] around 12:35 PM)  Patient was advised that it was strongly encouraged that she goes to the Emergency Room to be evaluated for this chest pain and she states that she appreciated the phone call and she would think about it but at this time she states she felt fine. This RN advised her that I would let her provider know.

## 2023-08-03 NOTE — Telephone Encounter (Signed)
 Attempted to call patient, left message informing her she needs to go to the ED as advised by the Triage nurse and Dr. Sherre also agrees with this recommendation.

## 2023-08-04 DIAGNOSIS — H353221 Exudative age-related macular degeneration, left eye, with active choroidal neovascularization: Secondary | ICD-10-CM | POA: Diagnosis not present

## 2023-08-05 ENCOUNTER — Ambulatory Visit (INDEPENDENT_AMBULATORY_CARE_PROVIDER_SITE_OTHER)

## 2023-08-05 DIAGNOSIS — M810 Age-related osteoporosis without current pathological fracture: Secondary | ICD-10-CM

## 2023-08-05 DIAGNOSIS — M6281 Muscle weakness (generalized): Secondary | ICD-10-CM | POA: Diagnosis not present

## 2023-08-05 DIAGNOSIS — M5489 Other dorsalgia: Secondary | ICD-10-CM | POA: Diagnosis not present

## 2023-08-05 MED ORDER — DENOSUMAB 60 MG/ML ~~LOC~~ SOSY
60.0000 mg | PREFILLED_SYRINGE | Freq: Once | SUBCUTANEOUS | Status: AC
  Administered 2023-08-05: 60 mg via SUBCUTANEOUS

## 2023-08-05 NOTE — Progress Notes (Signed)
   Patient: Virginia Montoya  DOB: 31-Mar-1950  MRN: [id number]    Visit Date: 08/05/2023    CACEY WILLOW presents today for her six month Prolia  injection.  1 x 60 mg Single-Dose Prefilled Syringe was given SQ in the left arm.  Patient tolerated the injection well and has no questions.  The next Prolia  injection will be due in six months.      Tinnie CHRISTELLA Clover, CMA

## 2023-08-09 DIAGNOSIS — M6281 Muscle weakness (generalized): Secondary | ICD-10-CM | POA: Diagnosis not present

## 2023-08-09 DIAGNOSIS — M5489 Other dorsalgia: Secondary | ICD-10-CM | POA: Diagnosis not present

## 2023-08-10 DIAGNOSIS — Z9181 History of falling: Secondary | ICD-10-CM | POA: Diagnosis not present

## 2023-08-10 DIAGNOSIS — M810 Age-related osteoporosis without current pathological fracture: Secondary | ICD-10-CM | POA: Diagnosis not present

## 2023-08-10 DIAGNOSIS — R1310 Dysphagia, unspecified: Secondary | ICD-10-CM | POA: Diagnosis not present

## 2023-08-10 DIAGNOSIS — Z6826 Body mass index (BMI) 26.0-26.9, adult: Secondary | ICD-10-CM | POA: Diagnosis not present

## 2023-08-10 DIAGNOSIS — R1319 Other dysphagia: Secondary | ICD-10-CM | POA: Diagnosis not present

## 2023-08-10 DIAGNOSIS — Z800 Family history of malignant neoplasm of digestive organs: Secondary | ICD-10-CM | POA: Diagnosis not present

## 2023-08-10 DIAGNOSIS — R7401 Elevation of levels of liver transaminase levels: Secondary | ICD-10-CM | POA: Diagnosis not present

## 2023-08-12 DIAGNOSIS — M5489 Other dorsalgia: Secondary | ICD-10-CM | POA: Diagnosis not present

## 2023-08-12 DIAGNOSIS — M6281 Muscle weakness (generalized): Secondary | ICD-10-CM | POA: Diagnosis not present

## 2023-08-16 DIAGNOSIS — F330 Major depressive disorder, recurrent, mild: Secondary | ICD-10-CM | POA: Diagnosis not present

## 2023-08-16 DIAGNOSIS — Z79899 Other long term (current) drug therapy: Secondary | ICD-10-CM | POA: Diagnosis not present

## 2023-08-16 DIAGNOSIS — F419 Anxiety disorder, unspecified: Secondary | ICD-10-CM | POA: Diagnosis not present

## 2023-08-22 DIAGNOSIS — M5489 Other dorsalgia: Secondary | ICD-10-CM | POA: Diagnosis not present

## 2023-08-22 DIAGNOSIS — M6281 Muscle weakness (generalized): Secondary | ICD-10-CM | POA: Diagnosis not present

## 2023-08-24 DIAGNOSIS — K200 Eosinophilic esophagitis: Secondary | ICD-10-CM | POA: Diagnosis not present

## 2023-08-24 DIAGNOSIS — Z1211 Encounter for screening for malignant neoplasm of colon: Secondary | ICD-10-CM | POA: Diagnosis not present

## 2023-08-24 DIAGNOSIS — R1319 Other dysphagia: Secondary | ICD-10-CM | POA: Diagnosis not present

## 2023-08-26 DIAGNOSIS — M8588 Other specified disorders of bone density and structure, other site: Secondary | ICD-10-CM | POA: Diagnosis not present

## 2023-08-26 DIAGNOSIS — F419 Anxiety disorder, unspecified: Secondary | ICD-10-CM | POA: Diagnosis not present

## 2023-08-26 DIAGNOSIS — S32020D Wedge compression fracture of second lumbar vertebra, subsequent encounter for fracture with routine healing: Secondary | ICD-10-CM | POA: Diagnosis not present

## 2023-08-26 DIAGNOSIS — R29818 Other symptoms and signs involving the nervous system: Secondary | ICD-10-CM | POA: Diagnosis not present

## 2023-08-26 DIAGNOSIS — Z79899 Other long term (current) drug therapy: Secondary | ICD-10-CM | POA: Diagnosis not present

## 2023-08-26 DIAGNOSIS — I10 Essential (primary) hypertension: Secondary | ICD-10-CM | POA: Diagnosis not present

## 2023-08-26 DIAGNOSIS — E663 Overweight: Secondary | ICD-10-CM | POA: Diagnosis not present

## 2023-08-26 DIAGNOSIS — R030 Elevated blood-pressure reading, without diagnosis of hypertension: Secondary | ICD-10-CM | POA: Diagnosis not present

## 2023-09-06 DIAGNOSIS — F419 Anxiety disorder, unspecified: Secondary | ICD-10-CM | POA: Diagnosis not present

## 2023-09-06 DIAGNOSIS — F5104 Psychophysiologic insomnia: Secondary | ICD-10-CM | POA: Diagnosis not present

## 2023-09-06 DIAGNOSIS — Z79899 Other long term (current) drug therapy: Secondary | ICD-10-CM | POA: Diagnosis not present

## 2023-09-06 DIAGNOSIS — F330 Major depressive disorder, recurrent, mild: Secondary | ICD-10-CM | POA: Diagnosis not present

## 2023-09-13 DIAGNOSIS — Z79899 Other long term (current) drug therapy: Secondary | ICD-10-CM | POA: Diagnosis not present

## 2023-09-13 DIAGNOSIS — R7401 Elevation of levels of liver transaminase levels: Secondary | ICD-10-CM | POA: Diagnosis not present

## 2023-09-14 DIAGNOSIS — Z79899 Other long term (current) drug therapy: Secondary | ICD-10-CM | POA: Diagnosis not present

## 2023-09-15 DIAGNOSIS — H353221 Exudative age-related macular degeneration, left eye, with active choroidal neovascularization: Secondary | ICD-10-CM | POA: Diagnosis not present

## 2023-09-16 ENCOUNTER — Other Ambulatory Visit: Payer: Self-pay | Admitting: Family Medicine

## 2023-09-20 DIAGNOSIS — F419 Anxiety disorder, unspecified: Secondary | ICD-10-CM | POA: Diagnosis not present

## 2023-09-20 DIAGNOSIS — F330 Major depressive disorder, recurrent, mild: Secondary | ICD-10-CM | POA: Diagnosis not present

## 2023-09-20 DIAGNOSIS — F5104 Psychophysiologic insomnia: Secondary | ICD-10-CM | POA: Diagnosis not present

## 2023-09-20 DIAGNOSIS — Z79899 Other long term (current) drug therapy: Secondary | ICD-10-CM | POA: Diagnosis not present

## 2023-09-26 DIAGNOSIS — Z79899 Other long term (current) drug therapy: Secondary | ICD-10-CM | POA: Diagnosis not present

## 2023-09-26 DIAGNOSIS — R29818 Other symptoms and signs involving the nervous system: Secondary | ICD-10-CM | POA: Diagnosis not present

## 2023-09-26 DIAGNOSIS — Z6826 Body mass index (BMI) 26.0-26.9, adult: Secondary | ICD-10-CM | POA: Diagnosis not present

## 2023-09-26 DIAGNOSIS — Z0000 Encounter for general adult medical examination without abnormal findings: Secondary | ICD-10-CM | POA: Diagnosis not present

## 2023-09-26 DIAGNOSIS — M8588 Other specified disorders of bone density and structure, other site: Secondary | ICD-10-CM | POA: Diagnosis not present

## 2023-09-26 DIAGNOSIS — F419 Anxiety disorder, unspecified: Secondary | ICD-10-CM | POA: Diagnosis not present

## 2023-09-26 DIAGNOSIS — I10 Essential (primary) hypertension: Secondary | ICD-10-CM | POA: Diagnosis not present

## 2023-09-26 DIAGNOSIS — S32020D Wedge compression fracture of second lumbar vertebra, subsequent encounter for fracture with routine healing: Secondary | ICD-10-CM | POA: Diagnosis not present

## 2023-09-28 ENCOUNTER — Ambulatory Visit (INDEPENDENT_AMBULATORY_CARE_PROVIDER_SITE_OTHER)

## 2023-09-28 DIAGNOSIS — Z23 Encounter for immunization: Secondary | ICD-10-CM

## 2023-10-04 DIAGNOSIS — K219 Gastro-esophageal reflux disease without esophagitis: Secondary | ICD-10-CM | POA: Diagnosis not present

## 2023-10-04 DIAGNOSIS — K449 Diaphragmatic hernia without obstruction or gangrene: Secondary | ICD-10-CM | POA: Diagnosis not present

## 2023-10-06 DIAGNOSIS — F32A Depression, unspecified: Secondary | ICD-10-CM | POA: Diagnosis not present

## 2023-10-06 DIAGNOSIS — S32020D Wedge compression fracture of second lumbar vertebra, subsequent encounter for fracture with routine healing: Secondary | ICD-10-CM | POA: Diagnosis not present

## 2023-10-06 DIAGNOSIS — I10 Essential (primary) hypertension: Secondary | ICD-10-CM | POA: Diagnosis not present

## 2023-10-06 DIAGNOSIS — F419 Anxiety disorder, unspecified: Secondary | ICD-10-CM | POA: Diagnosis not present

## 2023-10-06 DIAGNOSIS — E7800 Pure hypercholesterolemia, unspecified: Secondary | ICD-10-CM | POA: Diagnosis not present

## 2023-10-06 DIAGNOSIS — G4710 Hypersomnia, unspecified: Secondary | ICD-10-CM | POA: Diagnosis not present

## 2023-10-11 DIAGNOSIS — F5104 Psychophysiologic insomnia: Secondary | ICD-10-CM | POA: Diagnosis not present

## 2023-10-11 DIAGNOSIS — Z79899 Other long term (current) drug therapy: Secondary | ICD-10-CM | POA: Diagnosis not present

## 2023-10-11 DIAGNOSIS — F330 Major depressive disorder, recurrent, mild: Secondary | ICD-10-CM | POA: Diagnosis not present

## 2023-10-11 DIAGNOSIS — F419 Anxiety disorder, unspecified: Secondary | ICD-10-CM | POA: Diagnosis not present

## 2023-10-11 DIAGNOSIS — Z6827 Body mass index (BMI) 27.0-27.9, adult: Secondary | ICD-10-CM | POA: Diagnosis not present

## 2023-10-12 ENCOUNTER — Ambulatory Visit: Admitting: Family Medicine

## 2023-10-23 DIAGNOSIS — I10 Essential (primary) hypertension: Secondary | ICD-10-CM | POA: Insufficient documentation

## 2023-10-23 NOTE — Assessment & Plan Note (Addendum)
 Depression managed with venlafaxine  and Vraylar. Seeing psychiatrist. Lorazepam  used as needed for anxiety. - Continue venlafaxine  xr 150 mg once daily in am and Vraylar 1.5 mg daily.  - Continue lorazepam  0.5 mg three times a day as needed.  Orders:   AMB Referral [facility] Care Management

## 2023-10-23 NOTE — Assessment & Plan Note (Signed)
 Virginia Montoya

## 2023-10-23 NOTE — Progress Notes (Signed)
 Subjective:  Patient ID: Virginia Montoya, female    DOB: December 04, 1950  Age: 73 y.o. MRN: [id number]  Chief Complaint  Patient presents with   Medical Management of Chronic Issues    HPI: Discussed the use of AI scribe software for clinical note transcription with the patient, who gave verbal consent to proceed.  History of Present Illness Virginia Montoya is a 73 year old female with diabetes and hypertension who presents for a follow-up visit.  Glycemic control - Diabetes mellitus with recent A1c decrease from 6.5 to 6.3 - Maintains a healthy diet and exercise routine through caregiving activities  Lipid management - LDL cholesterol decreased from 70 to 47 - Total cholesterol decreased from 148 to 126 - Triglycerides increased to 181  Hypertension - Takes valsartan  40 mg daily - Recent blood pressure reading of 128/72  Antiplatelet therapy - Takes aspirin  81 mg daily - Recently forgot to take aspirin   Depressive symptoms and psychiatric care - History of depression under psychiatric care - Current medications: lorazepam  0.5 mg three times a day as needed, venlafaxine  150 mg daily, Vraylar 1.5 mg daily - Finds Vraylar beneficial but notes it is expensive; receives samples from psychiatrist - States she is 'doing good' and is seeing a psychiatrist  Gastroesophageal reflux and hiatal hernia - Experiences heartburn - Diagnosed with hiatal hernia - Takes omeprazole  20 mg daily, feels it is insufficient - Previously took famotidine  in the evening, discontinued by gastroenterologist  Chronic pain and musculoskeletal symptoms - Under care of pain management specialist - Takes Percocet four times a day for pain and methocarbamol  as a muscle relaxant - Also on celebrex  200 mg daily.  - No pain at the time of visit, but pain increases by evening - Requesting I take over pain management.  - The pain management provider at [facility] referred her to psychiatry, gastroenterology and  pulmonology at [facility].   Paresthesias - Occasional sensations of 'bugs crawling' on feet and hands, occurring infrequently - Uncertain of the medication prescribed for this symptom  Constitutional and other symptoms - No fevers, chills, sweats, earache, sore throat, stuffy nose, chest pain, breathing problems, bladder issues, or bowel problems       10/30/2023    4:17 PM 06/29/2023   11:08 AM 04/28/2023    2:33 PM 03/24/2023    9:05 AM 12/24/2022   10:12 AM  Depression screen PHQ 2/9  Decreased Interest 0 0 1  0  Down, Depressed, Hopeless 0 1 1 3  0  PHQ - 2 Score 0 1 2 3  0  Altered sleeping 0 1 0 0 0  Tired, decreased energy 0 0 3 1 0  Change in appetite 0 0 0 0 0  Feeling bad or failure about yourself  0 1 0 0 0  Trouble concentrating 0 0 0 0 0  Moving slowly or fidgety/restless 0 2 1 0 0  Suicidal thoughts 0 0 0 0 0  PHQ-9 Score 0 5 6 4  0  Difficult doing work/chores Not difficult at all Extremely dIfficult Somewhat difficult Somewhat difficult Not difficult at all        03/24/2023    9:04 AM  Fall Risk   Falls in the past year? 0  Number falls in past yr: 0  Injury with Fall? 0  Risk for fall due to : No Fall Risks  Follow up Falls evaluation completed    Patient Care Team: Sherre Clapper, MD as PCP - General (Family Medicine) Erasmo Motto  E, OD (Optometry)   Review of Systems  All other systems reviewed and are negative.   Current Outpatient Medications on File Prior to Visit  Medication Sig Dispense Refill   aspirin  EC 81 MG tablet Take 1 tablet (81 mg total) by mouth daily. Swallow whole.     cariprazine (VRAYLAR) 1.5 MG capsule Take 1.5 mg by mouth daily.     celecoxib  (CELEBREX ) 200 MG capsule TAKE 1 CAPSULE(200 MG) BY MOUTH DAILY 30 capsule 1   cholecalciferol (VITAMIN D3) 25 MCG (1000 UNIT) tablet Take 1,000 Units by mouth 2 (two) times daily.     diclofenac  Sodium (VOLTAREN ) 1 % GEL 4 gms four times a day as needed hip pain 350 g 1   famotidine  (PEPCID )  20 MG tablet TAKE 1 TABLET(20 MG) BY MOUTH TWICE DAILY 180 tablet 3   fluticasone  (FLONASE ) 50 MCG/ACT nasal spray SHAKE LIQUID AND USE 2 SPRAYS IN EACH NOSTRIL DAILY 16 g 6   gabapentin  (NEURONTIN ) 100 MG capsule Take 100 mg by mouth at bedtime.     LORazepam  (ATIVAN ) 0.5 MG tablet TAKE 1 TABLET(0.5 MG) BY MOUTH EVERY 8 HOURS AS NEEDED FOR ANXIETY 90 tablet 2   lubiprostone  (AMITIZA ) 24 MCG capsule Take 1 capsule (24 mcg total) by mouth 2 (two) times daily with a meal. 60 capsule 2   methocarbamol  (ROBAXIN ) 500 MG tablet Take 500 mg by mouth 4 (four) times daily.     Multiple Vitamins-Minerals (PRESERVISION AREDS 2 PO) Take by mouth.     nitroGLYCERIN  (NITROSTAT ) 0.4 MG SL tablet PLACE 1 TABLET UNDER THE TONGUE EVERY 5 MINUTES AS NEEDED FOR CHEST PAIN 25 tablet 0   oxyCODONE -acetaminophen  (PERCOCET/ROXICET) 5-325 MG tablet Take 2 tablets by mouth 2 (two) times daily.     PROLIA  60 MG/ML SOSY injection Inject into the skin.     rosuvastatin  (CRESTOR ) 20 MG tablet TAKE 1 TABLET(20 MG) BY MOUTH AT BEDTIME 90 tablet 0   valsartan  (DIOVAN ) 40 MG tablet TAKE 1 TABLET(40 MG) BY MOUTH DAILY 90 tablet 3   venlafaxine  XR (EFFEXOR -XR) 150 MG 24 hr capsule TAKE 1 CAPSULE BY MOUTH DAILY 90 capsule 1   vitamin B-12 (CYANOCOBALAMIN) 1000 MCG tablet Take 1,000 mcg by mouth daily.     No current facility-administered medications on file prior to visit.   Past Medical History:  Diagnosis Date   Chronic insomnia    Depression    Gastroesophageal reflux disease    Hyperlipidemia    Osteoporosis    Precordial chest pain 11/17/2016   Prediabetes    Past Surgical History:  Procedure Laterality Date   CHOLECYSTECTOMY  1999   CORONARY CALCIUM  SCORE/CORONARY CTA  01/2017   Coronary calcium  score is 0.  Normal coronary origin with normal right dominance.  No evidence of CAD.  Most likely noncardiac chest pain.   IR KYPHO LUMBAR INC FX REDUCE BONE BX UNI/BIL CANNULATION INC/IMAGING  01/14/2023   IR KYPHO  THORACIC WITH BONE BIOPSY  01/14/2023   SHOULDER SURGERY  2005   TONSILLECTOMY     TUBAL LIGATION  1977    Family History  Problem Relation Age of Onset   Diabetes Mother    Hyperlipidemia Mother    Emphysema Father    Diabetes Maternal Grandmother    Liver cancer Maternal Grandmother    Social History   Socioeconomic History   Marital status: Married    Spouse name: Jerilynn   Number of children: 3   Years of education: 26  Highest education level: Associate degree: academic program  Occupational History   Occupation: Retired  Tobacco Use   Smoking status: Never   Smokeless tobacco: Never  Vaping Use   Vaping status: Never Used  Substance and Sexual Activity   Alcohol use: No   Drug use: No   Sexual activity: Not Currently  Other Topics Concern   Not on file  Social History Narrative   She is a married (remarried) mother of 3 with 4 grandchildren.  She has 3 great-grandchildren.  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  She completed high school and beauty school.   She works outside a lot in the yard on gardening.  She does a lot of walking and exercises with walking for at least a half an hour a day 5 days a week.   wears sunscreen, brushes and flosses daily, see's dentist bi-annually, has smoke/carbon monoxide detectors, wears a seatbelt and practices gun safety   Social Drivers of Health   Financial Resource Strain: Low Risk  (06/29/2023)   Overall Financial Resource Strain (CARDIA)    Difficulty of Paying Living Expenses: Not hard at all  Food Insecurity: No Food Insecurity (06/29/2023)   Hunger Vital Sign    Worried About Running Out of Food in the Last Year: Never true    Ran Out of Food in the Last Year: Never true  Transportation Needs: No Transportation Needs (06/29/2023)   PRAPARE - Administrator, Civil Service (Medical): No    Lack of Transportation (Non-Medical): No  Physical Activity: Insufficiently Active (06/29/2023)    Exercise Vital Sign    Days of Exercise per Week: 2 days    Minutes of Exercise per Session: 60 min  Stress: No Stress Concern Present (06/29/2023)   [employer] of Occupational Health - Occupational Stress Questionnaire    Feeling of Stress: Not at all  Social Connections: Moderately Integrated (06/29/2023)   Social Connection and Isolation Panel    Frequency of Communication with Friends and Family: More than three times a week    Frequency of Social Gatherings with Friends and Family: Three times a week    Attends Religious Services: More than 4 times per year    Active Member of Clubs or Organizations: No    Attends Banker Meetings: Never    Marital Status: Married    Objective:  BP 128/72   Pulse 65   Temp 97.8 F (36.6 C)   Ht 5' 6 (1.676 m)   Wt 165 lb (74.8 kg)   LMP  (LMP Unknown)   SpO2 100%   BMI 26.63 kg/m      10/24/2023    9:04 AM 06/29/2023   11:06 AM 06/07/2023    9:38 AM  BP/Weight  Systolic BP 128 128 128  Diastolic BP 72 80 57  Wt. (Lbs) 165 159   BMI 26.63 kg/m2 25.66 kg/m2     Physical Exam Vitals reviewed.  Constitutional:      Appearance: Normal appearance. She is normal weight.  Neck:     Vascular: No carotid bruit.  Cardiovascular:     Rate and Rhythm: Normal rate and regular rhythm.     Heart sounds: Normal heart sounds.  Pulmonary:     Effort: Pulmonary effort is normal. No respiratory distress.     Breath sounds: Normal breath sounds.  Abdominal:     General: Abdomen is flat. Bowel sounds are normal.     Palpations: Abdomen is  soft.     Tenderness: There is no abdominal tenderness.  Musculoskeletal:        General: Tenderness (upper lumbar.) present.  Neurological:     Mental Status: She is alert and oriented to person, place, and time.  Psychiatric:        Mood and Affect: Mood normal.        Behavior: Behavior normal.         Lab Results  Component Value Date   WBC 6.8 06/29/2023   HGB 14.5  06/29/2023   HCT 43.7 06/29/2023   PLT 334 06/29/2023   GLUCOSE 106 (H) 10/24/2023   CHOL 148 03/24/2023   TRIG 148 03/24/2023   HDL 53 03/24/2023   LDLCALC 70 03/24/2023   ALT 11 10/24/2023   AST 18 10/24/2023   NA 140 10/24/2023   K 4.6 10/24/2023   CL 100 10/24/2023   CREATININE 0.71 10/24/2023   BUN 13 10/24/2023   CO2 25 10/24/2023   TSH 2.010 05/03/2022   INR 1.0 01/14/2023   HGBA1C 6.3 10/24/2023    Results for orders placed or performed in visit on 10/24/23  POCT Lipid Panel   Collection Time: 10/24/23  9:18 AM  Result Value Ref Range   TC 126    HDL 43    TRG 181    LDL 47    Non-HDL 83    TC/HDL    POCT glycosylated hemoglobin (Hb A1C)   Collection Time: 10/24/23  9:18 AM  Result Value Ref Range   Hemoglobin A1C     HbA1c POC (<> result, manual entry) 6.3 4.0 - 5.6 %   HbA1c, POC (prediabetic range)     HbA1c, POC (controlled diabetic range)    Comprehensive metabolic panel with GFR   Collection Time: 10/24/23 10:15 AM  Result Value Ref Range   Glucose 106 (H) 70 - 99 mg/dL   BUN 13 8 - 27 mg/dL   Creatinine, Ser 9.28 0.57 - 1.00 mg/dL   eGFR 89 >40 fO/fpw/8.26   BUN/Creatinine Ratio 18 12 - 28   Sodium 140 134 - 144 mmol/L   Potassium 4.6 3.5 - 5.2 mmol/L   Chloride 100 96 - 106 mmol/L   CO2 25 20 - 29 mmol/L   Calcium  9.2 8.7 - 10.3 mg/dL   Total Protein 6.6 6.0 - 8.5 g/dL   Albumin 4.3 3.8 - 4.8 g/dL   Globulin, Total 2.3 1.5 - 4.5 g/dL   Bilirubin Total 0.4 0.0 - 1.2 mg/dL   Alkaline Phosphatase 103 49 - 135 IU/L   AST 18 0 - 40 IU/L   ALT 11 0 - 32 IU/L  Microalbumin / creatinine urine ratio   Collection Time: 10/24/23 10:15 AM  Result Value Ref Range   Creatinine, Urine 85.9 Not Estab. mg/dL   Microalbumin, Urine 8.4 Not Estab. ug/mL   Microalb/Creat Ratio 10 0 - 29 mg/g creat  .  Assessment & Plan:   Assessment & Plan Mixed hyperlipidemia Cholesterol levels improved. Total cholesterol decreased, LDL decreased, HDL decreased,  triglycerides elevated at 181. Continue rosuvastatin  20 mg before bed.  Recommend continue to work on eating healthy diet and exercise.  Orders:   POCT Lipid Panel   AMB Referral [facility] Care Management  Benign hypertension Blood pressure well-controlled at 128/72 mmHg on current regimen. Continue valsartan  40 mg daily.  Orders:   AMB Referral [facility] Care Management  Diabetic glomerulopathy (HCC) Cholesterol levels improved. Total cholesterol decreased, LDL decreased, HDL decreased, triglycerides  elevated at 181. Orders:   POCT glycosylated hemoglobin (Hb A1C)   Comprehensive metabolic panel with GFR   Microalbumin / creatinine urine ratio   AMB Referral [facility] Care Management  Idiopathic peripheral neuropathy Controlled Continue current medication regimen. Gabapentin  100 mg by mouth THREE TIMES A DAY.   Orders:   AMB Referral [facility] Care Management   Age-related osteoporosis without current pathological fracture Continue Prolia  60 mg every 6 months, Vitamin D  1000 U daily  Orders:   AMB Referral [facility] Care Management   GAD (generalized anxiety disorder) Depression managed with venlafaxine  and Vraylar. Seeing psychiatrist. Lorazepam  used as needed for anxiety. - Continue venlafaxine  and Vraylar. - Continue lorazepam  0.5 mg three times a day as needed.    Gastroesophageal reflux disease without esophagitis Significant heartburn due to hiatal hernia. Current omeprazole  dose insufficient. - Increase omeprazole  to 40 mg once daily. - Use famotidine  in the evening if experiencing acid reflux.  Orders:   AMB Referral [facility] Care Management  Mild recurrent major depression Depression managed with venlafaxine  and Vraylar. Seeing psychiatrist. Lorazepam  used as needed for anxiety. - Continue venlafaxine  xr 150 mg once daily in am and Vraylar 1.5 mg daily.  - Continue lorazepam  0.5 mg three times a day as needed.  Orders:   AMB Referral [facility] Care Management  Chronic pain  syndrome Pain managed with oxycodone , celebrex  and methocarbamol . Pain worsens by evening. Seeing pain management specialist. Discussed need for medication review. - Refer to pharmacist Jacob for medication review and reconciliation. Orders:   AMB Referral [facility] Care Management   Body mass index is 26.63 kg/m.    Meds ordered this encounter  Medications   omeprazole  (PRILOSEC) 40 MG capsule    Sig: Take 1 capsule (40 mg total) by mouth daily.    Dispense:  90 capsule    Refill:  1    ZERO refills remain on this prescription. Your patient is requesting advance approval of refills for this medication to PREVENT ANY MISSED DOSES    Orders Placed This Encounter  Procedures   Comprehensive metabolic panel with GFR   Microalbumin / creatinine urine ratio   AMB Referral [facility] Care Management   POCT Lipid Panel   POCT glycosylated hemoglobin (Hb A1C)     I,Marla I Leal-Borjas,acting as a scribe for Abigail Free, MD.,have documented all relevant documentation on the behalf of Abigail Free, MD,as directed by  Abigail Free, MD while in the presence of Abigail Free, MD.    Follow-up: Return in about 4 weeks (around 11/21/2023) for awv with Harrie, Dr. Sirivol or me. chronic follow up in 3 months.  An After Visit Summary was printed and given to the patient.  Abigail Free, MD [facility] [phone]

## 2023-10-23 NOTE — Assessment & Plan Note (Addendum)
 Blood pressure well-controlled at 128/72 mmHg on current regimen. Continue valsartan  40 mg daily.  Orders:   AMB Referral [facility] Care Management

## 2023-10-23 NOTE — Assessment & Plan Note (Addendum)
 Cholesterol levels improved. Total cholesterol decreased, LDL decreased, HDL decreased, triglycerides elevated at 181. Continue rosuvastatin  20 mg before bed.  Recommend continue to work on eating healthy diet and exercise.  Orders:   POCT Lipid Panel   AMB Referral [facility] Care Management

## 2023-10-23 NOTE — Assessment & Plan Note (Addendum)
 Significant heartburn due to hiatal hernia. Current omeprazole  dose insufficient. - Increase omeprazole  to 40 mg once daily. - Use famotidine  in the evening if experiencing acid reflux.  Orders:   AMB Referral [facility] Care Management

## 2023-10-23 NOTE — Assessment & Plan Note (Addendum)
 Controlled Continue current medication regimen. Gabapentin  100 mg by mouth THREE TIMES A DAY.   Orders:   AMB Referral [facility] Care Management

## 2023-10-23 NOTE — Assessment & Plan Note (Addendum)
 Continue Prolia  60 mg every 6 months, Vitamin D  1000 U daily  Orders:   AMB Referral [facility] Care Management

## 2023-10-23 NOTE — Assessment & Plan Note (Addendum)
 Depression managed with venlafaxine  and Vraylar. Seeing psychiatrist. Lorazepam  used as needed for anxiety. - Continue venlafaxine  and Vraylar. - Continue lorazepam  0.5 mg three times a day as needed.

## 2023-10-24 ENCOUNTER — Encounter: Payer: Self-pay | Admitting: Family Medicine

## 2023-10-24 ENCOUNTER — Ambulatory Visit: Admitting: Family Medicine

## 2023-10-24 VITALS — BP 128/72 | HR 65 | Temp 97.8°F | Ht 66.0 in | Wt 165.0 lb

## 2023-10-24 DIAGNOSIS — G894 Chronic pain syndrome: Secondary | ICD-10-CM

## 2023-10-24 DIAGNOSIS — F411 Generalized anxiety disorder: Secondary | ICD-10-CM

## 2023-10-24 DIAGNOSIS — I10 Essential (primary) hypertension: Secondary | ICD-10-CM | POA: Diagnosis not present

## 2023-10-24 DIAGNOSIS — F330 Major depressive disorder, recurrent, mild: Secondary | ICD-10-CM

## 2023-10-24 DIAGNOSIS — E1121 Type 2 diabetes mellitus with diabetic nephropathy: Secondary | ICD-10-CM | POA: Diagnosis not present

## 2023-10-24 DIAGNOSIS — E782 Mixed hyperlipidemia: Secondary | ICD-10-CM | POA: Diagnosis not present

## 2023-10-24 DIAGNOSIS — K219 Gastro-esophageal reflux disease without esophagitis: Secondary | ICD-10-CM

## 2023-10-24 DIAGNOSIS — M810 Age-related osteoporosis without current pathological fracture: Secondary | ICD-10-CM | POA: Diagnosis not present

## 2023-10-24 DIAGNOSIS — G609 Hereditary and idiopathic neuropathy, unspecified: Secondary | ICD-10-CM | POA: Diagnosis not present

## 2023-10-24 LAB — POCT GLYCOSYLATED HEMOGLOBIN (HGB A1C): HbA1c POC (<> result, manual entry): 6.3 % (ref 4.0–5.6)

## 2023-10-24 LAB — POCT LIPID PANEL
HDL: 43
LDL: 47
Non-HDL: 83
TC: 126
TRG: 181

## 2023-10-24 MED ORDER — OMEPRAZOLE 40 MG PO CPDR: 40.0000 mg | DELAYED_RELEASE_CAPSULE | Freq: Every day | ORAL | 1 refills | Status: AC

## 2023-10-24 NOTE — Patient Instructions (Signed)
  VISIT SUMMARY: Today, we reviewed your diabetes, hypertension, cholesterol levels, and other health concerns. We made some adjustments to your medications and discussed your ongoing treatments.  YOUR PLAN: GASTROESOPHAGEAL REFLUX DISEASE WITH HIATAL HERNIA: You have significant heartburn due to a hiatal hernia, and your current medication is not enough. -Increase omeprazole  to 40 mg once daily. -Use famotidine  in the evening if you experience acid reflux.  CHRONIC PAIN SYNDROME: Your pain is managed with oxycodone  and methocarbamol , but it worsens by evening. -Refer to pharmacist Jacob for a medication review and reconciliation.  DEPRESSION AND GENERALIZED ANXIETY DISORDER: Your depression is managed with venlafaxine  and Vraylar, and you are seeing a psychiatrist. You use lorazepam  as needed for anxiety. -Continue taking venlafaxine  and Vraylar as prescribed. -Continue taking lorazepam  0.5 mg three times a day as needed.  TYPE 2 DIABETES MELLITUS WITH DIABETIC NEPHROPATHY: Your diabetes is well-controlled with an A1c of 6.3. -Continue your current diabetes management plan.  MIXED HYPERLIPIDEMIA: Your cholesterol levels have improved, but your triglycerides are elevated at 181. -Continue your current lipid management plan.  ESSENTIAL HYPERTENSION: Your blood pressure is well-controlled at 128/72 mmHg on your current regimen. -Continue your current blood pressure management plan.  GENERAL HEALTH MAINTENANCE: You are due for your Medicare annual wellness visit and need to get your flu shot and COVID vaccine. -Schedule your Medicare annual wellness visit within the next month. -Get your flu shot and COVID vaccine when they are available.                      Contains text generated by [employer].                                 Contains text generated by [employer]bridge.

## 2023-10-25 ENCOUNTER — Ambulatory Visit: Payer: Self-pay | Admitting: Family Medicine

## 2023-10-25 LAB — COMPREHENSIVE METABOLIC PANEL WITH GFR
ALT: 11 IU/L (ref 0–32)
AST: 18 IU/L (ref 0–40)
Albumin: 4.3 g/dL (ref 3.8–4.8)
Alkaline Phosphatase: 103 IU/L (ref 49–135)
BUN/Creatinine Ratio: 18 (ref 12–28)
BUN: 13 mg/dL (ref 8–27)
Bilirubin Total: 0.4 mg/dL (ref 0.0–1.2)
CO2: 25 mmol/L (ref 20–29)
Calcium: 9.2 mg/dL (ref 8.7–10.3)
Chloride: 100 mmol/L (ref 96–106)
Creatinine, Ser: 0.71 mg/dL (ref 0.57–1.00)
Globulin, Total: 2.3 g/dL (ref 1.5–4.5)
Glucose: 106 mg/dL — ABNORMAL HIGH (ref 70–99)
Potassium: 4.6 mmol/L (ref 3.5–5.2)
Sodium: 140 mmol/L (ref 134–144)
Total Protein: 6.6 g/dL (ref 6.0–8.5)
eGFR: 89 mL/min/1.73 (ref >59)

## 2023-10-25 LAB — MICROALBUMIN / CREATININE URINE RATIO
Creatinine, Urine: 85.9 mg/dL
Microalb/Creat Ratio: 10 mg/g{creat} (ref 0–29)
Microalbumin, Urine: 8.4 ug/mL

## 2023-10-26 ENCOUNTER — Telehealth: Payer: Self-pay

## 2023-10-26 DIAGNOSIS — F419 Anxiety disorder, unspecified: Secondary | ICD-10-CM | POA: Diagnosis not present

## 2023-10-26 DIAGNOSIS — S32020D Wedge compression fracture of second lumbar vertebra, subsequent encounter for fracture with routine healing: Secondary | ICD-10-CM | POA: Diagnosis not present

## 2023-10-26 DIAGNOSIS — M8588 Other specified disorders of bone density and structure, other site: Secondary | ICD-10-CM | POA: Diagnosis not present

## 2023-10-26 DIAGNOSIS — R29818 Other symptoms and signs involving the nervous system: Secondary | ICD-10-CM | POA: Diagnosis not present

## 2023-10-26 DIAGNOSIS — G894 Chronic pain syndrome: Secondary | ICD-10-CM | POA: Insufficient documentation

## 2023-10-26 NOTE — Progress Notes (Signed)
 Complex Care Management Note  Care Guide Note 10/26/2023 Name: [name] MRN: [id number] DOB: Jul 05, 1950  [name] is a 73 y.o. year old female who sees Cox, Abigail, MD for primary care. I reached out to Ricka CINDERELLA Gash by phone today to offer complex care management services.  Ms. Isip was given information about Complex Care Management services today including:   The Complex Care Management services include support from the care team which includes your Nurse Care Manager, Clinical Social Worker, or Pharmacist.  The Complex Care Management team is here to help remove barriers to the health concerns and goals most important to you. Complex Care Management services are voluntary, and the patient may decline or stop services at any time by request to their care team member.   Complex Care Management Consent Status: Patient agreed to services and verbal consent obtained.   Follow up plan:  Telephone appointment with complex care management team member scheduled for:  11/02/23 at 3:00 p.m.   Encounter Outcome:  Patient Scheduled  Dreama [facility]  [facility], [facility] VBCI Assistant Direct Dial: [phone]  Fax: [phone]

## 2023-10-26 NOTE — Assessment & Plan Note (Addendum)
 Pain managed with oxycodone , celebrex  and methocarbamol . Pain worsens by evening. Seeing pain management specialist. Discussed need for medication review. - Refer to pharmacist Jacob for medication review and reconciliation. Orders:   AMB Referral VBCI Care Management

## 2023-10-28 ENCOUNTER — Other Ambulatory Visit: Payer: Self-pay | Admitting: Family Medicine

## 2023-10-30 ENCOUNTER — Encounter: Payer: Self-pay | Admitting: Family Medicine

## 2023-10-31 DIAGNOSIS — Z79899 Other long term (current) drug therapy: Secondary | ICD-10-CM | POA: Diagnosis not present

## 2023-11-01 DIAGNOSIS — Z6826 Body mass index (BMI) 26.0-26.9, adult: Secondary | ICD-10-CM | POA: Diagnosis not present

## 2023-11-01 DIAGNOSIS — F5104 Psychophysiologic insomnia: Secondary | ICD-10-CM | POA: Diagnosis not present

## 2023-11-01 DIAGNOSIS — Z79899 Other long term (current) drug therapy: Secondary | ICD-10-CM | POA: Diagnosis not present

## 2023-11-01 DIAGNOSIS — F330 Major depressive disorder, recurrent, mild: Secondary | ICD-10-CM | POA: Diagnosis not present

## 2023-11-01 DIAGNOSIS — F419 Anxiety disorder, unspecified: Secondary | ICD-10-CM | POA: Diagnosis not present

## 2023-11-02 ENCOUNTER — Other Ambulatory Visit

## 2023-11-02 NOTE — Progress Notes (Signed)
   11/02/2023 Name: Virginia Montoya MRN: [id number] DOB: 12-20-1950  Chief Complaint  Patient presents with   Medication Management   Current Meds  Medication Sig   cariprazine (VRAYLAR) 1.5 MG capsule Take 1.5 mg by mouth daily.   celecoxib  (CELEBREX ) 200 MG capsule TAKE 1 CAPSULE(200 MG) BY MOUTH DAILY (Patient taking differently: Take 200 mg by mouth 2 (two) times daily.)   cholecalciferol (VITAMIN D3) 25 MCG (1000 UNIT) tablet Take 1,000 Units by mouth 2 (two) times daily.   gabapentin  (NEURONTIN ) 100 MG capsule Take 100 mg by mouth at bedtime. (Patient taking differently: Take 200 mg by mouth 3 (three) times daily.)   hydrOXYzine (ATARAX) 10 MG tablet Take 10 mg by mouth 2 (two) times daily as needed.   LORazepam  (ATIVAN ) 0.5 MG tablet TAKE 1 TABLET(0.5 MG) BY MOUTH EVERY 8 HOURS AS NEEDED FOR ANXIETY   Magnesium 250 MG CAPS Take 250 mg by mouth daily.   methocarbamol  (ROBAXIN ) 500 MG tablet Take 500 mg by mouth 4 (four) times daily.   Multiple Vitamins-Minerals (PRESERVISION AREDS 2 PO) Take by mouth.   nitroGLYCERIN  (NITROSTAT ) 0.4 MG SL tablet PLACE 1 TABLET UNDER THE TONGUE EVERY 5 MINUTES AS NEEDED FOR CHEST PAIN   omeprazole  (PRILOSEC) 40 MG capsule Take 1 capsule (40 mg total) by mouth daily.   oxyCODONE -acetaminophen  (PERCOCET) 10-325 MG tablet Take 2 tablets by mouth every 6 (six) hours as needed.   PROLIA  60 MG/ML SOSY injection Inject into the skin.   rosuvastatin  (CRESTOR ) 20 MG tablet TAKE 1 TABLET(20 MG) BY MOUTH AT BEDTIME   valsartan  (DIOVAN ) 40 MG tablet TAKE 1 TABLET(40 MG) BY MOUTH DAILY   venlafaxine  XR (EFFEXOR -XR) 150 MG 24 hr capsule TAKE 1 CAPSULE BY MOUTH DAILY   vitamin B-12 (CYANOCOBALAMIN) 1000 MCG tablet Take 1,000 mcg by mouth daily.    Med Management - performed comprehensive review of medications, noted updates from pain management  - hydroxyzine added as PRN medication for moments when patient feels crawling sensation in skin - recommended daily  vitamin d3 for bone health - answered questions related to prescription and OTC medications - provided direct line should patient have any questions; agreed on PRN f/u   Future Appointments  Date Time Provider Department Center  11/22/2023  3:00 PM Teressa Harrie HERO, FNP [facility]  01/31/2024  9:00 AM Cox, Abigail, MD [facility]     Lang Sieve, PharmD, BCGP Clinical Pharmacist  [phone]

## 2023-11-05 ENCOUNTER — Other Ambulatory Visit: Payer: Self-pay | Admitting: Family Medicine

## 2023-11-22 ENCOUNTER — Encounter: Admitting: Family Medicine

## 2023-11-22 DIAGNOSIS — F5104 Psychophysiologic insomnia: Secondary | ICD-10-CM | POA: Diagnosis not present

## 2023-11-22 DIAGNOSIS — Z79899 Other long term (current) drug therapy: Secondary | ICD-10-CM | POA: Diagnosis not present

## 2023-11-22 DIAGNOSIS — Z6826 Body mass index (BMI) 26.0-26.9, adult: Secondary | ICD-10-CM | POA: Diagnosis not present

## 2023-11-22 DIAGNOSIS — R030 Elevated blood-pressure reading, without diagnosis of hypertension: Secondary | ICD-10-CM | POA: Diagnosis not present

## 2023-11-22 DIAGNOSIS — F330 Major depressive disorder, recurrent, mild: Secondary | ICD-10-CM | POA: Diagnosis not present

## 2023-11-22 DIAGNOSIS — F419 Anxiety disorder, unspecified: Secondary | ICD-10-CM | POA: Diagnosis not present

## 2023-11-22 NOTE — Progress Notes (Unsigned)
 No chief complaint on file.    Subjective:   Virginia Montoya is a 73 y.o. female who presents for a Medicare Annual Wellness Visit.  Allergies (verified) Cephalexin, Alendronate , Biaxin [clarithromycin], Ciprofloxacin , Macrobid [nitrofurantoin], and Sulfa  antibiotics   History: Past Medical History:  Diagnosis Date  . Chronic insomnia   . Depression   . Gastroesophageal reflux disease   . Hyperlipidemia   . Osteoporosis   . Precordial chest pain 11/17/2016  . Prediabetes    Past Surgical History:  Procedure Laterality Date  . CHOLECYSTECTOMY  1999  . CORONARY CALCIUM  SCORE/CORONARY CTA  01/2017   Coronary calcium  score is 0.  Normal coronary origin with normal right dominance.  No evidence of CAD.  Most likely noncardiac chest pain.  . IR KYPHO LUMBAR INC FX REDUCE BONE BX UNI/BIL CANNULATION INC/IMAGING  01/14/2023  . IR KYPHO THORACIC WITH BONE BIOPSY  01/14/2023  . SHOULDER SURGERY  2005  . TONSILLECTOMY    . TUBAL LIGATION  1977   Family History  Problem Relation Age of Onset  . Diabetes Mother   . Hyperlipidemia Mother   . Emphysema Father   . Diabetes Maternal Grandmother   . Liver cancer Maternal Grandmother    Social History   Occupational History  . Occupation: Retired  Tobacco Use  . Smoking status: Never  . Smokeless tobacco: Never  Vaping Use  . Vaping status: Never Used  Substance and Sexual Activity  . Alcohol use: No  . Drug use: No  . Sexual activity: Not Currently   Tobacco Counseling Counseling given: Not Answered  SDOH Screenings   Food Insecurity: No Food Insecurity (06/29/2023)  Housing: Low Risk  (06/29/2023)  Transportation Needs: No Transportation Needs (06/29/2023)  Utilities: Not At Risk (06/29/2023)  Alcohol Screen: Low Risk  (06/29/2023)  Depression (PHQ2-9): Low Risk  (10/30/2023)  Financial Resource Strain: Low Risk  (06/29/2023)  Physical Activity: Insufficiently Active (06/29/2023)  Social Connections: Moderately Integrated  (06/29/2023)  Stress: No Stress Concern Present (06/29/2023)  Tobacco Use: Low Risk  (10/30/2023)  Health Literacy: Adequate Health Literacy (06/29/2023)   See flowsheets for full screening details  Depression Screen PHQ 2 & 9 Depression Scale- Over the past 2 weeks, how often have you been bothered by any of the following problems? Little interest or pleasure in doing things: 0 Feeling down, depressed, or hopeless (PHQ Adolescent also includes...irritable): 0 PHQ-2 Total Score: 0 Trouble falling or staying asleep, or sleeping too much: 0 Feeling tired or having little energy: 0 Poor appetite or overeating (PHQ Adolescent also includes...weight loss): 0 Feeling bad about yourself - or that you are a failure or have let yourself or your family down: 0 Trouble concentrating on things, such as reading the newspaper or watching television (PHQ Adolescent also includes...like school work): 0 Moving or speaking so slowly that other people could have noticed. Or the opposite - being so fidgety or restless that you have been moving around a lot more than usual: 0 Thoughts that you would be better off dead, or of hurting yourself in some way: 0 PHQ-9 Total Score: 0 If you checked off any problems, how difficult have these problems made it for you to do your work, take care of things at home, or get along with other people?: Not difficult at all  Depression Treatment Depression Interventions/Treatment : Currently on Treatment     Goals Addressed   None    Fall Screening Falls in the past year?: 0 Number of  falls in past year: 0 Was there an injury with Fall?: 0 Fall Risk Category Calculator: 0 Patient Fall Risk Level: Low Fall Risk  Fall Risk Patient at Risk for Falls Due to: No Fall Risks Fall risk Follow up: Falls evaluation completed  Advance Directives (For Healthcare) Does Patient Have a Medical Advance Directive?: Yes Does patient want to make changes to medical advance  directive?: No - Patient declined Type of Advance Directive: Healthcare Power of [place]; Living will Copy of Healthcare Power of Attorney in Chart?: No - copy requested        Objective:    There were no vitals filed for this visit. There is no height or weight on file to calculate BMI.  Current Medications (verified) Outpatient Encounter Medications as of 11/22/2023  Medication Sig  . aspirin  EC 81 MG tablet Take 1 tablet (81 mg total) by mouth daily. Swallow whole.  . cariprazine (VRAYLAR) 1.5 MG capsule Take 1.5 mg by mouth daily.  . celecoxib  (CELEBREX ) 200 MG capsule TAKE 1 CAPSULE(200 MG) BY MOUTH DAILY (Patient taking differently: Take 200 mg by mouth 2 (two) times daily.)  . cholecalciferol (VITAMIN D3) 25 MCG (1000 UNIT) tablet Take 1,000 Units by mouth 2 (two) times daily.  . diclofenac  Sodium (VOLTAREN ) 1 % GEL 4 gms four times a day as needed hip pain  . famotidine  (PEPCID ) 20 MG tablet TAKE 1 TABLET(20 MG) BY MOUTH TWICE DAILY (Patient not taking: Reported on 11/02/2023)  . fluticasone  (FLONASE ) 50 MCG/ACT nasal spray SHAKE LIQUID AND USE 2 SPRAYS IN EACH NOSTRIL DAILY  . gabapentin  (NEURONTIN ) 100 MG capsule Take 100 mg by mouth at bedtime. (Patient taking differently: Take 200 mg by mouth 3 (three) times daily.)  . hydrOXYzine (ATARAX) 10 MG tablet Take 10 mg by mouth 2 (two) times daily as needed.  . LORazepam  (ATIVAN ) 0.5 MG tablet TAKE 1 TABLET(0.5 MG) BY MOUTH EVERY 8 HOURS AS NEEDED FOR ANXIETY  . Magnesium 250 MG CAPS Take 250 mg by mouth daily.  . methocarbamol  (ROBAXIN ) 500 MG tablet Take 500 mg by mouth 4 (four) times daily.  . Multiple Vitamins-Minerals (PRESERVISION AREDS 2 PO) Take by mouth.  . nitroGLYCERIN  (NITROSTAT ) 0.4 MG SL tablet PLACE 1 TABLET UNDER THE TONGUE EVERY 5 MINUTES AS NEEDED FOR CHEST PAIN  . omeprazole  (PRILOSEC) 40 MG capsule Take 1 capsule (40 mg total) by mouth daily.  . oxyCODONE -acetaminophen  (PERCOCET) 10-325 MG tablet Take 2  tablets by mouth every 6 (six) hours as needed.  . PROLIA  60 MG/ML SOSY injection Inject into the skin.  . rosuvastatin  (CRESTOR ) 20 MG tablet TAKE 1 TABLET(20 MG) BY MOUTH AT BEDTIME  . valsartan  (DIOVAN ) 40 MG tablet TAKE 1 TABLET(40 MG) BY MOUTH DAILY  . venlafaxine  XR (EFFEXOR -XR) 150 MG 24 hr capsule TAKE 1 CAPSULE BY MOUTH DAILY  . vitamin B-12 (CYANOCOBALAMIN) 1000 MCG tablet Take 1,000 mcg by mouth daily.   No facility-administered encounter medications on file as of 11/22/2023.   Hearing/Vision screen No results found. Immunizations and Health Maintenance Health Maintenance  Topic Date Due  . OPHTHALMOLOGY EXAM  01/08/2023  . COVID-19 Vaccine (6 - 2025-26 season) 09/05/2023  . Fecal DNA (Cologuard)  04/20/2024  . HEMOGLOBIN A1C  04/23/2024  . FOOT EXAM  06/28/2024  . Diabetic kidney evaluation - eGFR measurement  10/23/2024  . Diabetic kidney evaluation - Urine ACR  10/23/2024  . Medicare Annual Wellness (AWV)  11/21/2024  . DTaP/Tdap/Td (2 - Tdap) 10/24/2026  . Pneumococcal  Vaccine: 50+ Years  Completed  . Influenza Vaccine  Completed  . DEXA SCAN  Completed  . Hepatitis C Screening  Completed  . Zoster Vaccines- Shingrix  Completed  . Meningococcal B Vaccine  Aged Out  . Mammogram  Discontinued        Assessment/Plan:  This is a routine wellness examination for Virginia Montoya.  Patient Care Team: Sherre Clapper, MD as PCP - General (Family Medicine) Erasmo Bernardino BRAVO, OD (Optometry)  I have personally reviewed and noted the following in the patient's chart:   Medical and social history Use of alcohol, tobacco or illicit drugs  Current medications and supplements including opioid prescriptions. Functional ability and status Nutritional status Physical activity Advanced directives List of other physicians Hospitalizations, surgeries, and ER visits in previous 12 months Vitals Screenings to include cognitive, depression, and falls Referrals and appointments  No orders  of the defined types were placed in this encounter.  In addition, I have reviewed and discussed with patient certain preventive protocols, quality metrics, and best practice recommendations. A written personalized care plan for preventive services as well as general preventive health recommendations were provided to patient.   Jon Birmingham, CMA   11/22/2023   Return in 1 year (on 11/21/2024).  After Visit Summary: {CHL AMB AWV After Visit Summary:[phone]}  Nurse Notes: ***

## 2023-11-23 NOTE — Progress Notes (Deleted)
 This encounter was created in error - please disregard.

## 2023-11-23 NOTE — Progress Notes (Unsigned)
 This encounter was created in error - please disregard.

## 2023-11-24 DIAGNOSIS — H353221 Exudative age-related macular degeneration, left eye, with active choroidal neovascularization: Secondary | ICD-10-CM | POA: Diagnosis not present

## 2023-11-25 DIAGNOSIS — Z6826 Body mass index (BMI) 26.0-26.9, adult: Secondary | ICD-10-CM | POA: Diagnosis not present

## 2023-11-25 DIAGNOSIS — R030 Elevated blood-pressure reading, without diagnosis of hypertension: Secondary | ICD-10-CM | POA: Diagnosis not present

## 2023-11-25 DIAGNOSIS — F419 Anxiety disorder, unspecified: Secondary | ICD-10-CM | POA: Diagnosis not present

## 2023-11-25 DIAGNOSIS — Z79899 Other long term (current) drug therapy: Secondary | ICD-10-CM | POA: Diagnosis not present

## 2023-11-25 DIAGNOSIS — Z0000 Encounter for general adult medical examination without abnormal findings: Secondary | ICD-10-CM | POA: Diagnosis not present

## 2023-11-25 DIAGNOSIS — S32020D Wedge compression fracture of second lumbar vertebra, subsequent encounter for fracture with routine healing: Secondary | ICD-10-CM | POA: Diagnosis not present

## 2023-11-25 DIAGNOSIS — R29818 Other symptoms and signs involving the nervous system: Secondary | ICD-10-CM | POA: Diagnosis not present

## 2023-11-25 DIAGNOSIS — I10 Essential (primary) hypertension: Secondary | ICD-10-CM | POA: Diagnosis not present

## 2023-11-25 DIAGNOSIS — M8588 Other specified disorders of bone density and structure, other site: Secondary | ICD-10-CM | POA: Diagnosis not present

## 2023-12-13 DIAGNOSIS — Z6827 Body mass index (BMI) 27.0-27.9, adult: Secondary | ICD-10-CM | POA: Diagnosis not present

## 2023-12-13 DIAGNOSIS — Z6826 Body mass index (BMI) 26.0-26.9, adult: Secondary | ICD-10-CM | POA: Diagnosis not present

## 2023-12-13 DIAGNOSIS — K5900 Constipation, unspecified: Secondary | ICD-10-CM | POA: Diagnosis not present

## 2023-12-13 DIAGNOSIS — F5104 Psychophysiologic insomnia: Secondary | ICD-10-CM | POA: Diagnosis not present

## 2023-12-13 DIAGNOSIS — F419 Anxiety disorder, unspecified: Secondary | ICD-10-CM | POA: Diagnosis not present

## 2023-12-13 DIAGNOSIS — R030 Elevated blood-pressure reading, without diagnosis of hypertension: Secondary | ICD-10-CM | POA: Diagnosis not present

## 2023-12-13 DIAGNOSIS — F330 Major depressive disorder, recurrent, mild: Secondary | ICD-10-CM | POA: Diagnosis not present

## 2024-01-04 ENCOUNTER — Encounter: Payer: Self-pay | Admitting: Family Medicine

## 2024-01-31 ENCOUNTER — Ambulatory Visit: Admitting: Family Medicine

## 2024-02-01 ENCOUNTER — Other Ambulatory Visit (HOSPITAL_COMMUNITY): Payer: Self-pay | Admitting: Family Medicine

## 2024-02-01 ENCOUNTER — Other Ambulatory Visit: Payer: Self-pay

## 2024-02-01 NOTE — Progress Notes (Signed)
 Received referral for Prolia   Last dose: 08/05/23 Next dose due: 02/05/24  Calcium  on 10/24/23 wnl  Lorane Dross, PharmD [facility] Clinical Pharmacist

## 2024-02-02 ENCOUNTER — Other Ambulatory Visit (HOSPITAL_COMMUNITY): Payer: Self-pay

## 2024-02-02 ENCOUNTER — Telehealth: Payer: Self-pay

## 2024-02-02 ENCOUNTER — Encounter: Payer: Self-pay | Admitting: Family Medicine

## 2024-02-02 ENCOUNTER — Telehealth: Payer: Self-pay | Admitting: Pharmacy Technician

## 2024-02-02 NOTE — Telephone Encounter (Signed)
 Pt ready for scheduling for PROLIA  on or after : 02/05/24  Option# 1: Buy/Bill (Office supplied medication)  Out-of-pocket cost due at time of clinic visit: $352  Number of injection/visits approved: ---  Primary: [employer] Prolia  co-insurance: 20% Admin fee co-insurance: 0%  Secondary: --- Prolia  co-insurance:  Admin fee co-insurance:   Medical Benefit Details: Date Benefits were checked: 02/02/24 Deductible: NO/ Coinsurance: 20%/ Admin Fee: 0%  Prior Auth: N/A PA# Expiration Date:   # of doses approved: ----------------------------------------------------------------------- Option# 2- Med Obtained from pharmacy: [facility] PREFERRED FOR PHARMACY BENEFIT  Pharmacy benefit: Copay $564.01 (Paid to pharmacy) Admin Fee: 0% (Pay at clinic)  Prior Auth: N/A PA# Expiration Date:   # of doses approved:   If patient wants fill through the pharmacy benefit please send prescription to: [facility], and include estimated need by date in rx notes. Pharmacy will ship medication directly to the office.  Patient NOT eligible for Prolia  Copay Card. Copay Card can make patient's cost as little as $25. Link to apply: [email or web address]  ** This summary of benefits is an estimation of the patient's out-of-pocket cost. Exact cost may very based on individual plan coverage.

## 2024-02-02 NOTE — Telephone Encounter (Signed)
 Prolia  VOB initiated via [email or web address]  Next Prolia  inj DUE: 02/05/24

## 2024-02-02 NOTE — Telephone Encounter (Signed)
 Auth Submission: NO AUTH NEEDED Site of care: Site of care: [facility] Payer: [employer] Medication & CPT/J Code(s) submitted: Prolia  (Denosumab ) [id number] Diagnosis Code: M81.0 Route of submission (phone, fax, portal):  Phone # Fax # Auth type: Buy/Bill PB Units/visits requested: 60MG  Q6 MONTHS X 2 DOSES Reference number:  Approval from: 02/02/24 to 01/03/25

## 2024-02-03 ENCOUNTER — Encounter: Payer: Self-pay | Admitting: *Deleted

## 2024-04-04 ENCOUNTER — Ambulatory Visit: Admitting: Family Medicine
